# Patient Record
Sex: Female | Born: 1935 | Race: White | Hispanic: No | Marital: Married | Smoking: Never smoker
Health system: Southern US, Community
[De-identification: ages and names within clinical notes are randomized; demographics above are authoritative.]

## PROBLEM LIST (undated history)

## (undated) DIAGNOSIS — W19XXXA Unspecified fall, initial encounter: Secondary | ICD-10-CM

## (undated) DIAGNOSIS — S52209A Unspecified fracture of shaft of unspecified ulna, initial encounter for closed fracture: Secondary | ICD-10-CM

## (undated) DIAGNOSIS — M179 Osteoarthritis of knee, unspecified: Secondary | ICD-10-CM

## (undated) DIAGNOSIS — S62102A Fracture of unspecified carpal bone, left wrist, initial encounter for closed fracture: Secondary | ICD-10-CM

## (undated) DIAGNOSIS — M1710 Unilateral primary osteoarthritis, unspecified knee: Secondary | ICD-10-CM

## (undated) DIAGNOSIS — M4800 Spinal stenosis, site unspecified: Secondary | ICD-10-CM

## (undated) DIAGNOSIS — M419 Scoliosis, unspecified: Secondary | ICD-10-CM

## (undated) DIAGNOSIS — M7582 Other shoulder lesions, left shoulder: Secondary | ICD-10-CM

## (undated) DIAGNOSIS — Y92009 Unspecified place in unspecified non-institutional (private) residence as the place of occurrence of the external cause: Secondary | ICD-10-CM

## (undated) DIAGNOSIS — J449 Chronic obstructive pulmonary disease, unspecified: Secondary | ICD-10-CM

## (undated) DIAGNOSIS — S300XXA Contusion of lower back and pelvis, initial encounter: Secondary | ICD-10-CM

## (undated) HISTORY — PX: TONSILLECTOMY: SUR1361

## (undated) HISTORY — DX: Spinal stenosis, site unspecified: M48.00

## (undated) HISTORY — PX: NASAL SINUS SURGERY: SHX719

## (undated) HISTORY — DX: Unilateral primary osteoarthritis, unspecified knee: M17.10

## (undated) HISTORY — DX: Scoliosis, unspecified: M41.9

## (undated) HISTORY — DX: Chronic obstructive pulmonary disease, unspecified: J44.9

## (undated) HISTORY — PX: CATARACT EXTRACTION: SUR2

## (undated) HISTORY — DX: Osteoarthritis of knee, unspecified: M17.9

---

## 1999-03-06 HISTORY — PX: TOTAL KNEE ARTHROPLASTY: SHX125

## 2003-03-06 HISTORY — PX: APPENDECTOMY: SHX54

## 2011-04-06 DIAGNOSIS — J449 Chronic obstructive pulmonary disease, unspecified: Secondary | ICD-10-CM | POA: Diagnosis not present

## 2011-10-10 DIAGNOSIS — H04129 Dry eye syndrome of unspecified lacrimal gland: Secondary | ICD-10-CM | POA: Diagnosis not present

## 2011-10-10 DIAGNOSIS — Z961 Presence of intraocular lens: Secondary | ICD-10-CM | POA: Diagnosis not present

## 2011-10-10 DIAGNOSIS — H43819 Vitreous degeneration, unspecified eye: Secondary | ICD-10-CM | POA: Diagnosis not present

## 2011-10-31 DIAGNOSIS — E785 Hyperlipidemia, unspecified: Secondary | ICD-10-CM | POA: Diagnosis not present

## 2011-10-31 DIAGNOSIS — R269 Unspecified abnormalities of gait and mobility: Secondary | ICD-10-CM | POA: Diagnosis not present

## 2011-10-31 DIAGNOSIS — Z0000 Encounter for general adult medical examination without abnormal findings: Secondary | ICD-10-CM | POA: Diagnosis not present

## 2011-10-31 DIAGNOSIS — J309 Allergic rhinitis, unspecified: Secondary | ICD-10-CM | POA: Diagnosis not present

## 2011-10-31 DIAGNOSIS — M48061 Spinal stenosis, lumbar region without neurogenic claudication: Secondary | ICD-10-CM | POA: Diagnosis not present

## 2011-10-31 DIAGNOSIS — J45909 Unspecified asthma, uncomplicated: Secondary | ICD-10-CM | POA: Diagnosis not present

## 2011-11-07 DIAGNOSIS — M810 Age-related osteoporosis without current pathological fracture: Secondary | ICD-10-CM | POA: Diagnosis not present

## 2011-11-09 DIAGNOSIS — H6120 Impacted cerumen, unspecified ear: Secondary | ICD-10-CM | POA: Diagnosis not present

## 2011-11-09 DIAGNOSIS — M810 Age-related osteoporosis without current pathological fracture: Secondary | ICD-10-CM | POA: Diagnosis not present

## 2011-11-23 DIAGNOSIS — M79609 Pain in unspecified limb: Secondary | ICD-10-CM | POA: Diagnosis not present

## 2011-11-23 DIAGNOSIS — S93609A Unspecified sprain of unspecified foot, initial encounter: Secondary | ICD-10-CM | POA: Diagnosis not present

## 2011-12-07 DIAGNOSIS — Z23 Encounter for immunization: Secondary | ICD-10-CM | POA: Diagnosis not present

## 2012-05-07 DIAGNOSIS — J309 Allergic rhinitis, unspecified: Secondary | ICD-10-CM | POA: Diagnosis not present

## 2012-05-07 DIAGNOSIS — E785 Hyperlipidemia, unspecified: Secondary | ICD-10-CM | POA: Diagnosis not present

## 2012-05-07 DIAGNOSIS — J45909 Unspecified asthma, uncomplicated: Secondary | ICD-10-CM | POA: Diagnosis not present

## 2012-05-07 DIAGNOSIS — R1310 Dysphagia, unspecified: Secondary | ICD-10-CM | POA: Diagnosis not present

## 2012-05-14 DIAGNOSIS — R1310 Dysphagia, unspecified: Secondary | ICD-10-CM | POA: Diagnosis not present

## 2012-08-29 DIAGNOSIS — M4120 Other idiopathic scoliosis, site unspecified: Secondary | ICD-10-CM | POA: Diagnosis not present

## 2012-08-29 DIAGNOSIS — R269 Unspecified abnormalities of gait and mobility: Secondary | ICD-10-CM | POA: Diagnosis not present

## 2012-08-29 DIAGNOSIS — M48062 Spinal stenosis, lumbar region with neurogenic claudication: Secondary | ICD-10-CM | POA: Diagnosis not present

## 2012-08-29 DIAGNOSIS — M47817 Spondylosis without myelopathy or radiculopathy, lumbosacral region: Secondary | ICD-10-CM | POA: Diagnosis not present

## 2012-08-29 DIAGNOSIS — M545 Low back pain, unspecified: Secondary | ICD-10-CM | POA: Diagnosis not present

## 2012-09-14 DIAGNOSIS — S62309A Unspecified fracture of unspecified metacarpal bone, initial encounter for closed fracture: Secondary | ICD-10-CM | POA: Diagnosis not present

## 2012-09-14 DIAGNOSIS — S62329A Displaced fracture of shaft of unspecified metacarpal bone, initial encounter for closed fracture: Secondary | ICD-10-CM | POA: Diagnosis not present

## 2012-09-18 DIAGNOSIS — M25549 Pain in joints of unspecified hand: Secondary | ICD-10-CM | POA: Diagnosis not present

## 2012-09-25 DIAGNOSIS — M25549 Pain in joints of unspecified hand: Secondary | ICD-10-CM | POA: Diagnosis not present

## 2012-09-29 DIAGNOSIS — S62329A Displaced fracture of shaft of unspecified metacarpal bone, initial encounter for closed fracture: Secondary | ICD-10-CM | POA: Diagnosis not present

## 2012-09-29 DIAGNOSIS — IMO0001 Reserved for inherently not codable concepts without codable children: Secondary | ICD-10-CM | POA: Diagnosis not present

## 2012-10-02 DIAGNOSIS — S62329A Displaced fracture of shaft of unspecified metacarpal bone, initial encounter for closed fracture: Secondary | ICD-10-CM | POA: Diagnosis not present

## 2012-10-15 DIAGNOSIS — H43819 Vitreous degeneration, unspecified eye: Secondary | ICD-10-CM | POA: Diagnosis not present

## 2012-10-15 DIAGNOSIS — H04129 Dry eye syndrome of unspecified lacrimal gland: Secondary | ICD-10-CM | POA: Diagnosis not present

## 2012-10-15 DIAGNOSIS — Z961 Presence of intraocular lens: Secondary | ICD-10-CM | POA: Diagnosis not present

## 2012-10-24 DIAGNOSIS — Z4789 Encounter for other orthopedic aftercare: Secondary | ICD-10-CM | POA: Diagnosis not present

## 2012-10-24 DIAGNOSIS — S62329A Displaced fracture of shaft of unspecified metacarpal bone, initial encounter for closed fracture: Secondary | ICD-10-CM | POA: Diagnosis not present

## 2012-10-29 DIAGNOSIS — Z4789 Encounter for other orthopedic aftercare: Secondary | ICD-10-CM | POA: Diagnosis not present

## 2012-11-04 DIAGNOSIS — G4700 Insomnia, unspecified: Secondary | ICD-10-CM | POA: Diagnosis not present

## 2012-11-04 DIAGNOSIS — M1990 Unspecified osteoarthritis, unspecified site: Secondary | ICD-10-CM | POA: Diagnosis not present

## 2012-11-04 DIAGNOSIS — R269 Unspecified abnormalities of gait and mobility: Secondary | ICD-10-CM | POA: Diagnosis not present

## 2012-11-04 DIAGNOSIS — E785 Hyperlipidemia, unspecified: Secondary | ICD-10-CM | POA: Diagnosis not present

## 2012-11-04 DIAGNOSIS — J45909 Unspecified asthma, uncomplicated: Secondary | ICD-10-CM | POA: Diagnosis not present

## 2012-11-04 DIAGNOSIS — K224 Dyskinesia of esophagus: Secondary | ICD-10-CM | POA: Diagnosis not present

## 2012-11-04 DIAGNOSIS — Z0000 Encounter for general adult medical examination without abnormal findings: Secondary | ICD-10-CM | POA: Diagnosis not present

## 2012-11-04 DIAGNOSIS — J309 Allergic rhinitis, unspecified: Secondary | ICD-10-CM | POA: Diagnosis not present

## 2012-11-04 DIAGNOSIS — M549 Dorsalgia, unspecified: Secondary | ICD-10-CM | POA: Diagnosis not present

## 2012-11-06 DIAGNOSIS — M25569 Pain in unspecified knee: Secondary | ICD-10-CM | POA: Diagnosis not present

## 2012-11-06 DIAGNOSIS — Z471 Aftercare following joint replacement surgery: Secondary | ICD-10-CM | POA: Diagnosis not present

## 2012-11-06 DIAGNOSIS — T8489XA Other specified complication of internal orthopedic prosthetic devices, implants and grafts, initial encounter: Secondary | ICD-10-CM | POA: Diagnosis not present

## 2012-11-06 DIAGNOSIS — Z96659 Presence of unspecified artificial knee joint: Secondary | ICD-10-CM | POA: Diagnosis not present

## 2012-11-12 DIAGNOSIS — Z23 Encounter for immunization: Secondary | ICD-10-CM | POA: Diagnosis not present

## 2012-11-25 DIAGNOSIS — R269 Unspecified abnormalities of gait and mobility: Secondary | ICD-10-CM | POA: Diagnosis not present

## 2012-11-25 DIAGNOSIS — IMO0001 Reserved for inherently not codable concepts without codable children: Secondary | ICD-10-CM | POA: Diagnosis not present

## 2012-11-27 DIAGNOSIS — IMO0001 Reserved for inherently not codable concepts without codable children: Secondary | ICD-10-CM | POA: Diagnosis not present

## 2012-11-27 DIAGNOSIS — R269 Unspecified abnormalities of gait and mobility: Secondary | ICD-10-CM | POA: Diagnosis not present

## 2012-12-01 DIAGNOSIS — IMO0001 Reserved for inherently not codable concepts without codable children: Secondary | ICD-10-CM | POA: Diagnosis not present

## 2012-12-01 DIAGNOSIS — R269 Unspecified abnormalities of gait and mobility: Secondary | ICD-10-CM | POA: Diagnosis not present

## 2012-12-04 DIAGNOSIS — R269 Unspecified abnormalities of gait and mobility: Secondary | ICD-10-CM | POA: Diagnosis not present

## 2012-12-04 DIAGNOSIS — IMO0001 Reserved for inherently not codable concepts without codable children: Secondary | ICD-10-CM | POA: Diagnosis not present

## 2012-12-16 DIAGNOSIS — R269 Unspecified abnormalities of gait and mobility: Secondary | ICD-10-CM | POA: Diagnosis not present

## 2012-12-16 DIAGNOSIS — IMO0001 Reserved for inherently not codable concepts without codable children: Secondary | ICD-10-CM | POA: Diagnosis not present

## 2012-12-18 DIAGNOSIS — M25559 Pain in unspecified hip: Secondary | ICD-10-CM | POA: Diagnosis not present

## 2012-12-18 DIAGNOSIS — R269 Unspecified abnormalities of gait and mobility: Secondary | ICD-10-CM | POA: Diagnosis not present

## 2012-12-18 DIAGNOSIS — M6281 Muscle weakness (generalized): Secondary | ICD-10-CM | POA: Diagnosis not present

## 2012-12-18 DIAGNOSIS — M169 Osteoarthritis of hip, unspecified: Secondary | ICD-10-CM | POA: Diagnosis not present

## 2012-12-18 DIAGNOSIS — IMO0001 Reserved for inherently not codable concepts without codable children: Secondary | ICD-10-CM | POA: Diagnosis not present

## 2012-12-18 DIAGNOSIS — M1610 Unilateral primary osteoarthritis, unspecified hip: Secondary | ICD-10-CM | POA: Diagnosis not present

## 2012-12-23 DIAGNOSIS — R269 Unspecified abnormalities of gait and mobility: Secondary | ICD-10-CM | POA: Diagnosis not present

## 2012-12-23 DIAGNOSIS — IMO0001 Reserved for inherently not codable concepts without codable children: Secondary | ICD-10-CM | POA: Diagnosis not present

## 2012-12-25 DIAGNOSIS — R269 Unspecified abnormalities of gait and mobility: Secondary | ICD-10-CM | POA: Diagnosis not present

## 2012-12-25 DIAGNOSIS — IMO0001 Reserved for inherently not codable concepts without codable children: Secondary | ICD-10-CM | POA: Diagnosis not present

## 2012-12-29 DIAGNOSIS — R269 Unspecified abnormalities of gait and mobility: Secondary | ICD-10-CM | POA: Diagnosis not present

## 2012-12-29 DIAGNOSIS — IMO0001 Reserved for inherently not codable concepts without codable children: Secondary | ICD-10-CM | POA: Diagnosis not present

## 2013-01-01 DIAGNOSIS — IMO0001 Reserved for inherently not codable concepts without codable children: Secondary | ICD-10-CM | POA: Diagnosis not present

## 2013-01-01 DIAGNOSIS — R269 Unspecified abnormalities of gait and mobility: Secondary | ICD-10-CM | POA: Diagnosis not present

## 2013-01-02 DIAGNOSIS — M6281 Muscle weakness (generalized): Secondary | ICD-10-CM | POA: Diagnosis not present

## 2013-01-02 DIAGNOSIS — M5126 Other intervertebral disc displacement, lumbar region: Secondary | ICD-10-CM | POA: Diagnosis not present

## 2013-01-02 DIAGNOSIS — M6250 Muscle wasting and atrophy, not elsewhere classified, unspecified site: Secondary | ICD-10-CM | POA: Diagnosis not present

## 2013-01-02 DIAGNOSIS — M519 Unspecified thoracic, thoracolumbar and lumbosacral intervertebral disc disorder: Secondary | ICD-10-CM | POA: Diagnosis not present

## 2013-01-05 DIAGNOSIS — R269 Unspecified abnormalities of gait and mobility: Secondary | ICD-10-CM | POA: Diagnosis not present

## 2013-01-05 DIAGNOSIS — IMO0001 Reserved for inherently not codable concepts without codable children: Secondary | ICD-10-CM | POA: Diagnosis not present

## 2013-01-08 DIAGNOSIS — R269 Unspecified abnormalities of gait and mobility: Secondary | ICD-10-CM | POA: Diagnosis not present

## 2013-01-08 DIAGNOSIS — IMO0001 Reserved for inherently not codable concepts without codable children: Secondary | ICD-10-CM | POA: Diagnosis not present

## 2013-04-02 DIAGNOSIS — M47817 Spondylosis without myelopathy or radiculopathy, lumbosacral region: Secondary | ICD-10-CM | POA: Diagnosis not present

## 2013-04-02 DIAGNOSIS — M533 Sacrococcygeal disorders, not elsewhere classified: Secondary | ICD-10-CM | POA: Diagnosis not present

## 2013-04-02 DIAGNOSIS — IMO0002 Reserved for concepts with insufficient information to code with codable children: Secondary | ICD-10-CM | POA: Diagnosis not present

## 2013-04-02 DIAGNOSIS — J449 Chronic obstructive pulmonary disease, unspecified: Secondary | ICD-10-CM | POA: Diagnosis not present

## 2013-04-02 DIAGNOSIS — M4120 Other idiopathic scoliosis, site unspecified: Secondary | ICD-10-CM | POA: Diagnosis not present

## 2013-04-02 DIAGNOSIS — R269 Unspecified abnormalities of gait and mobility: Secondary | ICD-10-CM | POA: Diagnosis not present

## 2013-06-17 DIAGNOSIS — M549 Dorsalgia, unspecified: Secondary | ICD-10-CM | POA: Diagnosis not present

## 2013-06-17 DIAGNOSIS — R269 Unspecified abnormalities of gait and mobility: Secondary | ICD-10-CM | POA: Diagnosis not present

## 2013-06-17 DIAGNOSIS — M1990 Unspecified osteoarthritis, unspecified site: Secondary | ICD-10-CM | POA: Diagnosis not present

## 2013-06-17 DIAGNOSIS — E785 Hyperlipidemia, unspecified: Secondary | ICD-10-CM | POA: Diagnosis not present

## 2013-06-24 DIAGNOSIS — H6120 Impacted cerumen, unspecified ear: Secondary | ICD-10-CM | POA: Diagnosis not present

## 2013-06-24 DIAGNOSIS — H905 Unspecified sensorineural hearing loss: Secondary | ICD-10-CM | POA: Diagnosis not present

## 2013-10-22 ENCOUNTER — Ambulatory Visit (INDEPENDENT_AMBULATORY_CARE_PROVIDER_SITE_OTHER): Payer: Medicare Other | Admitting: Physician Assistant

## 2013-10-22 ENCOUNTER — Encounter: Payer: Self-pay | Admitting: Physician Assistant

## 2013-10-22 VITALS — BP 118/78 | HR 75 | Temp 98.2°F | Resp 14 | Ht 62.0 in | Wt 166.0 lb

## 2013-10-22 DIAGNOSIS — M949 Disorder of cartilage, unspecified: Secondary | ICD-10-CM

## 2013-10-22 DIAGNOSIS — M899 Disorder of bone, unspecified: Secondary | ICD-10-CM | POA: Diagnosis not present

## 2013-10-22 DIAGNOSIS — R269 Unspecified abnormalities of gait and mobility: Secondary | ICD-10-CM | POA: Diagnosis not present

## 2013-10-22 DIAGNOSIS — R2681 Unsteadiness on feet: Secondary | ICD-10-CM

## 2013-10-22 DIAGNOSIS — J449 Chronic obstructive pulmonary disease, unspecified: Secondary | ICD-10-CM | POA: Diagnosis not present

## 2013-10-22 DIAGNOSIS — M8580 Other specified disorders of bone density and structure, unspecified site: Secondary | ICD-10-CM

## 2013-10-22 NOTE — Progress Notes (Signed)
Pre visit review using our clinic review tool, if applicable. No additional management support is needed unless otherwise documented below in the visit note/SLS  

## 2013-10-22 NOTE — Progress Notes (Signed)
Patient presents to clinic today to establish care.  Chronic Issues: COPD, mild -- Advair inhaler use twice daily. Has albuterol at home.  Does not require it. Was previously followed by Pulmonology in [place].  Does not wish to see specialist at present.  Osteopenia -- Currently on Calcium-D supplementation.  Is due for DEXA scan. Wishes to hold off until her physical in September.  Patient also taking a glucosamine-chondroitin supplement for joint health with good results.   Gait Instability -- chronic.  Patient has had workup in the past.  Instability is due to significant Scoliosis and leg weakness due to mild spinal stenosis.  Denies vision changes, dizziness or lightheadedness.  Denies history of stroke or heart attack.  Was about to restart PT in [place] before their move.  Is requesting a PT evaluation her at [facility].  Health Maintenance: Dental -- up-to-date Vision -- up-to-date.  Will refer to Dr. Bing Plume here in the area. Immunizations -- Will obtain records from previous PMD as patient is unsure. Colonoscopy -- Due in 2015.   Mammogram -- Last in 2013.   Bone Density --  Overdue. Declines order today.   Past Medical History  Diagnosis Date  . COPD (chronic obstructive pulmonary disease)     Mild  . Arthritis, rheumatoid     Spine  . Spinal stenosis   . Scoliosis   . DJD (degenerative joint disease) of knee     Past Surgical History  Procedure Laterality Date  . Total knee arthroplasty  2001    Left  . Appendectomy  2005  . Cataract extraction      Bilateral  . Nasal sinus surgery    . Tonsillectomy      No current outpatient prescriptions on file prior to visit.   No current facility-administered medications on file prior to visit.    Allergies  Allergen Reactions  . Amoxicillin Nausea And Vomiting  . Ibuprofen Swelling    Mouth Swelling     Family History  Problem Relation Age of Onset  . Heart disease Mother 62    Deceased  . Diabetes Mother    . Heart disease Father 77    Deceased  . Asthma Father   . Heart disease Maternal Uncle   . Colon cancer Brother   . Diabetes Brother     #2  . Heart disease Brother     x2  . Healthy Sister   . Healthy Brother     x1  . Hyperlipidemia Son     x2    History   Social History  . Marital Status: Married    Spouse Name: N/A    Number of Children: N/A  . Years of Education: N/A   Occupational History  . Not on file.   Social History Main Topics  . Smoking status: Never Smoker   . Smokeless tobacco: Not on file  . Alcohol Use: Not on file  . Drug Use: Not on file  . Sexual Activity: Not on file   Other Topics Concern  . Not on file   Social History Narrative  . No narrative on file   ROS See HPI.  All other ROS are negative.  BP 118/78  Pulse 75  Temp(Src) 98.2 F (36.8 C) (Oral)  Resp 14  Ht 5\' 2"  (1.575 m)  Wt 166 lb (75.297 kg)  BMI 30.35 kg/m2  SpO2 97%  Physical Exam  Vitals reviewed. Constitutional: She is oriented to person, place, and time and well-developed,  well-nourished, and in no distress.  HENT:  Head: Normocephalic and atraumatic.  Right Ear: External ear normal.  Left Ear: External ear normal.  Nose: Nose normal.  Mouth/Throat: Oropharynx is clear and moist. No oropharyngeal exudate.  TM within normal limits bilaterally.  Eyes: Conjunctivae are normal.  Neck: Neck supple. No thyromegaly present.  Cardiovascular: Normal rate, regular rhythm, normal heart sounds and intact distal pulses.   Pulmonary/Chest: Effort normal and breath sounds normal. No respiratory distress. She has no wheezes. She has no rales. She exhibits no tenderness.  Lymphadenopathy:    She has no cervical adenopathy.  Neurological: She is alert and oriented to person, place, and time.  Weakness noted on lower extremities with ambulation. Strength 3/5 bilaterally.  Skin: Skin is warm and dry. No rash noted.   Assessment/Plan: Gait instability Will refer downstairs  for continued PT.   Osteopenia Continue supplements.  Patient due for DEXA but declines at present.  COPD with chronic bronchitis Continue current medication regimen.  Patient to reconsider Pulmonology referral.

## 2013-10-22 NOTE — Patient Instructions (Signed)
Please continue medications as directed.  We will obtain your records from your previous Primary Medical Doctor.  You will be contacted by [facility] for an appointment.  Please follow-up with me in September for your Annual Exam.

## 2013-10-27 ENCOUNTER — Telehealth: Payer: Self-pay | Admitting: Physician Assistant

## 2013-10-27 NOTE — Telephone Encounter (Signed)
Received medical records from [facility] in [place], [place]

## 2013-11-02 DIAGNOSIS — J449 Chronic obstructive pulmonary disease, unspecified: Secondary | ICD-10-CM | POA: Insufficient documentation

## 2013-11-02 DIAGNOSIS — R2681 Unsteadiness on feet: Secondary | ICD-10-CM | POA: Insufficient documentation

## 2013-11-02 DIAGNOSIS — M8580 Other specified disorders of bone density and structure, unspecified site: Secondary | ICD-10-CM | POA: Insufficient documentation

## 2013-11-02 NOTE — Assessment & Plan Note (Signed)
Continue supplements.  Patient due for DEXA but declines at present.

## 2013-11-02 NOTE — Assessment & Plan Note (Signed)
Continue current medication regimen.  Patient to reconsider Pulmonology referral.

## 2013-11-02 NOTE — Assessment & Plan Note (Signed)
Will refer downstairs for continued PT.

## 2013-11-04 ENCOUNTER — Telehealth: Payer: Self-pay | Admitting: Physician Assistant

## 2013-11-04 NOTE — Telephone Encounter (Signed)
Records received from [facility] clinic

## 2013-11-24 ENCOUNTER — Telehealth: Payer: Self-pay

## 2013-11-24 DIAGNOSIS — R5383 Other fatigue: Secondary | ICD-10-CM

## 2013-11-24 DIAGNOSIS — E785 Hyperlipidemia, unspecified: Secondary | ICD-10-CM

## 2013-11-24 DIAGNOSIS — Z299 Encounter for prophylactic measures, unspecified: Secondary | ICD-10-CM

## 2013-11-24 DIAGNOSIS — J42 Unspecified chronic bronchitis: Secondary | ICD-10-CM

## 2013-11-24 NOTE — Telephone Encounter (Signed)
This patient is scheduled for labs tomorrow. What would like ordered?

## 2013-11-24 NOTE — Telephone Encounter (Signed)
Orders placed above. Please make sure the lab appointment is scheduled.

## 2013-11-25 ENCOUNTER — Other Ambulatory Visit (INDEPENDENT_AMBULATORY_CARE_PROVIDER_SITE_OTHER): Payer: Medicare Other

## 2013-11-25 DIAGNOSIS — J42 Unspecified chronic bronchitis: Secondary | ICD-10-CM

## 2013-11-25 DIAGNOSIS — Z299 Encounter for prophylactic measures, unspecified: Secondary | ICD-10-CM | POA: Diagnosis not present

## 2013-11-25 DIAGNOSIS — R5381 Other malaise: Secondary | ICD-10-CM | POA: Diagnosis not present

## 2013-11-25 DIAGNOSIS — R5383 Other fatigue: Secondary | ICD-10-CM | POA: Diagnosis not present

## 2013-11-25 DIAGNOSIS — Z79899 Other long term (current) drug therapy: Secondary | ICD-10-CM | POA: Diagnosis not present

## 2013-11-25 DIAGNOSIS — E785 Hyperlipidemia, unspecified: Secondary | ICD-10-CM | POA: Diagnosis not present

## 2013-11-25 LAB — CBC
HCT: 42.4 % (ref 36.0–46.0)
HEMOGLOBIN: 14.3 g/dL (ref 12.0–15.0)
MCHC: 33.8 g/dL (ref 30.0–36.0)
MCV: 89.7 fl (ref 78.0–100.0)
Platelets: 286 10*3/uL (ref 150.0–400.0)
RBC: 4.73 Mil/uL (ref 3.87–5.11)
RDW: 13.7 % (ref 11.5–15.5)
WBC: 5.5 10*3/uL (ref 4.0–10.5)

## 2013-11-25 LAB — TSH: TSH: 1.15 u[IU]/mL (ref 0.35–4.50)

## 2013-11-25 LAB — BASIC METABOLIC PANEL
BUN: 19 mg/dL (ref 6–23)
CO2: 30 mEq/L (ref 19–32)
Calcium: 9.5 mg/dL (ref 8.4–10.5)
Chloride: 100 mEq/L (ref 96–112)
Creatinine, Ser: 0.6 mg/dL (ref 0.4–1.2)
GFR: 98.65 mL/min (ref >60.00)
Glucose, Bld: 79 mg/dL (ref 70–99)
Potassium: 3.9 mEq/L (ref 3.5–5.1)
SODIUM: 135 meq/L (ref 135–145)

## 2013-11-25 LAB — HEPATIC FUNCTION PANEL
ALK PHOS: 79 U/L (ref 39–117)
ALT: 16 U/L (ref 0–35)
AST: 25 U/L (ref 0–37)
Albumin: 3.8 g/dL (ref 3.5–5.2)
Bilirubin, Direct: 0 mg/dL (ref 0.0–0.3)
Total Bilirubin: 0.2 mg/dL (ref 0.2–1.2)
Total Protein: 7.8 g/dL (ref 6.0–8.3)

## 2013-11-25 LAB — LIPID PANEL
Cholesterol: 224 mg/dL — ABNORMAL HIGH (ref 0–200)
HDL: 48.8 mg/dL (ref >39.00)
LDL Cholesterol: 153 mg/dL — ABNORMAL HIGH (ref 0–99)
NonHDL: 175.2
TRIGLYCERIDES: 109 mg/dL (ref 0.0–149.0)
Total CHOL/HDL Ratio: 5
VLDL: 21.8 mg/dL (ref 0.0–40.0)

## 2013-11-25 LAB — VITAMIN D 25 HYDROXY (VIT D DEFICIENCY, FRACTURES): VITD: 56 ng/mL (ref 30.00–100.00)

## 2013-11-25 LAB — HEMOGLOBIN A1C: HEMOGLOBIN A1C: 5.6 % (ref 4.6–6.5)

## 2013-11-25 NOTE — Telephone Encounter (Signed)
Patient seen today

## 2013-11-26 ENCOUNTER — Telehealth: Payer: Self-pay | Admitting: Physician Assistant

## 2013-11-26 LAB — URINALYSIS W MICROSCOPIC + REFLEX CULTURE
BACTERIA UA: NONE SEEN
Bilirubin Urine: NEGATIVE
CRYSTALS: NONE SEEN
Casts: NONE SEEN
Glucose, UA: NEGATIVE mg/dL
HGB URINE DIPSTICK: NEGATIVE
KETONES UR: NEGATIVE mg/dL
NITRITE: NEGATIVE
Protein, ur: NEGATIVE mg/dL
SPECIFIC GRAVITY, URINE: 1.015 (ref 1.005–1.030)
Squamous Epithelial / LPF: NONE SEEN
UROBILINOGEN UA: 0.2 mg/dL (ref 0.0–1.0)
pH: 7 (ref 5.0–8.0)

## 2013-11-26 NOTE — Telephone Encounter (Signed)
Will discuss lab results at CPE.  However, urine shows evidence of infection. Patient without symptoms at appointment..  Please send urine for culture.  Call patient to assess for symptoms of UTI.  If present, let me know so empiric antibiotics can be started.

## 2013-11-26 NOTE — Telephone Encounter (Signed)
Patient states that she is asymptomatic. No frequency,burning,pain or discoloration. Patient will call back if she notices anything.

## 2013-11-29 LAB — URINE CULTURE

## 2013-11-30 ENCOUNTER — Encounter: Payer: Self-pay | Admitting: Physician Assistant

## 2013-11-30 ENCOUNTER — Telehealth: Payer: Self-pay | Admitting: Physician Assistant

## 2013-11-30 ENCOUNTER — Ambulatory Visit (INDEPENDENT_AMBULATORY_CARE_PROVIDER_SITE_OTHER): Payer: Medicare Other | Admitting: Physician Assistant

## 2013-11-30 VITALS — BP 125/54 | HR 78 | Temp 98.3°F | Resp 16 | Ht 62.0 in | Wt 165.4 lb

## 2013-11-30 DIAGNOSIS — R2681 Unsteadiness on feet: Secondary | ICD-10-CM

## 2013-11-30 DIAGNOSIS — Z136 Encounter for screening for cardiovascular disorders: Secondary | ICD-10-CM | POA: Diagnosis not present

## 2013-11-30 DIAGNOSIS — Z0000 Encounter for general adult medical examination without abnormal findings: Secondary | ICD-10-CM

## 2013-11-30 DIAGNOSIS — M8580 Other specified disorders of bone density and structure, unspecified site: Secondary | ICD-10-CM

## 2013-11-30 DIAGNOSIS — Z23 Encounter for immunization: Secondary | ICD-10-CM | POA: Diagnosis not present

## 2013-11-30 DIAGNOSIS — M899 Disorder of bone, unspecified: Secondary | ICD-10-CM

## 2013-11-30 DIAGNOSIS — Z1211 Encounter for screening for malignant neoplasm of colon: Secondary | ICD-10-CM

## 2013-11-30 DIAGNOSIS — Z1239 Encounter for other screening for malignant neoplasm of breast: Secondary | ICD-10-CM

## 2013-11-30 DIAGNOSIS — M949 Disorder of cartilage, unspecified: Secondary | ICD-10-CM

## 2013-11-30 DIAGNOSIS — N3000 Acute cystitis without hematuria: Secondary | ICD-10-CM

## 2013-11-30 DIAGNOSIS — R269 Unspecified abnormalities of gait and mobility: Secondary | ICD-10-CM

## 2013-11-30 MED ORDER — LEVOFLOXACIN 250 MG PO TABS: 250.0000 mg | ORAL_TABLET | Freq: Every day | ORAL | Status: DC

## 2013-11-30 NOTE — Addendum Note (Signed)
Addended by: Rockwell Germany on: 11/30/2013 04:40 PM   Modules accepted: Orders

## 2013-11-30 NOTE — Progress Notes (Signed)
Subjective:   Patient here for Medicare annual wellness visit and management of other chronic and acute problems.      COPD, mild -- Advair inhaler use twice daily. Has albuterol at home. Does not require it. Was previously followed by Pulmonology in [place]. Does not wish to see specialist at present.  Osteopenia -- Currently on Calcium-D supplementation. Is due for DEXA scan.   Gait Instability -- chronic. Patient has had workup in the past. Instability is due to significant Scoliosis and leg weakness due to mild spinal stenosis. Denies vision changes, dizziness or lightheadedness. Denies history of stroke or heart attack. Is currently undergoing PT here at the [facility] with improvement in gait.  Risk factors: Age, COPD, Osteopenia  Roster of Physicians Providing Medical Care to Patient: (1) Brunetta Jeans, PA-C -- Primary Care (2) Dr. Bing Plume -- Opthalmology   Activities of Daily Living  In your present state of health, do you have any difficulty performing the following activities? Preparing food and eating?: No  Bathing yourself: No  Getting dressed: No  Using the toilet:No  Moving around from place to place: No  In the past year have you fallen or had a near fall?:No     Home Safety: Has smoke detector and wears seat belts. No firearms. No excess sun exposure.  Diet and Exercise  Current exercise habits: Water aerobics 3-4 times per week. Dietary issues discussed: healthy diet   Health Maintenance:  Dental -- up-to-date  Vision -- up-to-date. Sees [facility]. Immunizations -- Will obtain records from previous PMD as patient is unsure.  Colonoscopy -- Family history of CRC.  Will refer to GI. Mammogram -- Last in 2013.  Will place order for screening mammogram. Bone Density -- Overdue. Order placed.  Depression Screen  (Note: if answer to either of the following is "Yes", then a more complete depression screening is indicated)  Q1: Over the past two weeks,  have you felt down, depressed or hopeless? no  Q2: Over the past two weeks, have you felt little interest or pleasure in doing things? no   The following portions of the patient's history were reviewed and updated as appropriate: allergies, current medications, past family history, past medical history, past social history, past surgical history and problem list.   Review of Systems  Constitutional: Negative for fever and weight loss.  HENT: Negative for ear discharge, ear pain, hearing loss and tinnitus.   Respiratory: Negative for cough and shortness of breath.   Cardiovascular: Negative for chest pain.  Gastrointestinal: Negative for heartburn, nausea, vomiting, abdominal pain, diarrhea, constipation, blood in stool and melena.  Genitourinary: Negative for dysuria, urgency, frequency, hematuria and flank pain.  Neurological: Negative for headaches.  Psychiatric/Behavioral: Negative for depression. The patient is not nervous/anxious.    Objective:   Vision: L 20/25, R 20/25, B 20/15 uncorrected Hearing: Patient can hear a forced whisper from across the room. Body mass index: Body mass index is 30.24 kg/(m^2). Cognitive Impairment Assessment: cognition, memory and judgment appear normal.   Physical Exam  Vitals reviewed. Constitutional: She is oriented to person, place, and time and well-developed, well-nourished, and in no distress.  HENT:  Head: Normocephalic and atraumatic.  Right Ear: External ear normal.  Left Ear: External ear normal.  Nose: Nose normal.  Mouth/Throat: Oropharynx is clear and moist. No oropharyngeal exudate.  Eyes: Conjunctivae are normal. Pupils are equal, round, and reactive to light.  Neck: Neck supple. No thyromegaly present.  Cardiovascular: Normal rate, regular rhythm,  normal heart sounds and intact distal pulses.   Pulmonary/Chest: Effort normal and breath sounds normal. No respiratory distress. She has no wheezes. She has no rales. She exhibits no  tenderness.  Abdominal: Soft. Bowel sounds are normal. She exhibits no distension. There is no tenderness.  Lymphadenopathy:    She has no cervical adenopathy.  Neurological: She is alert and oriented to person, place, and time.  Skin: Skin is warm and dry. No rash noted.  Psychiatric: Affect normal.   Assessment:   (1) Medicare wellness utd on preventive parameters (2) COPD (3) Gait Instability (4) Osteopenia (5) Screening for Ischemic Heart Disease (6) Colon Cancer Screening (7) Breast Cancer Screening  Plan:   (1) During the course of the visit the patient was educated and counseled about appropriate screening and preventive services including: Fall prevention, Screening mammography, Bone densitometry screening, Diabetes screening, Nutrition counseling. Patient received a high-dose influenza vaccine at today's visit.  (2) Well-controlled. Lung exam within normal limits.  Continue current regimen.  (3) Improved with Water Aerobics.  New referral placed to PT for gait-strengthening exercises.  (4) Continue Calcium supplement.  Order placed for Bone Density test.  (5) EKG obtained revealing NSR.  (6) Order placed for screening colonoscopy giving + family history of CRC.   (7) Referral placed to GI for screening colonoscopy as concerning polyp noted 3 years ago.  Patient Instructions (the written plan) was given to the patient.

## 2013-11-30 NOTE — Addendum Note (Signed)
Addended by: Raiford Noble on: 11/30/2013 12:04 PM   Modules accepted: Orders

## 2013-11-30 NOTE — Progress Notes (Signed)
Pre visit review using our clinic review tool, if applicable. No additional management support is needed unless otherwise documented below in the visit note/SLS  

## 2013-11-30 NOTE — Patient Instructions (Signed)
Continue medications as directed. You will be contacted for Physical Therapy, Bone Density scanning, mammogram and colonoscopy giving your family history.  Follow-up with me in 6 months for a cholesterol recheck.  Please make sure to limit your intake of saturated fats and total cholesterol.  Continue water aerobics 3-4 times per week.  Preventive Care for Adults A healthy lifestyle and preventive care can promote health and wellness. Preventive health guidelines for women include the following key practices.  A routine yearly physical is a good way to check with your health care provider about your health and preventive screening. It is a chance to share any concerns and updates on your health and to receive a thorough exam.  Visit your dentist for a routine exam and preventive care every 6 months. Brush your teeth twice a day and floss once a day. Good oral hygiene prevents tooth decay and gum disease.  The frequency of eye exams is based on your age, health, family medical history, use of contact lenses, and other factors. Follow your health care provider's recommendations for frequency of eye exams.  Eat a healthy diet. Foods like vegetables, fruits, whole grains, low-fat dairy products, and lean protein foods contain the nutrients you need without too many calories. Decrease your intake of foods high in solid fats, added sugars, and salt. Eat the right amount of calories for you.Get information about a proper diet from your health care provider, if necessary.  Regular physical exercise is one of the most important things you can do for your health. Most adults should get at least 150 minutes of moderate-intensity exercise (any activity that increases your heart rate and causes you to sweat) each week. In addition, most adults need muscle-strengthening exercises on 2 or more days a week.  Maintain a healthy weight. The body mass index (BMI) is a screening tool to identify possible weight  problems. It provides an estimate of body fat based on height and weight. Your health care provider can find your BMI and can help you achieve or maintain a healthy weight.For adults 20 years and older:  A BMI below 18.5 is considered underweight.  A BMI of 18.5 to 24.9 is normal.  A BMI of 25 to 29.9 is considered overweight.  A BMI of 30 and above is considered obese.  Maintain normal blood lipids and cholesterol levels by exercising and minimizing your intake of saturated fat. Eat a balanced diet with plenty of fruit and vegetables. Blood tests for lipids and cholesterol should begin at age 41 and be repeated every 5 years. If your lipid or cholesterol levels are high, you are over 50, or you are at high risk for heart disease, you may need your cholesterol levels checked more frequently.Ongoing high lipid and cholesterol levels should be treated with medicines if diet and exercise are not working.  If you smoke, find out from your health care provider how to quit. If you do not use tobacco, do not start.  Lung cancer screening is recommended for adults aged 54-80 years who are at high risk for developing lung cancer because of a history of smoking. A yearly low-dose CT scan of the lungs is recommended for people who have at least a 30-pack-year history of smoking and are a current smoker or have quit within the past 15 years. A pack year of smoking is smoking an average of 1 pack of cigarettes a day for 1 year (for example: 1 pack a day for 30 years or 2  packs a day for 15 years). Yearly screening should continue until the smoker has stopped smoking for at least 15 years. Yearly screening should be stopped for people who develop a health problem that would prevent them from having lung cancer treatment.  If you are pregnant, do not drink alcohol. If you are breastfeeding, be very cautious about drinking alcohol. If you are not pregnant and choose to drink alcohol, do not have more than 1 drink  per day. One drink is considered to be 12 ounces (355 mL) of beer, 5 ounces (148 mL) of wine, or 1.5 ounces (44 mL) of liquor.  Avoid use of street drugs. Do not share needles with anyone. Ask for help if you need support or instructions about stopping the use of drugs.  High blood pressure causes heart disease and increases the risk of stroke. Your blood pressure should be checked at least every 1 to 2 years. Ongoing high blood pressure should be treated with medicines if weight loss and exercise do not work.  If you are 39-21 years old, ask your health care provider if you should take aspirin to prevent strokes.  Diabetes screening involves taking a blood sample to check your fasting blood sugar level. This should be done once every 3 years, after age 73, if you are within normal weight and without risk factors for diabetes. Testing should be considered at a younger age or be carried out more frequently if you are overweight and have at least 1 risk factor for diabetes.  Breast cancer screening is essential preventive care for women. You should practice "breast self-awareness." This means understanding the normal appearance and feel of your breasts and may include breast self-examination. Any changes detected, no matter how small, should be reported to a health care provider. Women in their 17s and 30s should have a clinical breast exam (CBE) by a health care provider as part of a regular health exam every 1 to 3 years. After age 54, women should have a CBE every year. Starting at age 80, women should consider having a mammogram (breast X-ray test) every year. Women who have a family history of breast cancer should talk to their health care provider about genetic screening. Women at a high risk of breast cancer should talk to their health care providers about having an MRI and a mammogram every year.  Breast cancer gene (BRCA)-related cancer risk assessment is recommended for women who have family  members with BRCA-related cancers. BRCA-related cancers include breast, ovarian, tubal, and peritoneal cancers. Having family members with these cancers may be associated with an increased risk for harmful changes (mutations) in the breast cancer genes BRCA1 and BRCA2. Results of the assessment will determine the need for genetic counseling and BRCA1 and BRCA2 testing.  Routine pelvic exams to screen for cancer are no longer recommended for nonpregnant women who are considered low risk for cancer of the pelvic organs (ovaries, uterus, and vagina) and who do not have symptoms. Ask your health care provider if a screening pelvic exam is right for you.  If you have had past treatment for cervical cancer or a condition that could lead to cancer, you need Pap tests and screening for cancer for at least 20 years after your treatment. If Pap tests have been discontinued, your risk factors (such as having a new sexual partner) need to be reassessed to determine if screening should be resumed. Some women have medical problems that increase the chance of getting cervical cancer. In  these cases, your health care provider may recommend more frequent screening and Pap tests.  The HPV test is an additional test that may be used for cervical cancer screening. The HPV test looks for the virus that can cause the cell changes on the cervix. The cells collected during the Pap test can be tested for HPV. The HPV test could be used to screen women aged 67 years and older, and should be used in women of any age who have unclear Pap test results. After the age of 9, women should have HPV testing at the same frequency as a Pap test.  Colorectal cancer can be detected and often prevented. Most routine colorectal cancer screening begins at the age of 69 years and continues through age 66 years. However, your health care provider may recommend screening at an earlier age if you have risk factors for colon cancer. On a yearly basis,  your health care provider may provide home test kits to check for hidden blood in the stool. Use of a small camera at the end of a tube, to directly examine the colon (sigmoidoscopy or colonoscopy), can detect the earliest forms of colorectal cancer. Talk to your health care provider about this at age 68, when routine screening begins. Direct exam of the colon should be repeated every 5-10 years through age 13 years, unless early forms of pre-cancerous polyps or small growths are found.  People who are at an increased risk for hepatitis B should be screened for this virus. You are considered at high risk for hepatitis B if:  You were born in a country where hepatitis B occurs often. Talk with your health care provider about which countries are considered high risk.  Your parents were born in a high-risk country and you have not received a shot to protect against hepatitis B (hepatitis B vaccine).  You have HIV or AIDS.  You use needles to inject street drugs.  You live with, or have sex with, someone who has hepatitis B.  You get hemodialysis treatment.  You take certain medicines for conditions like cancer, organ transplantation, and autoimmune conditions.  Hepatitis C blood testing is recommended for all people born from [birth year] through 1965 and any individual with known risks for hepatitis C.  Practice safe sex. Use condoms and avoid high-risk sexual practices to reduce the spread of sexually transmitted infections (STIs). STIs include gonorrhea, chlamydia, syphilis, trichomonas, herpes, HPV, and human immunodeficiency virus (HIV). Herpes, HIV, and HPV are viral illnesses that have no cure. They can result in disability, cancer, and death.  You should be screened for sexually transmitted illnesses (STIs) including gonorrhea and chlamydia if:  You are sexually active and are younger than 24 years.  You are older than 24 years and your health care provider tells you that you are at risk for  this type of infection.  Your sexual activity has changed since you were last screened and you are at an increased risk for chlamydia or gonorrhea. Ask your health care provider if you are at risk.  If you are at risk of being infected with HIV, it is recommended that you take a prescription medicine daily to prevent HIV infection. This is called preexposure prophylaxis (PrEP). You are considered at risk if:  You are a heterosexual woman, are sexually active, and are at increased risk for HIV infection.  You take drugs by injection.  You are sexually active with a partner who has HIV.  Talk with your health care  provider about whether you are at high risk of being infected with HIV. If you choose to begin PrEP, you should first be tested for HIV. You should then be tested every 3 months for as long as you are taking PrEP.  Osteoporosis is a disease in which the bones lose minerals and strength with aging. This can result in serious bone fractures or breaks. The risk of osteoporosis can be identified using a bone density scan. Women ages 73 years and over and women at risk for fractures or osteoporosis should discuss screening with their health care providers. Ask your health care provider whether you should take a calcium supplement or vitamin D to reduce the rate of osteoporosis.  Menopause can be associated with physical symptoms and risks. Hormone replacement therapy is available to decrease symptoms and risks. You should talk to your health care provider about whether hormone replacement therapy is right for you.  Use sunscreen. Apply sunscreen liberally and repeatedly throughout the day. You should seek shade when your shadow is shorter than you. Protect yourself by wearing long sleeves, pants, a wide-brimmed hat, and sunglasses year round, whenever you are outdoors.  Once a month, do a whole body skin exam, using a mirror to look at the skin on your back. Tell your health care provider of  new moles, moles that have irregular borders, moles that are larger than a pencil eraser, or moles that have changed in shape or color.  Stay current with required vaccines (immunizations).  Influenza vaccine. All adults should be immunized every year.  Tetanus, diphtheria, and acellular pertussis (Td, Tdap) vaccine. Pregnant women should receive 1 dose of Tdap vaccine during each pregnancy. The dose should be obtained regardless of the length of time since the last dose. Immunization is preferred during the 27th-36th week of gestation. An adult who has not previously received Tdap or who does not know her vaccine status should receive 1 dose of Tdap. This initial dose should be followed by tetanus and diphtheria toxoids (Td) booster doses every 10 years. Adults with an unknown or incomplete history of completing a 3-dose immunization series with Td-containing vaccines should begin or complete a primary immunization series including a Tdap dose. Adults should receive a Td booster every 10 years.  Varicella vaccine. An adult without evidence of immunity to varicella should receive 2 doses or a second dose if she has previously received 1 dose. Pregnant females who do not have evidence of immunity should receive the first dose after pregnancy. This first dose should be obtained before leaving the health care facility. The second dose should be obtained 4-8 weeks after the first dose.  Human papillomavirus (HPV) vaccine. Females aged 13-26 years who have not received the vaccine previously should obtain the 3-dose series. The vaccine is not recommended for use in pregnant females. However, pregnancy testing is not needed before receiving a dose. If a female is found to be pregnant after receiving a dose, no treatment is needed. In that case, the remaining doses should be delayed until after the pregnancy. Immunization is recommended for any person with an immunocompromised condition through the age of 51  years if she did not get any or all doses earlier. During the 3-dose series, the second dose should be obtained 4-8 weeks after the first dose. The third dose should be obtained 24 weeks after the first dose and 16 weeks after the second dose.  Zoster vaccine. One dose is recommended for adults aged 24 years or older  unless certain conditions are present.  Measles, mumps, and rubella (MMR) vaccine. Adults born before [birth year] generally are considered immune to measles and mumps. Adults born in [birth year] or later should have 1 or more doses of MMR vaccine unless there is a contraindication to the vaccine or there is laboratory evidence of immunity to each of the three diseases. A routine second dose of MMR vaccine should be obtained at least 28 days after the first dose for students attending postsecondary schools, health care workers, or international travelers. People who received inactivated measles vaccine or an unknown type of measles vaccine during 1963-1967 should receive 2 doses of MMR vaccine. People who received inactivated mumps vaccine or an unknown type of mumps vaccine before 1979 and are at high risk for mumps infection should consider immunization with 2 doses of MMR vaccine. For females of childbearing age, rubella immunity should be determined. If there is no evidence of immunity, females who are not pregnant should be vaccinated. If there is no evidence of immunity, females who are pregnant should delay immunization until after pregnancy. Unvaccinated health care workers born before [birth year] who lack laboratory evidence of measles, mumps, or rubella immunity or laboratory confirmation of disease should consider measles and mumps immunization with 2 doses of MMR vaccine or rubella immunization with 1 dose of MMR vaccine.  Pneumococcal 13-valent conjugate (PCV13) vaccine. When indicated, a person who is uncertain of her immunization history and has no record of immunization should receive the PCV13 vaccine.  An adult aged 13 years or older who has certain medical conditions and has not been previously immunized should receive 1 dose of PCV13 vaccine. This PCV13 should be followed with a dose of pneumococcal polysaccharide (PPSV23) vaccine. The PPSV23 vaccine dose should be obtained at least 8 weeks after the dose of PCV13 vaccine. An adult aged 35 years or older who has certain medical conditions and previously received 1 or more doses of PPSV23 vaccine should receive 1 dose of PCV13. The PCV13 vaccine dose should be obtained 1 or more years after the last PPSV23 vaccine dose.  Pneumococcal polysaccharide (PPSV23) vaccine. When PCV13 is also indicated, PCV13 should be obtained first. All adults aged 50 years and older should be immunized. An adult younger than age 76 years who has certain medical conditions should be immunized. Any person who resides in a nursing home or long-term care facility should be immunized. An adult smoker should be immunized. People with an immunocompromised condition and certain other conditions should receive both PCV13 and PPSV23 vaccines. People with human immunodeficiency virus (HIV) infection should be immunized as soon as possible after diagnosis. Immunization during chemotherapy or radiation therapy should be avoided. Routine use of PPSV23 vaccine is not recommended for American Indians, [place] Natives, or people younger than 65 years unless there are medical conditions that require PPSV23 vaccine. When indicated, people who have unknown immunization and have no record of immunization should receive PPSV23 vaccine. One-time revaccination 5 years after the first dose of PPSV23 is recommended for people aged 19-64 years who have chronic kidney failure, nephrotic syndrome, asplenia, or immunocompromised conditions. People who received 1-2 doses of PPSV23 before age 82 years should receive another dose of PPSV23 vaccine at age 2 years or later if at least 5 years have passed since the  previous dose. Doses of PPSV23 are not needed for people immunized with PPSV23 at or after age 52 years.  Meningococcal vaccine. Adults with asplenia or persistent complement component deficiencies should receive 2 doses  of quadrivalent meningococcal conjugate (MenACWY-D) vaccine. The doses should be obtained at least 2 months apart. Microbiologists working with certain meningococcal bacteria, [place] recruits, people at risk during an outbreak, and people who travel to or live in countries with a high rate of meningitis should be immunized. A first-year college student up through age 3 years who is living in a residence hall should receive a dose if she did not receive a dose on or after her 16th birthday. Adults who have certain high-risk conditions should receive one or more doses of vaccine.  Hepatitis A vaccine. Adults who wish to be protected from this disease, have certain high-risk conditions, work with hepatitis A-infected animals, work in hepatitis A research labs, or travel to or work in countries with a high rate of hepatitis A should be immunized. Adults who were previously unvaccinated and who anticipate close contact with an international adoptee during the first 60 days after arrival in the [place] from a country with a high rate of hepatitis A should be immunized.  Hepatitis B vaccine. Adults who wish to be protected from this disease, have certain high-risk conditions, may be exposed to blood or other infectious body fluids, are household contacts or sex partners of hepatitis B positive people, are clients or workers in certain care facilities, or travel to or work in countries with a high rate of hepatitis B should be immunized.  Haemophilus influenzae type b (Hib) vaccine. A previously unvaccinated person with asplenia or sickle cell disease or having a scheduled splenectomy should receive 1 dose of Hib vaccine. Regardless of previous immunization, a recipient of a hematopoietic  stem cell transplant should receive a 3-dose series 6-12 months after her successful transplant. Hib vaccine is not recommended for adults with HIV infection. Preventive Services / Frequency Ages 46 to 24 years  Blood pressure check.** / Every 1 to 2 years.  Lipid and cholesterol check.** / Every 5 years beginning at age 73.  Clinical breast exam.** / Every 3 years for women in their 29s and 70s.  BRCA-related cancer risk assessment.** / For women who have family members with a BRCA-related cancer (breast, ovarian, tubal, or peritoneal cancers).  Pap test.** / Every 2 years from ages 13 through 81. Every 3 years starting at age 27 through age 62 or 88 with a history of 3 consecutive normal Pap tests.  HPV screening.** / Every 3 years from ages 13 through ages 15 to 69 with a history of 3 consecutive normal Pap tests.  Hepatitis C blood test.** / For any individual with known risks for hepatitis C.  Skin self-exam. / Monthly.  Influenza vaccine. / Every year.  Tetanus, diphtheria, and acellular pertussis (Tdap, Td) vaccine.** / Consult your health care provider. Pregnant women should receive 1 dose of Tdap vaccine during each pregnancy. 1 dose of Td every 10 years.  Varicella vaccine.** / Consult your health care provider. Pregnant females who do not have evidence of immunity should receive the first dose after pregnancy.  HPV vaccine. / 3 doses over 6 months, if 5 and younger. The vaccine is not recommended for use in pregnant females. However, pregnancy testing is not needed before receiving a dose.  Measles, mumps, rubella (MMR) vaccine.** / You need at least 1 dose of MMR if you were born in 1957 or later. You may also need a 2nd dose. For females of childbearing age, rubella immunity should be determined. If there is no evidence of immunity, females who are not pregnant  should be vaccinated. If there is no evidence of immunity, females who are pregnant should delay immunization until  after pregnancy.  Pneumococcal 13-valent conjugate (PCV13) vaccine.** / Consult your health care provider.  Pneumococcal polysaccharide (PPSV23) vaccine.** / 1 to 2 doses if you smoke cigarettes or if you have certain conditions.  Meningococcal vaccine.** / 1 dose if you are age 66 to 67 years and a Market researcher living in a residence hall, or have one of several medical conditions, you need to get vaccinated against meningococcal disease. You may also need additional booster doses.  Hepatitis A vaccine.** / Consult your health care provider.  Hepatitis B vaccine.** / Consult your health care provider.  Haemophilus influenzae type b (Hib) vaccine.** / Consult your health care provider. Ages 65 to 52 years  Blood pressure check.** / Every 1 to 2 years.  Lipid and cholesterol check.** / Every 5 years beginning at age 30 years.  Lung cancer screening. / Every year if you are aged 40-80 years and have a 30-pack-year history of smoking and currently smoke or have quit within the past 15 years. Yearly screening is stopped once you have quit smoking for at least 15 years or develop a health problem that would prevent you from having lung cancer treatment.  Clinical breast exam.** / Every year after age 27 years.  BRCA-related cancer risk assessment.** / For women who have family members with a BRCA-related cancer (breast, ovarian, tubal, or peritoneal cancers).  Mammogram.** / Every year beginning at age 38 years and continuing for as long as you are in good health. Consult with your health care provider.  Pap test.** / Every 3 years starting at age 64 years through age 70 or 71 years with a history of 3 consecutive normal Pap tests.  HPV screening.** / Every 3 years from ages 63 years through ages 63 to 68 years with a history of 3 consecutive normal Pap tests.  Fecal occult blood test (FOBT) of stool. / Every year beginning at age 65 years and continuing until age 52 years.  You may not need to do this test if you get a colonoscopy every 10 years.  Flexible sigmoidoscopy or colonoscopy.** / Every 5 years for a flexible sigmoidoscopy or every 10 years for a colonoscopy beginning at age 21 years and continuing until age 41 years.  Hepatitis C blood test.** / For all people born from [birth year] through 1963 and any individual with known risks for hepatitis C.  Skin self-exam. / Monthly.  Influenza vaccine. / Every year.  Tetanus, diphtheria, and acellular pertussis (Tdap/Td) vaccine.** / Consult your health care provider. Pregnant women should receive 1 dose of Tdap vaccine during each pregnancy. 1 dose of Td every 10 years.  Varicella vaccine.** / Consult your health care provider. Pregnant females who do not have evidence of immunity should receive the first dose after pregnancy.  Zoster vaccine.** / 1 dose for adults aged 62 years or older.  Measles, mumps, rubella (MMR) vaccine.** / You need at least 1 dose of MMR if you were born in 1957 or later. You may also need a 2nd dose. For females of childbearing age, rubella immunity should be determined. If there is no evidence of immunity, females who are not pregnant should be vaccinated. If there is no evidence of immunity, females who are pregnant should delay immunization until after pregnancy.  Pneumococcal 13-valent conjugate (PCV13) vaccine.** / Consult your health care provider.  Pneumococcal polysaccharide (PPSV23) vaccine.** / 1 to  2 doses if you smoke cigarettes or if you have certain conditions.  Meningococcal vaccine.** / Consult your health care provider.  Hepatitis A vaccine.** / Consult your health care provider.  Hepatitis B vaccine.** / Consult your health care provider.  Haemophilus influenzae type b (Hib) vaccine.** / Consult your health care provider. Ages 7 years and over  Blood pressure check.** / Every 1 to 2 years.  Lipid and cholesterol check.** / Every 5 years beginning at age 47  years.  Lung cancer screening. / Every year if you are aged 24-80 years and have a 30-pack-year history of smoking and currently smoke or have quit within the past 15 years. Yearly screening is stopped once you have quit smoking for at least 15 years or develop a health problem that would prevent you from having lung cancer treatment.  Clinical breast exam.** / Every year after age 16 years.  BRCA-related cancer risk assessment.** / For women who have family members with a BRCA-related cancer (breast, ovarian, tubal, or peritoneal cancers).  Mammogram.** / Every year beginning at age 96 years and continuing for as long as you are in good health. Consult with your health care provider.  Pap test.** / Every 3 years starting at age 36 years through age 50 or 78 years with 3 consecutive normal Pap tests. Testing can be stopped between 65 and 70 years with 3 consecutive normal Pap tests and no abnormal Pap or HPV tests in the past 10 years.  HPV screening.** / Every 3 years from ages 70 years through ages 84 or 46 years with a history of 3 consecutive normal Pap tests. Testing can be stopped between 65 and 70 years with 3 consecutive normal Pap tests and no abnormal Pap or HPV tests in the past 10 years.  Fecal occult blood test (FOBT) of stool. / Every year beginning at age 89 years and continuing until age 62 years. You may not need to do this test if you get a colonoscopy every 10 years.  Flexible sigmoidoscopy or colonoscopy.** / Every 5 years for a flexible sigmoidoscopy or every 10 years for a colonoscopy beginning at age 44 years and continuing until age 76 years.  Hepatitis C blood test.** / For all people born from [birth year] through 1963 and any individual with known risks for hepatitis C.  Osteoporosis screening.** / A one-time screening for women ages 59 years and over and women at risk for fractures or osteoporosis.  Skin self-exam. / Monthly.  Influenza vaccine. / Every year.  Tetanus,  diphtheria, and acellular pertussis (Tdap/Td) vaccine.** / 1 dose of Td every 10 years.  Varicella vaccine.** / Consult your health care provider.  Zoster vaccine.** / 1 dose for adults aged 42 years or older.  Pneumococcal 13-valent conjugate (PCV13) vaccine.** / Consult your health care provider.  Pneumococcal polysaccharide (PPSV23) vaccine.** / 1 dose for all adults aged 71 years and older.  Meningococcal vaccine.** / Consult your health care provider.  Hepatitis A vaccine.** / Consult your health care provider.  Hepatitis B vaccine.** / Consult your health care provider.  Haemophilus influenzae type b (Hib) vaccine.** / Consult your health care provider. ** Family history and personal history of risk and conditions may change your health care provider's recommendations. Document Released: 04/17/2001 Document Revised: 07/06/2013 Document Reviewed: 07/17/2010 [facility] Patient Information 2015 [place], [place]. This information is not intended to replace advice given to you by your health care provider. Make sure you discuss any questions you have with your health  care provider.

## 2013-11-30 NOTE — Telephone Encounter (Signed)
Urine Culture final results are in.  Infection is sensitive to Levaquin.  Will Rx Levaquin 250 mg once daily for 5 days. Sent to her [employer].  Would like for patient to return in 1.5-2 weeks for repeat UA and Urine culture.

## 2013-12-17 MED ORDER — LEVOFLOXACIN 250 MG PO TABS: 250.0000 mg | ORAL_TABLET | Freq: Every day | ORAL | Status: DC

## 2013-12-17 NOTE — Telephone Encounter (Signed)
Called and spoke with patient's husband concerning the start of Levaquin. He voiced understanding. He stated he would like the prescription sent to the pharmacy at [facility]. Medication was sent in and cancelled at [place]. I also informed him that patient would need to return in 1.5-2 weeks for repeat UA. He voiced understanding. JG//CMA

## 2013-12-17 NOTE — Telephone Encounter (Signed)
Patient returning your phone call. Best # [phone]

## 2013-12-17 NOTE — Telephone Encounter (Signed)
Returned call to [phone] and did not get an answer. JG//CMA

## 2013-12-18 ENCOUNTER — Other Ambulatory Visit (INDEPENDENT_AMBULATORY_CARE_PROVIDER_SITE_OTHER): Payer: Medicare Other

## 2013-12-18 DIAGNOSIS — N390 Urinary tract infection, site not specified: Secondary | ICD-10-CM | POA: Diagnosis not present

## 2013-12-18 DIAGNOSIS — R319 Hematuria, unspecified: Secondary | ICD-10-CM | POA: Diagnosis not present

## 2013-12-18 LAB — POCT URINALYSIS DIPSTICK
Bilirubin, UA: NEGATIVE
Blood, UA: NEGATIVE
Glucose, UA: NEGATIVE
Ketones, UA: NEGATIVE
NITRITE UA: NEGATIVE
PROTEIN UA: NEGATIVE
Spec Grav, UA: 1.015
UROBILINOGEN UA: 0.2
pH, UA: 6

## 2013-12-19 LAB — URINE CULTURE: Colony Count: 6000

## 2014-01-08 ENCOUNTER — Telehealth: Payer: Self-pay | Admitting: Physician Assistant

## 2014-01-08 MED ORDER — FLUTICASONE-SALMETEROL 250-50 MCG/DOSE IN AEPB: 1.0000 | INHALATION_SPRAY | Freq: Two times a day (BID) | RESPIRATORY_TRACT | Status: DC

## 2014-01-08 NOTE — Telephone Encounter (Signed)
Rx request to pharmacy/SLS  

## 2014-01-08 NOTE — Telephone Encounter (Signed)
Caller name: Sarina, Robleto Relation to pt: self  Call back number: [phone] Pharmacy: [facility] [phone] ([place])     Reason for call: fluticasone (FLONASE) 50 MCG/ACT nasal spray & Fluticasone-Salmeterol (ADVAIR) 250-50 MCG/DOSE AEPB

## 2014-01-11 ENCOUNTER — Ambulatory Visit: Payer: Medicare Other | Attending: Physician Assistant

## 2014-01-11 DIAGNOSIS — J449 Chronic obstructive pulmonary disease, unspecified: Secondary | ICD-10-CM | POA: Diagnosis not present

## 2014-01-11 DIAGNOSIS — M419 Scoliosis, unspecified: Secondary | ICD-10-CM | POA: Insufficient documentation

## 2014-01-11 DIAGNOSIS — Z96652 Presence of left artificial knee joint: Secondary | ICD-10-CM | POA: Diagnosis not present

## 2014-01-11 DIAGNOSIS — M4800 Spinal stenosis, site unspecified: Secondary | ICD-10-CM | POA: Insufficient documentation

## 2014-01-11 DIAGNOSIS — R2689 Other abnormalities of gait and mobility: Secondary | ICD-10-CM | POA: Diagnosis not present

## 2014-01-11 DIAGNOSIS — M8580 Other specified disorders of bone density and structure, unspecified site: Secondary | ICD-10-CM | POA: Diagnosis not present

## 2014-01-11 DIAGNOSIS — Z5189 Encounter for other specified aftercare: Secondary | ICD-10-CM | POA: Diagnosis not present

## 2014-01-11 DIAGNOSIS — M6281 Muscle weakness (generalized): Secondary | ICD-10-CM | POA: Diagnosis not present

## 2014-01-11 DIAGNOSIS — R293 Abnormal posture: Secondary | ICD-10-CM | POA: Insufficient documentation

## 2014-01-11 DIAGNOSIS — M810 Age-related osteoporosis without current pathological fracture: Secondary | ICD-10-CM | POA: Diagnosis not present

## 2014-01-12 ENCOUNTER — Other Ambulatory Visit: Payer: Self-pay | Admitting: *Deleted

## 2014-01-12 ENCOUNTER — Ambulatory Visit
Admission: RE | Admit: 2014-01-12 | Discharge: 2014-01-12 | Disposition: A | Discharge status: Home / Self Care | Payer: Medicare Other | Source: Ambulatory Visit | Attending: Physician Assistant | Admitting: Physician Assistant

## 2014-01-12 DIAGNOSIS — M8580 Other specified disorders of bone density and structure, unspecified site: Secondary | ICD-10-CM

## 2014-01-12 DIAGNOSIS — Z1239 Encounter for other screening for malignant neoplasm of breast: Secondary | ICD-10-CM

## 2014-01-12 DIAGNOSIS — Z1231 Encounter for screening mammogram for malignant neoplasm of breast: Secondary | ICD-10-CM | POA: Diagnosis not present

## 2014-01-12 DIAGNOSIS — M85832 Other specified disorders of bone density and structure, left forearm: Secondary | ICD-10-CM | POA: Diagnosis not present

## 2014-01-12 DIAGNOSIS — Z780 Asymptomatic menopausal state: Secondary | ICD-10-CM | POA: Diagnosis not present

## 2014-01-12 MED ORDER — FLUTICASONE PROPIONATE 50 MCG/ACT NA SUSP: 2.0000 | Freq: Every day | NASAL | Status: DC

## 2014-01-12 NOTE — Telephone Encounter (Signed)
Rx request to pharmacy/SLS  

## 2014-01-13 ENCOUNTER — Telehealth: Payer: Self-pay | Admitting: Physician Assistant

## 2014-01-13 NOTE — Telephone Encounter (Signed)
Mammogram reveals no concerning findings of malignancy.  Imaging Center is mailing patient a copy of results.  Repeat Mammogram usually recommended in 2 years but giving patient's advanced age, may defer next mammogram.   Bone density scan reveals osteoporosis.  Will continue Citracal two tablets daily.  Add some resistance exercises with a small 2-5 lb weight to help prevent weakening of bones.  Will repeat DEXA scan in 1-2 years.

## 2014-01-13 NOTE — Telephone Encounter (Signed)
Patient informed, understood & agreed/SLS  

## 2014-01-19 ENCOUNTER — Ambulatory Visit: Payer: Medicare Other | Admitting: Rehabilitation

## 2014-01-19 DIAGNOSIS — M419 Scoliosis, unspecified: Secondary | ICD-10-CM | POA: Diagnosis not present

## 2014-01-19 DIAGNOSIS — R293 Abnormal posture: Secondary | ICD-10-CM | POA: Diagnosis not present

## 2014-01-19 DIAGNOSIS — Z5189 Encounter for other specified aftercare: Secondary | ICD-10-CM | POA: Diagnosis not present

## 2014-01-19 DIAGNOSIS — M6281 Muscle weakness (generalized): Secondary | ICD-10-CM | POA: Diagnosis not present

## 2014-01-19 DIAGNOSIS — J449 Chronic obstructive pulmonary disease, unspecified: Secondary | ICD-10-CM | POA: Diagnosis not present

## 2014-01-19 DIAGNOSIS — R2689 Other abnormalities of gait and mobility: Secondary | ICD-10-CM | POA: Diagnosis not present

## 2014-01-21 ENCOUNTER — Ambulatory Visit: Payer: Medicare Other | Admitting: Rehabilitation

## 2014-01-21 DIAGNOSIS — J449 Chronic obstructive pulmonary disease, unspecified: Secondary | ICD-10-CM | POA: Diagnosis not present

## 2014-01-21 DIAGNOSIS — R2689 Other abnormalities of gait and mobility: Secondary | ICD-10-CM | POA: Diagnosis not present

## 2014-01-21 DIAGNOSIS — Z5189 Encounter for other specified aftercare: Secondary | ICD-10-CM | POA: Diagnosis not present

## 2014-01-21 DIAGNOSIS — M6281 Muscle weakness (generalized): Secondary | ICD-10-CM | POA: Diagnosis not present

## 2014-01-21 DIAGNOSIS — M419 Scoliosis, unspecified: Secondary | ICD-10-CM | POA: Diagnosis not present

## 2014-01-21 DIAGNOSIS — R293 Abnormal posture: Secondary | ICD-10-CM | POA: Diagnosis not present

## 2014-01-26 ENCOUNTER — Ambulatory Visit: Payer: Medicare Other | Admitting: Rehabilitation

## 2014-01-26 DIAGNOSIS — M419 Scoliosis, unspecified: Secondary | ICD-10-CM | POA: Diagnosis not present

## 2014-01-26 DIAGNOSIS — M6281 Muscle weakness (generalized): Secondary | ICD-10-CM | POA: Diagnosis not present

## 2014-01-26 DIAGNOSIS — R293 Abnormal posture: Secondary | ICD-10-CM | POA: Diagnosis not present

## 2014-01-26 DIAGNOSIS — R2689 Other abnormalities of gait and mobility: Secondary | ICD-10-CM | POA: Diagnosis not present

## 2014-01-26 DIAGNOSIS — J449 Chronic obstructive pulmonary disease, unspecified: Secondary | ICD-10-CM | POA: Diagnosis not present

## 2014-01-26 DIAGNOSIS — Z5189 Encounter for other specified aftercare: Secondary | ICD-10-CM | POA: Diagnosis not present

## 2014-02-02 ENCOUNTER — Ambulatory Visit: Payer: Medicare Other | Attending: Physician Assistant | Admitting: Rehabilitation

## 2014-02-02 DIAGNOSIS — Z5189 Encounter for other specified aftercare: Secondary | ICD-10-CM | POA: Diagnosis not present

## 2014-02-02 DIAGNOSIS — M8580 Other specified disorders of bone density and structure, unspecified site: Secondary | ICD-10-CM | POA: Diagnosis not present

## 2014-02-02 DIAGNOSIS — M6281 Muscle weakness (generalized): Secondary | ICD-10-CM | POA: Insufficient documentation

## 2014-02-02 DIAGNOSIS — M810 Age-related osteoporosis without current pathological fracture: Secondary | ICD-10-CM | POA: Diagnosis not present

## 2014-02-02 DIAGNOSIS — R2689 Other abnormalities of gait and mobility: Secondary | ICD-10-CM | POA: Diagnosis not present

## 2014-02-02 DIAGNOSIS — M419 Scoliosis, unspecified: Secondary | ICD-10-CM | POA: Insufficient documentation

## 2014-02-02 DIAGNOSIS — Z96652 Presence of left artificial knee joint: Secondary | ICD-10-CM | POA: Diagnosis not present

## 2014-02-02 DIAGNOSIS — J449 Chronic obstructive pulmonary disease, unspecified: Secondary | ICD-10-CM | POA: Diagnosis not present

## 2014-02-02 DIAGNOSIS — R293 Abnormal posture: Secondary | ICD-10-CM | POA: Insufficient documentation

## 2014-02-02 DIAGNOSIS — M4800 Spinal stenosis, site unspecified: Secondary | ICD-10-CM | POA: Diagnosis not present

## 2014-02-04 ENCOUNTER — Ambulatory Visit: Payer: Medicare Other | Admitting: Rehabilitation

## 2014-02-04 DIAGNOSIS — Z5189 Encounter for other specified aftercare: Secondary | ICD-10-CM | POA: Diagnosis not present

## 2014-02-09 ENCOUNTER — Ambulatory Visit: Payer: Medicare Other | Admitting: Rehabilitation

## 2014-02-11 ENCOUNTER — Ambulatory Visit: Payer: Medicare Other | Admitting: Rehabilitation

## 2014-02-17 ENCOUNTER — Ambulatory Visit: Payer: Medicare Other | Admitting: Rehabilitation

## 2014-02-17 DIAGNOSIS — Z5189 Encounter for other specified aftercare: Secondary | ICD-10-CM | POA: Diagnosis not present

## 2014-02-23 ENCOUNTER — Ambulatory Visit: Payer: Medicare Other | Admitting: Rehabilitation

## 2014-02-23 DIAGNOSIS — Z5189 Encounter for other specified aftercare: Secondary | ICD-10-CM | POA: Diagnosis not present

## 2014-03-02 ENCOUNTER — Ambulatory Visit: Payer: Medicare Other | Admitting: Rehabilitation

## 2014-03-02 DIAGNOSIS — Z5189 Encounter for other specified aftercare: Secondary | ICD-10-CM | POA: Diagnosis not present

## 2014-03-03 ENCOUNTER — Ambulatory Visit: Payer: Medicare Other | Admitting: Rehabilitation

## 2014-03-03 DIAGNOSIS — Z5189 Encounter for other specified aftercare: Secondary | ICD-10-CM | POA: Diagnosis not present

## 2014-03-04 ENCOUNTER — Encounter: Payer: Medicare Other | Admitting: Rehabilitation

## 2014-04-05 DIAGNOSIS — Z961 Presence of intraocular lens: Secondary | ICD-10-CM | POA: Diagnosis not present

## 2014-04-05 DIAGNOSIS — H524 Presbyopia: Secondary | ICD-10-CM | POA: Diagnosis not present

## 2014-04-15 ENCOUNTER — Telehealth: Payer: Self-pay | Admitting: Physician Assistant

## 2014-04-15 DIAGNOSIS — J309 Allergic rhinitis, unspecified: Secondary | ICD-10-CM | POA: Insufficient documentation

## 2014-04-15 DIAGNOSIS — J449 Chronic obstructive pulmonary disease, unspecified: Secondary | ICD-10-CM | POA: Insufficient documentation

## 2014-04-15 DIAGNOSIS — S52615A Nondisplaced fracture of left ulna styloid process, initial encounter for closed fracture: Secondary | ICD-10-CM | POA: Diagnosis not present

## 2014-04-15 DIAGNOSIS — S52572A Other intraarticular fracture of lower end of left radius, initial encounter for closed fracture: Secondary | ICD-10-CM | POA: Diagnosis not present

## 2014-04-15 DIAGNOSIS — S6392XA Sprain of unspecified part of left wrist and hand, initial encounter: Secondary | ICD-10-CM | POA: Diagnosis not present

## 2014-04-15 DIAGNOSIS — M1990 Unspecified osteoarthritis, unspecified site: Secondary | ICD-10-CM | POA: Insufficient documentation

## 2014-04-15 DIAGNOSIS — S63502A Unspecified sprain of left wrist, initial encounter: Secondary | ICD-10-CM | POA: Diagnosis not present

## 2014-04-15 NOTE — Telephone Encounter (Signed)
[facility] Day - Client [facility] Patient Name: Tanya Harris DOB: 1932-11-07 Initial Comment Caller states she was seen at the dentist. She fell out of and thinks she might have broken her wrist. Nurse Assessment Nurse: Martyn Ehrich, RN, Felicia Date/Time ([place] Time): 04/15/2014 4:09:36 PM Confirm and document reason for call. If symptomatic, describe symptoms. ---Pt was at the dentist today and just came home. When she was in dental chair she reached over to get something and fell out of chair - only injury is her L wrist and above wrist (swelling). It is swollen and she uses L hand for cane and walker to walk .Has the patient traveled out of the country within the last 30 days? ---No Does the patient require triage? ---Yes Related visit to physician within the last 2 weeks? ---No Does the PT have any chronic conditions? (i.e. diabetes, asthma, etc.) ---Yes List chronic conditions. ---very mild COPD Guidelines Guideline Title Affirmed Question Affirmed Notes Hand and Wrist Injury [1] Large swelling or bruise (> 2 inches or 5 cm) AND [2] can't use injured hand normally (e.g., make a fist, open fully, hold a glass of water) Final Disposition User See Physician within 4 Hours (or PCP triage) Martyn Ehrich, RN, Felicia Comments told them they need to go somewhere with Xray ability, they are deciding and Took UC info from [place] Dr. and will check there and another UC and if they need to will go to [place], [place] is her provider  PLEASE NOTE: All timestamps contained within this report are represented as [place] Standard Time. CONFIDENTIALTY NOTICE: This fax transmission is intended only for the addressee. It contains information that is legally privileged, confidential or otherwise protected from use or disclosure. If you are not the intended recipient, you are strictly prohibited from reviewing, disclosing, copying  using or disseminating any of this information or taking any action in reliance on or regarding this information. If you have received this fax in error, please notify us immediately by telephone so that we can arrange for its return to [place]. Phone: [phone], Toll-Free: [phone], Fax: [phone] Page: 1 of 1 Call Id: [id number]

## 2014-04-16 ENCOUNTER — Other Ambulatory Visit: Payer: Self-pay | Admitting: Physician Assistant

## 2014-04-16 DIAGNOSIS — S60212A Contusion of left wrist, initial encounter: Secondary | ICD-10-CM | POA: Diagnosis not present

## 2014-04-16 DIAGNOSIS — S52592A Other fractures of lower end of left radius, initial encounter for closed fracture: Secondary | ICD-10-CM | POA: Diagnosis not present

## 2014-04-16 DIAGNOSIS — S6392XA Sprain of unspecified part of left wrist and hand, initial encounter: Secondary | ICD-10-CM | POA: Diagnosis not present

## 2014-04-16 DIAGNOSIS — S63502A Unspecified sprain of left wrist, initial encounter: Secondary | ICD-10-CM | POA: Diagnosis not present

## 2014-04-16 DIAGNOSIS — M25632 Stiffness of left wrist, not elsewhere classified: Secondary | ICD-10-CM | POA: Diagnosis not present

## 2014-04-16 DIAGNOSIS — M25532 Pain in left wrist: Secondary | ICD-10-CM | POA: Diagnosis not present

## 2014-04-16 DIAGNOSIS — S5292XA Unspecified fracture of left forearm, initial encounter for closed fracture: Secondary | ICD-10-CM

## 2014-04-16 NOTE — Telephone Encounter (Signed)
Called patient to follow up on wrist.  Patient states she went to urgent care to get x-ray and was told that she would need to see orthopedist to have her left wrist put in a cast.  Notified Elyn Aquas, who placed referral.    Patient and husband notified. Stated understanding.  eal

## 2014-04-20 DIAGNOSIS — S60212D Contusion of left wrist, subsequent encounter: Secondary | ICD-10-CM | POA: Diagnosis not present

## 2014-04-20 DIAGNOSIS — S52592D Other fractures of lower end of left radius, subsequent encounter for closed fracture with routine healing: Secondary | ICD-10-CM | POA: Diagnosis not present

## 2014-05-04 DIAGNOSIS — S52209A Unspecified fracture of shaft of unspecified ulna, initial encounter for closed fracture: Secondary | ICD-10-CM

## 2014-05-04 DIAGNOSIS — S62102A Fracture of unspecified carpal bone, left wrist, initial encounter for closed fracture: Secondary | ICD-10-CM

## 2014-05-04 HISTORY — DX: Fracture of unspecified carpal bone, left wrist, initial encounter for closed fracture: S62.102A

## 2014-05-04 HISTORY — DX: Unspecified fracture of shaft of unspecified ulna, initial encounter for closed fracture: S52.209A

## 2014-05-11 DIAGNOSIS — S52592D Other fractures of lower end of left radius, subsequent encounter for closed fracture with routine healing: Secondary | ICD-10-CM | POA: Diagnosis not present

## 2014-05-21 DIAGNOSIS — S52502A Unspecified fracture of the lower end of left radius, initial encounter for closed fracture: Secondary | ICD-10-CM | POA: Diagnosis not present

## 2014-06-01 ENCOUNTER — Encounter: Payer: Self-pay | Admitting: Physician Assistant

## 2014-06-01 ENCOUNTER — Ambulatory Visit (INDEPENDENT_AMBULATORY_CARE_PROVIDER_SITE_OTHER): Payer: Medicare Other | Admitting: Physician Assistant

## 2014-06-01 VITALS — BP 116/60 | HR 76 | Temp 97.9°F | Resp 16 | Ht 62.0 in | Wt 162.4 lb

## 2014-06-01 DIAGNOSIS — R7989 Other specified abnormal findings of blood chemistry: Secondary | ICD-10-CM | POA: Diagnosis not present

## 2014-06-01 DIAGNOSIS — J449 Chronic obstructive pulmonary disease, unspecified: Secondary | ICD-10-CM

## 2014-06-01 DIAGNOSIS — M8580 Other specified disorders of bone density and structure, unspecified site: Secondary | ICD-10-CM | POA: Diagnosis not present

## 2014-06-01 DIAGNOSIS — Z23 Encounter for immunization: Secondary | ICD-10-CM

## 2014-06-01 DIAGNOSIS — M859 Disorder of bone density and structure, unspecified: Secondary | ICD-10-CM | POA: Diagnosis not present

## 2014-06-01 DIAGNOSIS — S52502D Unspecified fracture of the lower end of left radius, subsequent encounter for closed fracture with routine healing: Secondary | ICD-10-CM | POA: Diagnosis not present

## 2014-06-01 DIAGNOSIS — E785 Hyperlipidemia, unspecified: Secondary | ICD-10-CM | POA: Diagnosis not present

## 2014-06-01 LAB — LIPID PANEL
Cholesterol: 205 mg/dL — ABNORMAL HIGH (ref 0–200)
HDL: 46.4 mg/dL (ref >39.00)
LDL Cholesterol: 128 mg/dL — ABNORMAL HIGH (ref 0–99)
NonHDL: 158.6
TRIGLYCERIDES: 153 mg/dL — AB (ref 0.0–149.0)
Total CHOL/HDL Ratio: 4
VLDL: 30.6 mg/dL (ref 0.0–40.0)

## 2014-06-01 LAB — VITAMIN D 25 HYDROXY (VIT D DEFICIENCY, FRACTURES): VITD: 43.97 ng/mL (ref 30.00–100.00)

## 2014-06-01 LAB — CALCIUM: CALCIUM: 9.9 mg/dL (ref 8.4–10.5)

## 2014-06-01 NOTE — Patient Instructions (Signed)
Please go to the lab for blood work. I will call you with your results.  Please take your Citracal D daily as directed. Stay active.  Follow-up with me in 6 months for your complete physical

## 2014-06-01 NOTE — Progress Notes (Signed)
Pre visit review using our clinic review tool, if applicable. No additional management support is needed unless otherwise documented below in the visit note/SLS  

## 2014-06-04 DIAGNOSIS — E785 Hyperlipidemia, unspecified: Secondary | ICD-10-CM | POA: Insufficient documentation

## 2014-06-04 NOTE — Assessment & Plan Note (Signed)
Stable  Continue current regimen  

## 2014-06-04 NOTE — Assessment & Plan Note (Signed)
Continue Citracal D supplementation. We'll check calcium and vitamin D level today. Will be due for repeat density scan next year.

## 2014-06-04 NOTE — Progress Notes (Signed)
Patient presents to clinic today c/o for 53-month follow-up of Osteopenia, COPD and hyperlipidemia. Patient endorses taking all medications as directed.  Is watching her diet and trying to exercise daily.  Denies acute concern at today's visit.  Is fasting for labs.  Past Medical History  Diagnosis Date  . COPD (chronic obstructive pulmonary disease)     Mild  . Arthritis, rheumatoid     Spine  . Spinal stenosis   . Scoliosis   . DJD (degenerative joint disease) of knee     Current Outpatient Prescriptions on File Prior to Visit  Medication Sig Dispense Refill  . aspirin 81 MG tablet Take 81 mg by mouth daily.    . Calcium Carbonate-Vitamin D (CALCIUM 600+D) 600-400 MG-UNIT per tablet Take 2 tablets by mouth daily.    . fluticasone (FLONASE) 50 MCG/ACT nasal spray Place 2 sprays into both nostrils daily. 16 g 1  . Fluticasone-Salmeterol (ADVAIR) 250-50 MCG/DOSE AEPB Inhale 1 puff into the lungs 2 (two) times daily. 60 each 2  . glucosamine-chondroitin 500-400 MG tablet Take 2 tablets by mouth daily.    Marland Kitchen guaiFENesin (MUCINEX) 600 MG 12 hr tablet Take 600 mg by mouth daily as needed.     . meloxicam (MOBIC) 15 MG tablet Take 15 mg by mouth daily as needed for pain.    . Multiple Vitamin (MULTIVITAMIN) tablet Take 1 tablet by mouth daily.     No current facility-administered medications on file prior to visit.    Allergies  Allergen Reactions  . Amoxicillin Nausea And Vomiting  . Ibuprofen Swelling    Mouth Swelling     Family History  Problem Relation Age of Onset  . Heart disease Mother 53    Deceased  . Diabetes Mother   . Heart disease Father 44    Deceased  . Asthma Father   . Heart disease Maternal Uncle   . Colon cancer Brother   . Diabetes Brother     #2  . Heart disease Brother     x2  . Healthy Sister   . Healthy Brother     x1  . Hyperlipidemia Son     x2    History   Social History  . Marital Status: Married    Spouse Name: N/A  . Number of  Children: N/A  . Years of Education: N/A   Social History Main Topics  . Smoking status: Never Smoker   . Smokeless tobacco: Not on file  . Alcohol Use: Not on file  . Drug Use: Not on file  . Sexual Activity: Not on file   Other Topics Concern  . None   Social History Narrative    Review of Systems - See HPI.  All other ROS are negative.  BP 116/60 mmHg  Pulse 76  Temp(Src) 97.9 F (36.6 C) (Oral)  Resp 16  Ht 5\' 2"  (1.575 m)  Wt 162 lb 6 oz (73.653 kg)  BMI 29.69 kg/m2  SpO2 99%  Physical Exam  Constitutional: She is oriented to person, place, and time and well-developed, well-nourished, and in no distress.  HENT:  Head: Normocephalic and atraumatic.  Eyes: Conjunctivae are normal.  Cardiovascular: Normal rate, regular rhythm, normal heart sounds and intact distal pulses.   Pulmonary/Chest: Effort normal and breath sounds normal. No respiratory distress. She has no wheezes. She has no rales. She exhibits no tenderness.  Neurological: She is alert and oriented to person, place, and time.  Skin: Skin is warm  and dry. No rash noted.  Psychiatric: Affect normal.  Vitals reviewed.   Recent Results (from the past 2160 hour(s))  Lipid Profile     Status: Abnormal   Collection Time: 06/01/14 10:12 AM  Result Value Ref Range   Cholesterol 205 (H) 0 - 200 mg/dL    Comment: ATP III Classification       Desirable:  < 200 mg/dL               Borderline High:  200 - 239 mg/dL          High:  > = 240 mg/dL   Triglycerides 153.0 (H) 0.0 - 149.0 mg/dL    Comment: Normal:  <150 mg/dLBorderline High:  150 - 199 mg/dL   HDL 46.40 >39.00 mg/dL   VLDL 30.6 0.0 - 40.0 mg/dL   LDL Cholesterol 128 (H) 0 - 99 mg/dL   Total CHOL/HDL Ratio 4     Comment:                Men          Women1/2 Average Risk     3.4          3.3Average Risk          5.0          4.42X Average Risk          9.6          7.13X Average Risk          15.0          11.0                       NonHDL 158.60      Comment: NOTE:  Non-HDL goal should be 30 mg/dL higher than patient's LDL goal (i.e. LDL goal of < 70 mg/dL, would have non-HDL goal of < 100 mg/dL)  Calcium     Status: None   Collection Time: 06/01/14 10:12 AM  Result Value Ref Range   Calcium 9.9 8.4 - 10.5 mg/dL  Vitamin D (25 hydroxy)     Status: None   Collection Time: 06/01/14 10:12 AM  Result Value Ref Range   VITD 43.97 30.00 - 100.00 ng/mL    Assessment/Plan: COPD with chronic bronchitis Stable. Continue current regimen.   Osteopenia Continue Citracal D supplementation. We'll check calcium and vitamin D level today. Will be due for repeat density scan next year.   Hyperlipidemia Previously controlled with diet and exercise. Will obtain lipid panel today.

## 2014-06-04 NOTE — Assessment & Plan Note (Signed)
Previously controlled with diet and exercise. Will obtain lipid panel today.

## 2014-06-10 DIAGNOSIS — M25632 Stiffness of left wrist, not elsewhere classified: Secondary | ICD-10-CM | POA: Diagnosis not present

## 2014-06-10 DIAGNOSIS — M25532 Pain in left wrist: Secondary | ICD-10-CM | POA: Diagnosis not present

## 2014-06-10 DIAGNOSIS — S52592D Other fractures of lower end of left radius, subsequent encounter for closed fracture with routine healing: Secondary | ICD-10-CM | POA: Diagnosis not present

## 2014-07-30 ENCOUNTER — Telehealth: Payer: Self-pay | Admitting: Physician Assistant

## 2014-07-30 MED ORDER — FLUTICASONE PROPIONATE 50 MCG/ACT NA SUSP: 2.0000 | Freq: Every day | NASAL | Status: DC

## 2014-07-30 MED ORDER — FLUTICASONE-SALMETEROL 250-50 MCG/DOSE IN AEPB: 1.0000 | INHALATION_SPRAY | Freq: Two times a day (BID) | RESPIRATORY_TRACT | Status: DC

## 2014-07-30 NOTE — Telephone Encounter (Signed)
Caller name: Kynnadi Relation to pt: self Call back number: [phone] Pharmacy: [facility] on [address]  Reason for call:   Patient states that she saw Einar Pheasant recently and thought that her medication would be sent in. She is requesting that advair and nasal spray are called in.

## 2014-07-30 NOTE — Telephone Encounter (Signed)
Rx's sent to the pharmacy by e-script.  Pt aware.//AB/CMA

## 2014-10-04 ENCOUNTER — Telehealth: Payer: Self-pay | Admitting: *Deleted

## 2014-10-04 NOTE — Telephone Encounter (Signed)
Letter faxed to below number at 4pm.

## 2014-10-04 NOTE — Telephone Encounter (Signed)
I do not think she needs abx prior to her cleaning.

## 2014-10-04 NOTE — Telephone Encounter (Signed)
Dr. Dannielle Burn, dental office calling to see if patient needs to be premedicated for routine cleaning (scheduled today at 3:30).  Patient is from [place] and was premedicated for last cleaning (order from MD in [place]).  She took Clindamycin 150mg  4 capsules 1 hour prior to appointment.  This Rx was given September 07, 2013 and patient has enough for today if needed. The office would like to know if it is OK for her to take this again and if a script could be faxed to their office (fax number given).    Please advise.

## 2014-10-04 NOTE — Telephone Encounter (Signed)
Notified Jarrett Soho at [phone] and she requests that we fax letter to [phone]. Letter printed and forwarded to PCP for signature

## 2014-12-02 DIAGNOSIS — L718 Other rosacea: Secondary | ICD-10-CM | POA: Diagnosis not present

## 2014-12-07 ENCOUNTER — Telehealth: Payer: Self-pay | Admitting: Behavioral Health

## 2014-12-07 ENCOUNTER — Encounter: Payer: Self-pay | Admitting: Behavioral Health

## 2014-12-07 NOTE — Telephone Encounter (Signed)
Pre-Visit Call completed with patient and chart updated.   Pre-Visit Info documented in Specialty Comments under [employer].    

## 2014-12-08 ENCOUNTER — Encounter: Payer: Self-pay | Admitting: Physician Assistant

## 2014-12-08 ENCOUNTER — Ambulatory Visit (INDEPENDENT_AMBULATORY_CARE_PROVIDER_SITE_OTHER): Payer: Medicare Other | Admitting: Physician Assistant

## 2014-12-08 VITALS — BP 130/77 | HR 59 | Temp 97.9°F | Resp 16 | Ht 62.0 in | Wt 163.5 lb

## 2014-12-08 DIAGNOSIS — H9190 Unspecified hearing loss, unspecified ear: Secondary | ICD-10-CM | POA: Diagnosis not present

## 2014-12-08 DIAGNOSIS — E785 Hyperlipidemia, unspecified: Secondary | ICD-10-CM

## 2014-12-08 DIAGNOSIS — Z136 Encounter for screening for cardiovascular disorders: Secondary | ICD-10-CM | POA: Diagnosis not present

## 2014-12-08 DIAGNOSIS — Z0000 Encounter for general adult medical examination without abnormal findings: Secondary | ICD-10-CM | POA: Diagnosis not present

## 2014-12-08 LAB — LIPID PANEL
Cholesterol: 235 mg/dL — ABNORMAL HIGH (ref 0–200)
HDL: 57.7 mg/dL (ref >39.00)
LDL CALC: 158 mg/dL — AB (ref 0–99)
NonHDL: 177.24
Total CHOL/HDL Ratio: 4
Triglycerides: 98 mg/dL (ref 0.0–149.0)
VLDL: 19.6 mg/dL (ref 0.0–40.0)

## 2014-12-08 LAB — CBC
HCT: 43.8 % (ref 36.0–46.0)
Hemoglobin: 14.9 g/dL (ref 12.0–15.0)
MCHC: 34.1 g/dL (ref 30.0–36.0)
MCV: 89.5 fl (ref 78.0–100.0)
Platelets: 262 10*3/uL (ref 150.0–400.0)
RBC: 4.9 Mil/uL (ref 3.87–5.11)
RDW: 13.9 % (ref 11.5–15.5)
WBC: 6.2 10*3/uL (ref 4.0–10.5)

## 2014-12-08 LAB — COMPREHENSIVE METABOLIC PANEL
ALBUMIN: 3.9 g/dL (ref 3.5–5.2)
ALT: 15 U/L (ref 0–35)
AST: 23 U/L (ref 0–37)
Alkaline Phosphatase: 76 U/L (ref 39–117)
BUN: 17 mg/dL (ref 6–23)
CO2: 30 mEq/L (ref 19–32)
CREATININE: 0.61 mg/dL (ref 0.40–1.20)
Calcium: 9.9 mg/dL (ref 8.4–10.5)
Chloride: 99 mEq/L (ref 96–112)
GFR: 100.25 mL/min (ref >60.00)
Glucose, Bld: 93 mg/dL (ref 70–99)
Potassium: 4.3 mEq/L (ref 3.5–5.1)
SODIUM: 136 meq/L (ref 135–145)
Total Bilirubin: 0.5 mg/dL (ref 0.2–1.2)
Total Protein: 7.6 g/dL (ref 6.0–8.3)

## 2014-12-08 NOTE — Assessment & Plan Note (Signed)
Referral to [facility] placed.

## 2014-12-08 NOTE — Patient Instructions (Signed)
Please go to the lab for blood work. I will call you with your results. Keep your phone on as you will be contacted by ENT and Audiology for assessment.  Please continue medications as directed. I will call you once I have received and reviewed your records.  Preventive Care for Adults, Female A healthy lifestyle and preventive care can promote health and wellness. Preventive health guidelines for women include the following key practices.  A routine yearly physical is a good way to check with your health care provider about your health and preventive screening. It is a chance to share any concerns and updates on your health and to receive a thorough exam.  Visit your dentist for a routine exam and preventive care every 6 months. Brush your teeth twice a day and floss once a day. Good oral hygiene prevents tooth decay and gum disease.  The frequency of eye exams is based on your age, health, family medical history, use of contact lenses, and other factors. Follow your health care provider's recommendations for frequency of eye exams.  Eat a healthy diet. Foods like vegetables, fruits, whole grains, low-fat dairy products, and lean protein foods contain the nutrients you need without too many calories. Decrease your intake of foods high in solid fats, added sugars, and salt. Eat the right amount of calories for you.Get information about a proper diet from your health care provider, if necessary.  Regular physical exercise is one of the most important things you can do for your health. Most adults should get at least 150 minutes of moderate-intensity exercise (any activity that increases your heart rate and causes you to sweat) each week. In addition, most adults need muscle-strengthening exercises on 2 or more days a week.  Maintain a healthy weight. The body mass index (BMI) is a screening tool to identify possible weight problems. It provides an estimate of body fat based on height and weight.  Your health care provider can find your BMI and can help you achieve or maintain a healthy weight.For adults 20 years and older:  A BMI below 18.5 is considered underweight.  A BMI of 18.5 to 24.9 is normal.  A BMI of 25 to 29.9 is considered overweight.  A BMI of 30 and above is considered obese.  Maintain normal blood lipids and cholesterol levels by exercising and minimizing your intake of saturated fat. Eat a balanced diet with plenty of fruit and vegetables. Blood tests for lipids and cholesterol should begin at age 81 and be repeated every 5 years. If your lipid or cholesterol levels are high, you are over 50, or you are at high risk for heart disease, you may need your cholesterol levels checked more frequently.Ongoing high lipid and cholesterol levels should be treated with medicines if diet and exercise are not working.  If you smoke, find out from your health care provider how to quit. If you do not use tobacco, do not start.  Lung cancer screening is recommended for adults aged 67-80 years who are at high risk for developing lung cancer because of a history of smoking. A yearly low-dose CT scan of the lungs is recommended for people who have at least a 30-pack-year history of smoking and are a current smoker or have quit within the past 15 years. A pack year of smoking is smoking an average of 1 pack of cigarettes a day for 1 year (for example: 1 pack a day for 30 years or 2 packs a day for 15  years). Yearly screening should continue until the smoker has stopped smoking for at least 15 years. Yearly screening should be stopped for people who develop a health problem that would prevent them from having lung cancer treatment.  If you are pregnant, do not drink alcohol. If you are breastfeeding, be very cautious about drinking alcohol. If you are not pregnant and choose to drink alcohol, do not have more than 1 drink per day. One drink is considered to be 12 ounces (355 mL) of beer, 5  ounces (148 mL) of wine, or 1.5 ounces (44 mL) of liquor.  Avoid use of street drugs. Do not share needles with anyone. Ask for help if you need support or instructions about stopping the use of drugs.  High blood pressure causes heart disease and increases the risk of stroke. Your blood pressure should be checked at least every 1 to 2 years. Ongoing high blood pressure should be treated with medicines if weight loss and exercise do not work.  If you are 46-85 years old, ask your health care provider if you should take aspirin to prevent strokes.  Diabetes screening is done by taking a blood sample to check your blood glucose level after you have not eaten for a certain period of time (fasting). If you are not overweight and you do not have risk factors for diabetes, you should be screened once every 3 years starting at age 45. If you are overweight or obese and you are 51-21 years of age, you should be screened for diabetes every year as part of your cardiovascular risk assessment.  Breast cancer screening is essential preventive care for women. You should practice "breast self-awareness." This means understanding the normal appearance and feel of your breasts and may include breast self-examination. Any changes detected, no matter how small, should be reported to a health care provider. Women in their 81s and 30s should have a clinical breast exam (CBE) by a health care provider as part of a regular health exam every 1 to 3 years. After age 54, women should have a CBE every year. Starting at age 57, women should consider having a mammogram (breast X-ray test) every year. Women who have a family history of breast cancer should talk to their health care provider about genetic screening. Women at a high risk of breast cancer should talk to their health care providers about having an MRI and a mammogram every year.  Breast cancer gene (BRCA)-related cancer risk assessment is recommended for women who have  family members with BRCA-related cancers. BRCA-related cancers include breast, ovarian, tubal, and peritoneal cancers. Having family members with these cancers may be associated with an increased risk for harmful changes (mutations) in the breast cancer genes BRCA1 and BRCA2. Results of the assessment will determine the need for genetic counseling and BRCA1 and BRCA2 testing.  Your health care provider may recommend that you be screened regularly for cancer of the pelvic organs (ovaries, uterus, and vagina). This screening involves a pelvic examination, including checking for microscopic changes to the surface of your cervix (Pap test). You may be encouraged to have this screening done every 3 years, beginning at age 30.  For women ages 67-65, health care providers may recommend pelvic exams and Pap testing every 3 years, or they may recommend the Pap and pelvic exam, combined with testing for human papilloma virus (HPV), every 5 years. Some types of HPV increase your risk of cervical cancer. Testing for HPV may also be done on  women of any age with unclear Pap test results.  Other health care providers may not recommend any screening for nonpregnant women who are considered low risk for pelvic cancer and who do not have symptoms. Ask your health care provider if a screening pelvic exam is right for you.  If you have had past treatment for cervical cancer or a condition that could lead to cancer, you need Pap tests and screening for cancer for at least 20 years after your treatment. If Pap tests have been discontinued, your risk factors (such as having a new sexual partner) need to be reassessed to determine if screening should resume. Some women have medical problems that increase the chance of getting cervical cancer. In these cases, your health care provider may recommend more frequent screening and Pap tests.  Colorectal cancer can be detected and often prevented. Most routine colorectal cancer  screening begins at the age of 78 years and continues through age 23 years. However, your health care provider may recommend screening at an earlier age if you have risk factors for colon cancer. On a yearly basis, your health care provider may provide home test kits to check for hidden blood in the stool. Use of a small camera at the end of a tube, to directly examine the colon (sigmoidoscopy or colonoscopy), can detect the earliest forms of colorectal cancer. Talk to your health care provider about this at age 48, when routine screening begins. Direct exam of the colon should be repeated every 5-10 years through age 37 years, unless early forms of precancerous polyps or small growths are found.  People who are at an increased risk for hepatitis B should be screened for this virus. You are considered at high risk for hepatitis B if:  You were born in a country where hepatitis B occurs often. Talk with your health care provider about which countries are considered high risk.  Your parents were born in a high-risk country and you have not received a shot to protect against hepatitis B (hepatitis B vaccine).  You have HIV or AIDS.  You use needles to inject street drugs.  You live with, or have sex with, someone who has hepatitis B.  You get hemodialysis treatment.  You take certain medicines for conditions like cancer, organ transplantation, and autoimmune conditions.  Hepatitis C blood testing is recommended for all people born from [birth year] through 1965 and any individual with known risks for hepatitis C.  Practice safe sex. Use condoms and avoid high-risk sexual practices to reduce the spread of sexually transmitted infections (STIs). STIs include gonorrhea, chlamydia, syphilis, trichomonas, herpes, HPV, and human immunodeficiency virus (HIV). Herpes, HIV, and HPV are viral illnesses that have no cure. They can result in disability, cancer, and death.  You should be screened for sexually  transmitted illnesses (STIs) including gonorrhea and chlamydia if:  You are sexually active and are younger than 24 years.  You are older than 24 years and your health care provider tells you that you are at risk for this type of infection.  Your sexual activity has changed since you were last screened and you are at an increased risk for chlamydia or gonorrhea. Ask your health care provider if you are at risk.  If you are at risk of being infected with HIV, it is recommended that you take a prescription medicine daily to prevent HIV infection. This is called preexposure prophylaxis (PrEP). You are considered at risk if:  You are sexually active and do  not regularly use condoms or know the HIV status of your partner(s).  You take drugs by injection.  You are sexually active with a partner who has HIV.  Talk with your health care provider about whether you are at high risk of being infected with HIV. If you choose to begin PrEP, you should first be tested for HIV. You should then be tested every 3 months for as long as you are taking PrEP.  Osteoporosis is a disease in which the bones lose minerals and strength with aging. This can result in serious bone fractures or breaks. The risk of osteoporosis can be identified using a bone density scan. Women ages 22 years and over and women at risk for fractures or osteoporosis should discuss screening with their health care providers. Ask your health care provider whether you should take a calcium supplement or vitamin D to reduce the rate of osteoporosis.  Menopause can be associated with physical symptoms and risks. Hormone replacement therapy is available to decrease symptoms and risks. You should talk to your health care provider about whether hormone replacement therapy is right for you.  Use sunscreen. Apply sunscreen liberally and repeatedly throughout the day. You should seek shade when your shadow is shorter than you. Protect yourself by  wearing long sleeves, pants, a wide-brimmed hat, and sunglasses year round, whenever you are outdoors.  Once a month, do a whole body skin exam, using a mirror to look at the skin on your back. Tell your health care provider of new moles, moles that have irregular borders, moles that are larger than a pencil eraser, or moles that have changed in shape or color.  Stay current with required vaccines (immunizations).  Influenza vaccine. All adults should be immunized every year.  Tetanus, diphtheria, and acellular pertussis (Td, Tdap) vaccine. Pregnant women should receive 1 dose of Tdap vaccine during each pregnancy. The dose should be obtained regardless of the length of time since the last dose. Immunization is preferred during the 27th-36th week of gestation. An adult who has not previously received Tdap or who does not know her vaccine status should receive 1 dose of Tdap. This initial dose should be followed by tetanus and diphtheria toxoids (Td) booster doses every 10 years. Adults with an unknown or incomplete history of completing a 3-dose immunization series with Td-containing vaccines should begin or complete a primary immunization series including a Tdap dose. Adults should receive a Td booster every 10 years.  Varicella vaccine. An adult without evidence of immunity to varicella should receive 2 doses or a second dose if she has previously received 1 dose. Pregnant females who do not have evidence of immunity should receive the first dose after pregnancy. This first dose should be obtained before leaving the health care facility. The second dose should be obtained 4-8 weeks after the first dose.  Human papillomavirus (HPV) vaccine. Females aged 13-26 years who have not received the vaccine previously should obtain the 3-dose series. The vaccine is not recommended for use in pregnant females. However, pregnancy testing is not needed before receiving a dose. If a female is found to be pregnant  after receiving a dose, no treatment is needed. In that case, the remaining doses should be delayed until after the pregnancy. Immunization is recommended for any person with an immunocompromised condition through the age of 60 years if she did not get any or all doses earlier. During the 3-dose series, the second dose should be obtained 4-8 weeks after the  first dose. The third dose should be obtained 24 weeks after the first dose and 16 weeks after the second dose.  Zoster vaccine. One dose is recommended for adults aged 39 years or older unless certain conditions are present.  Measles, mumps, and rubella (MMR) vaccine. Adults born before [birth year] generally are considered immune to measles and mumps. Adults born in [birth year] or later should have 1 or more doses of MMR vaccine unless there is a contraindication to the vaccine or there is laboratory evidence of immunity to each of the three diseases. A routine second dose of MMR vaccine should be obtained at least 28 days after the first dose for students attending postsecondary schools, health care workers, or international travelers. People who received inactivated measles vaccine or an unknown type of measles vaccine during 1963-1967 should receive 2 doses of MMR vaccine. People who received inactivated mumps vaccine or an unknown type of mumps vaccine before 1979 and are at high risk for mumps infection should consider immunization with 2 doses of MMR vaccine. For females of childbearing age, rubella immunity should be determined. If there is no evidence of immunity, females who are not pregnant should be vaccinated. If there is no evidence of immunity, females who are pregnant should delay immunization until after pregnancy. Unvaccinated health care workers born before [birth year] who lack laboratory evidence of measles, mumps, or rubella immunity or laboratory confirmation of disease should consider measles and mumps immunization with 2 doses of MMR vaccine or rubella  immunization with 1 dose of MMR vaccine.  Pneumococcal 13-valent conjugate (PCV13) vaccine. When indicated, a person who is uncertain of his immunization history and has no record of immunization should receive the PCV13 vaccine. All adults 59 years of age and older should receive this vaccine. An adult aged 10 years or older who has certain medical conditions and has not been previously immunized should receive 1 dose of PCV13 vaccine. This PCV13 should be followed with a dose of pneumococcal polysaccharide (PPSV23) vaccine. Adults who are at high risk for pneumococcal disease should obtain the PPSV23 vaccine at least 8 weeks after the dose of PCV13 vaccine. Adults older than 79 years of age who have normal immune system function should obtain the PPSV23 vaccine dose at least 1 year after the dose of PCV13 vaccine.  Pneumococcal polysaccharide (PPSV23) vaccine. When PCV13 is also indicated, PCV13 should be obtained first. All adults aged 38 years and older should be immunized. An adult younger than age 36 years who has certain medical conditions should be immunized. Any person who resides in a nursing home or long-term care facility should be immunized. An adult smoker should be immunized. People with an immunocompromised condition and certain other conditions should receive both PCV13 and PPSV23 vaccines. People with human immunodeficiency virus (HIV) infection should be immunized as soon as possible after diagnosis. Immunization during chemotherapy or radiation therapy should be avoided. Routine use of PPSV23 vaccine is not recommended for American Indians, [place] Natives, or people younger than 65 years unless there are medical conditions that require PPSV23 vaccine. When indicated, people who have unknown immunization and have no record of immunization should receive PPSV23 vaccine. One-time revaccination 5 years after the first dose of PPSV23 is recommended for people aged 19-64 years who have chronic  kidney failure, nephrotic syndrome, asplenia, or immunocompromised conditions. People who received 1-2 doses of PPSV23 before age 30 years should receive another dose of PPSV23 vaccine at age 36 years or later if at least 5 years  have passed since the previous dose. Doses of PPSV23 are not needed for people immunized with PPSV23 at or after age 47 years.  Meningococcal vaccine. Adults with asplenia or persistent complement component deficiencies should receive 2 doses of quadrivalent meningococcal conjugate (MenACWY-D) vaccine. The doses should be obtained at least 2 months apart. Microbiologists working with certain meningococcal bacteria, [employer] recruits, people at risk during an outbreak, and people who travel to or live in countries with a high rate of meningitis should be immunized. A first-year college student up through age 57 years who is living in a residence hall should receive a dose if she did not receive a dose on or after her 16th birthday. Adults who have certain high-risk conditions should receive one or more doses of vaccine.  Hepatitis A vaccine. Adults who wish to be protected from this disease, have certain high-risk conditions, work with hepatitis A-infected animals, work in hepatitis A research labs, or travel to or work in countries with a high rate of hepatitis A should be immunized. Adults who were previously unvaccinated and who anticipate close contact with an international adoptee during the first 60 days after arrival in the [place] from a country with a high rate of hepatitis A should be immunized.  Hepatitis B vaccine. Adults who wish to be protected from this disease, have certain high-risk conditions, may be exposed to blood or other infectious body fluids, are household contacts or sex partners of hepatitis B positive people, are clients or workers in certain care facilities, or travel to or work in countries with a high rate of hepatitis B should be  immunized.  Haemophilus influenzae type b (Hib) vaccine. A previously unvaccinated person with asplenia or sickle cell disease or having a scheduled splenectomy should receive 1 dose of Hib vaccine. Regardless of previous immunization, a recipient of a hematopoietic stem cell transplant should receive a 3-dose series 6-12 months after her successful transplant. Hib vaccine is not recommended for adults with HIV infection. Preventive Services / Frequency Ages 74 to 52 years  Blood pressure check.** / Every 3-5 years.  Lipid and cholesterol check.** / Every 5 years beginning at age 67.  Clinical breast exam.** / Every 3 years for women in their 68s and 14s.  BRCA-related cancer risk assessment.** / For women who have family members with a BRCA-related cancer (breast, ovarian, tubal, or peritoneal cancers).  Pap test.** / Every 2 years from ages 4 through 59. Every 3 years starting at age 48 through age 20 or 67 with a history of 3 consecutive normal Pap tests.  HPV screening.** / Every 3 years from ages 44 through ages 2 to 45 with a history of 3 consecutive normal Pap tests.  Hepatitis C blood test.** / For any individual with known risks for hepatitis C.  Skin self-exam. / Monthly.  Influenza vaccine. / Every year.  Tetanus, diphtheria, and acellular pertussis (Tdap, Td) vaccine.** / Consult your health care provider. Pregnant women should receive 1 dose of Tdap vaccine during each pregnancy. 1 dose of Td every 10 years.  Varicella vaccine.** / Consult your health care provider. Pregnant females who do not have evidence of immunity should receive the first dose after pregnancy.  HPV vaccine. / 3 doses over 6 months, if 92 and younger. The vaccine is not recommended for use in pregnant females. However, pregnancy testing is not needed before receiving a dose.  Measles, mumps, rubella (MMR) vaccine.** / You need at least 1 dose of MMR if  you were born in [birth year] or later. You may also need  a 2nd dose. For females of childbearing age, rubella immunity should be determined. If there is no evidence of immunity, females who are not pregnant should be vaccinated. If there is no evidence of immunity, females who are pregnant should delay immunization until after pregnancy.  Pneumococcal 13-valent conjugate (PCV13) vaccine.** / Consult your health care provider.  Pneumococcal polysaccharide (PPSV23) vaccine.** / 1 to 2 doses if you smoke cigarettes or if you have certain conditions.  Meningococcal vaccine.** / 1 dose if you are age 75 to 70 years and a Market researcher living in a residence hall, or have one of several medical conditions, you need to get vaccinated against meningococcal disease. You may also need additional booster doses.  Hepatitis A vaccine.** / Consult your health care provider.  Hepatitis B vaccine.** / Consult your health care provider.  Haemophilus influenzae type b (Hib) vaccine.** / Consult your health care provider. Ages 54 to 42 years  Blood pressure check.** / Every year.  Lipid and cholesterol check.** / Every 5 years beginning at age 70 years.  Lung cancer screening. / Every year if you are aged 71-80 years and have a 30-pack-year history of smoking and currently smoke or have quit within the past 15 years. Yearly screening is stopped once you have quit smoking for at least 15 years or develop a health problem that would prevent you from having lung cancer treatment.  Clinical breast exam.** / Every year after age 5 years.  BRCA-related cancer risk assessment.** / For women who have family members with a BRCA-related cancer (breast, ovarian, tubal, or peritoneal cancers).  Mammogram.** / Every year beginning at age 82 years and continuing for as long as you are in good health. Consult with your health care provider.  Pap test.** / Every 3 years starting at age 47 years through age 71 or 45 years with a history of 3 consecutive normal Pap  tests.  HPV screening.** / Every 3 years from ages 66 years through ages 28 to 60 years with a history of 3 consecutive normal Pap tests.  Fecal occult blood test (FOBT) of stool. / Every year beginning at age 38 years and continuing until age 53 years. You may not need to do this test if you get a colonoscopy every 10 years.  Flexible sigmoidoscopy or colonoscopy.** / Every 5 years for a flexible sigmoidoscopy or every 10 years for a colonoscopy beginning at age 91 years and continuing until age 48 years.  Hepatitis C blood test.** / For all people born from [birth year] through 1963 and any individual with known risks for hepatitis C.  Skin self-exam. / Monthly.  Influenza vaccine. / Every year.  Tetanus, diphtheria, and acellular pertussis (Tdap/Td) vaccine.** / Consult your health care provider. Pregnant women should receive 1 dose of Tdap vaccine during each pregnancy. 1 dose of Td every 10 years.  Varicella vaccine.** / Consult your health care provider. Pregnant females who do not have evidence of immunity should receive the first dose after pregnancy.  Zoster vaccine.** / 1 dose for adults aged 54 years or older.  Measles, mumps, rubella (MMR) vaccine.** / You need at least 1 dose of MMR if you were born in 1957 or later. You may also need a second dose. For females of childbearing age, rubella immunity should be determined. If there is no evidence of immunity, females who are not pregnant should be vaccinated. If there is no  evidence of immunity, females who are pregnant should delay immunization until after pregnancy.  Pneumococcal 13-valent conjugate (PCV13) vaccine.** / Consult your health care provider.  Pneumococcal polysaccharide (PPSV23) vaccine.** / 1 to 2 doses if you smoke cigarettes or if you have certain conditions.  Meningococcal vaccine.** / Consult your health care provider.  Hepatitis A vaccine.** / Consult your health care provider.  Hepatitis B vaccine.** / Consult  your health care provider.  Haemophilus influenzae type b (Hib) vaccine.** / Consult your health care provider. Ages 77 years and over  Blood pressure check.** / Every year.  Lipid and cholesterol check.** / Every 5 years beginning at age 40 years.  Lung cancer screening. / Every year if you are aged 12-80 years and have a 30-pack-year history of smoking and currently smoke or have quit within the past 15 years. Yearly screening is stopped once you have quit smoking for at least 15 years or develop a health problem that would prevent you from having lung cancer treatment.  Clinical breast exam.** / Every year after age 71 years.  BRCA-related cancer risk assessment.** / For women who have family members with a BRCA-related cancer (breast, ovarian, tubal, or peritoneal cancers).  Mammogram.** / Every year beginning at age 67 years and continuing for as long as you are in good health. Consult with your health care provider.  Pap test.** / Every 3 years starting at age 10 years through age 45 or 57 years with 3 consecutive normal Pap tests. Testing can be stopped between 65 and 70 years with 3 consecutive normal Pap tests and no abnormal Pap or HPV tests in the past 10 years.  HPV screening.** / Every 3 years from ages 48 years through ages 72 or 33 years with a history of 3 consecutive normal Pap tests. Testing can be stopped between 65 and 70 years with 3 consecutive normal Pap tests and no abnormal Pap or HPV tests in the past 10 years.  Fecal occult blood test (FOBT) of stool. / Every year beginning at age 1 years and continuing until age 45 years. You may not need to do this test if you get a colonoscopy every 10 years.  Flexible sigmoidoscopy or colonoscopy.** / Every 5 years for a flexible sigmoidoscopy or every 10 years for a colonoscopy beginning at age 39 years and continuing until age 3 years.  Hepatitis C blood test.** / For all people born from [birth year] through 1963 and any  individual with known risks for hepatitis C.  Osteoporosis screening.** / A one-time screening for women ages 87 years and over and women at risk for fractures or osteoporosis.  Skin self-exam. / Monthly.  Influenza vaccine. / Every year.  Tetanus, diphtheria, and acellular pertussis (Tdap/Td) vaccine.** / 1 dose of Td every 10 years.  Varicella vaccine.** / Consult your health care provider.  Zoster vaccine.** / 1 dose for adults aged 54 years or older.  Pneumococcal 13-valent conjugate (PCV13) vaccine.** / Consult your health care provider.  Pneumococcal polysaccharide (PPSV23) vaccine.** / 1 dose for all adults aged 52 years and older.  Meningococcal vaccine.** / Consult your health care provider.  Hepatitis A vaccine.** / Consult your health care provider.  Hepatitis B vaccine.** / Consult your health care provider.  Haemophilus influenzae type b (Hib) vaccine.** / Consult your health care provider. ** Family history and personal history of risk and conditions may change your health care provider's recommendations.   This information is not intended to replace advice given to  you by your health care provider. Make sure you discuss any questions you have with your health care provider.   Document Released: 04/17/2001 Document Revised: 03/12/2014 Document Reviewed: 07/17/2010 Elsevier Interactive Patient Education [employer].

## 2014-12-08 NOTE — Assessment & Plan Note (Signed)
(  1) During the course of the visit the patient was educated and counseled about appropriate screening and preventive services including:    Influenza vaccine  Screening electrocardiogram  Screening mammography  Bone densitometry screening  Nutrition counseling   Advanced directives: has an advanced directive - a copy HAS NOT been provided.  Diet review for nutrition referral? Yes ____  Not Indicated __x__

## 2014-12-08 NOTE — Progress Notes (Signed)
Pre visit review using our clinic review tool, if applicable. No additional management support is needed unless otherwise documented below in the visit note/SLS  

## 2014-12-08 NOTE — Progress Notes (Signed)
Subjective:    Tanya Harris is a 81 y.o. female who presents for Medicare Annual/Subsequent preventive examination.  Preventive Screening-Counseling & Management  Tobacco History  Smoking status  . Never Smoker   Smokeless tobacco  . Not on file     Problems Prior to Visit  Hyperlipidemia -- Not currently on medication. Has been previously controlled with diet and exercise. Patient fasting for labs. Is taking 81 mg ASA daily.   Hearing Loss -- Endorses some hearing loss over past year. Denies ever having audiology exam before. Does have history of cerumen impaction.  Current Problems (verified) Patient Active Problem List   Diagnosis Date Noted  . Hyperlipidemia 06/04/2014  . Medicare annual wellness visit, subsequent 11/30/2013  . 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 Osteopenia 11/02/2013  . Gait instability 11/02/2013    Medications Prior to Visit Current Outpatient Prescriptions on File Prior to Visit  Medication Sig Dispense Refill  . aspirin 81 MG tablet Take 81 mg by mouth daily.    . Calcium Carbonate-Vitamin D (CALCIUM 600+D) 600-400 MG-UNIT per tablet Take 2 tablets by mouth daily.    . fluticasone (FLONASE) 50 MCG/ACT nasal spray Place 2 sprays into both nostrils daily. 16 g 5  . Fluticasone-Salmeterol (ADVAIR) 250-50 MCG/DOSE AEPB Inhale 1 puff into the lungs 2 (two) times daily. 60 each 5  . glucosamine-chondroitin 500-400 MG tablet Take 2 tablets by mouth daily.    Marland Kitchen guaiFENesin (MUCINEX) 600 MG 12 hr tablet Take 600 mg by mouth daily as needed.     . meloxicam (MOBIC) 15 MG tablet Take 15 mg by mouth daily as needed for pain.    . Multiple Vitamin (MULTIVITAMIN) tablet Take 1 tablet by mouth daily.     No current facility-administered medications on file prior to visit.    Current Medications (verified) Current Outpatient Prescriptions  Medication Sig Dispense Refill  . aspirin 81 MG tablet Take 81 mg by mouth daily.    . Calcium  Carbonate-Vitamin D (CALCIUM 600+D) 600-400 MG-UNIT per tablet Take 2 tablets by mouth daily.    . fluticasone (FLONASE) 50 MCG/ACT nasal spray Place 2 sprays into both nostrils daily. 16 g 5  . Fluticasone-Salmeterol (ADVAIR) 250-50 MCG/DOSE AEPB Inhale 1 puff into the lungs 2 (two) times daily. 60 each 5  . glucosamine-chondroitin 500-400 MG tablet Take 2 tablets by mouth daily.    Marland Kitchen guaiFENesin (MUCINEX) 600 MG 12 hr tablet Take 600 mg by mouth daily as needed.     . meloxicam (MOBIC) 15 MG tablet Take 15 mg by mouth daily as needed for pain.    . metroNIDAZOLE (METROCREAM) 0.75 % cream Apply 1 application topically 2 (two) times daily.    . Multiple Vitamin (MULTIVITAMIN) tablet Take 1 tablet by mouth daily.     No current facility-administered medications for this visit.     Allergies (verified) Amoxicillin and Ibuprofen   PAST HISTORY  Family History Family History  Problem Relation Age of Onset  . Heart disease Mother 53    Deceased  . Diabetes Mother   . Heart disease Father 39    Deceased  . Asthma Father   . Heart disease Maternal Uncle   . Colon cancer Brother   . Diabetes Brother     #2  . Heart disease Brother     x2  . Healthy Sister   . Healthy Brother     x1  . Hyperlipidemia Son     x2  Social History Social History  Substance Use Topics  . Smoking status: Never Smoker   . Smokeless tobacco: Not on file  . Alcohol Use: Not on file    Are there smokers in your home (other than you)? No  Risk Factors Current exercise habits: Gym/ health club routine includes swimming, treadmill and water aerobics.  Dietary issues discussed: Body mass index is 29.9 kg/(m^2). Is watching intake   Cardiac risk factors: advanced age (older than 14 for men, 34 for women) and dyslipidemia.  Depression Screen (Note: if answer to either of the following is "Yes", a more complete depression screening is indicated)   Over the past two weeks, have you felt down, depressed  or hopeless? No  Over the past two weeks, have you felt little interest or pleasure in doing things? No  Have you lost interest or pleasure in daily life? No  Do you often feel hopeless? No  Do you cry easily over simple problems? No  Activities of Daily Living In your present state of health, do you have any difficulty performing the following activities?:  Driving? No Managing money?  No Feeding yourself? No Getting from bed to chair? No Climbing a flight of stairs? Yes -- walker Preparing food and eating?: No Bathing or showering? No Getting dressed: No Getting to the toilet? No Using the toilet:No Moving around from place to place: Yes -- walker In the past year have you fallen or had a near fall?:Yes -- Fractured risk due to balance issues. Is seeing PT presently   Are you sexually active?  No  Do you have more than one partner?  N/A  Hearing Difficulties: Yes Do you often ask people to speak up or repeat themselves? Yes Do you experience ringing or noises in your ears? Yes Do you have difficulty understanding soft or whispered voices? Yes   Do you feel that you have a problem with memory? No  Do you often misplace items? No  Do you feel safe at home?  Yes  Cognitive Testing  Alert? Yes  Normal Appearance?Yes  Oriented to person? Yes  Place? Yes   Time? Yes  Recall of three objects?  Yes  Can perform simple calculations? Yes  Displays appropriate judgment?Yes  Can read the correct time from a watch face?Yes   Advanced Directives have been discussed with the patient? Yes  List the Names of Other Physician/Practitioners you currently use: 1.  Dr. Susie Cassette -- Dermatology  Indicate any recent Medical Services you may have received from other than [facility] providers in the past year (date may be approximate).  Immunization History  Administered Date(s) Administered  . Influenza, High Dose Seasonal PF 11/30/2013  . Pneumococcal Conjugate-13 06/01/2014  . Zoster  03/05/2010    Screening Tests Health Maintenance  Topic Date Due  . TETANUS/TDAP  09/27/1951  . INFLUENZA VACCINE  10/04/2014  . PNA vac Low Risk Adult (2 of 2 - PPSV23) 06/01/2015  . DEXA SCAN  Completed  . ZOSTAVAX  Completed    All answers were reviewed with the patient and necessary referrals were made:  Leeanne Rio, PA-C   12/08/2014   History reviewed: allergies, current medications, past family history, past medical history, past social history, past surgical history and problem list  Review of Systems Pertinent items noted in HPI and remainder of comprehensive ROS otherwise negative.    Objective:     Vision by Snellen chart: right eye:20/20, left eye:20/25  Body mass index is 29.9 kg/(m^2).  BP 130/77 mmHg  Pulse 59  Temp(Src) 97.9 F (36.6 C) (Oral)  Resp 16  Ht 5\' 2"  (1.575 m)  Wt 163 lb 8 oz (74.163 kg)  BMI 29.90 kg/m2  SpO2 99%  General appearance: alert, cooperative, appears stated age and no distress Head: Normocephalic, without obvious abnormality, atraumatic Eyes: conjunctivae/corneas clear. PERRL, EOM's intact. Fundi benign. Ears: normal TM's and external ear canals both ears Nose: Nares normal. Septum midline. Mucosa normal. No drainage or sinus tenderness. Throat: lips, mucosa, and tongue normal; teeth and gums normal Lungs: clear to auscultation bilaterally Heart: regular rate and rhythm, S1, S2 normal, no murmur, click, rub or gallop Extremities: extremities normal, atraumatic, no cyanosis or edema Skin: Skin color, texture, turgor normal. No rashes or lesions     Assessment:     (1) Medicare Wellness, Subsequent (2) Hyperlipidemia  (3) Hearing Loss (4) Need for immunization against influenza     Plan:     (1) During the course of the visit the patient was educated and counseled about appropriate screening and preventive services including:    Influenza vaccine  Screening electrocardiogram  Screening  mammography  Bone densitometry screening  Nutrition counseling   Advanced directives: has an advanced directive - a copy HAS NOT been provided.  Diet review for nutrition referral? Yes ____  Not Indicated __x__  (2) Will obtain fasting lipid panel today along with CMP. (3) Referral to ENT and Audiology placed. (4) Flu shot given today.  Patient Instructions (the written plan) was given to the patient.  Medicare Attestation I have personally reviewed: The patient's medical and social history Their use of alcohol, tobacco or illicit drugs Their current medications and supplements The patient's functional ability including ADLs,fall risks, home safety risks, cognitive, and hearing and visual impairment Diet and physical activities Evidence for depression or mood disorders  The patient's weight, height, BMI, and visual acuity have been recorded in the chart.  I have made referrals, counseling, and provided education to the patient based on review of the above and I have provided the patient with a written personalized care plan for preventive services.     Raiford Noble [place], [place]   12/08/2014

## 2014-12-08 NOTE — Assessment & Plan Note (Signed)
Will obtain fasting lipid panel today along with CMP.

## 2014-12-08 NOTE — Assessment & Plan Note (Signed)
EKG with NSR. Continue asa daily.

## 2014-12-09 ENCOUNTER — Encounter (INDEPENDENT_AMBULATORY_CARE_PROVIDER_SITE_OTHER): Payer: Self-pay

## 2014-12-15 ENCOUNTER — Telehealth: Payer: Self-pay | Admitting: Physician Assistant

## 2014-12-15 NOTE — Telephone Encounter (Signed)
Received her prior colonoscopy report. Notes due to high risk they recommend colonscopy every 3 years which would make her due now. That being said she is 81 years old, with recent negative colonoscopies. Repeat screening at this point is up to her. Some GI specialists would not screen any further or at least decrease frequency to every 5 years. I will defer to her how she would like to proceed -- can set her up with one of our GI specialists for a consult regarding need for further colonoscopies if she would like.

## 2014-12-17 NOTE — Telephone Encounter (Signed)
FYI: Patient informed, understood & does not want to see GIi at present time; she will let us know if she decides to/SLS

## 2015-01-14 DIAGNOSIS — H6123 Impacted cerumen, bilateral: Secondary | ICD-10-CM | POA: Diagnosis not present

## 2015-01-14 DIAGNOSIS — H903 Sensorineural hearing loss, bilateral: Secondary | ICD-10-CM | POA: Diagnosis not present

## 2015-02-08 ENCOUNTER — Telehealth: Payer: Self-pay | Admitting: Physician Assistant

## 2015-02-08 NOTE — Telephone Encounter (Signed)
Patient had Flu Shot here 12/08/2014

## 2015-02-08 NOTE — Telephone Encounter (Signed)
Verified. Not sure why was not documented by CMA. HM updated.

## 2015-02-15 ENCOUNTER — Telehealth: Payer: Self-pay | Admitting: Physician Assistant

## 2015-02-15 DIAGNOSIS — R531 Weakness: Secondary | ICD-10-CM

## 2015-02-15 NOTE — Telephone Encounter (Signed)
Relation to [id number]  Call back number:[phone] Pharmacy:  Reason for call:  Patient requesting a referral to physical therapy in [facility]

## 2015-02-15 NOTE — Telephone Encounter (Signed)
Referral placed.

## 2015-02-22 ENCOUNTER — Ambulatory Visit: Payer: Medicare Other | Attending: Physician Assistant | Admitting: Physical Therapy

## 2015-02-22 DIAGNOSIS — R531 Weakness: Secondary | ICD-10-CM | POA: Insufficient documentation

## 2015-02-22 DIAGNOSIS — R269 Unspecified abnormalities of gait and mobility: Secondary | ICD-10-CM

## 2015-02-22 DIAGNOSIS — Z9181 History of falling: Secondary | ICD-10-CM | POA: Insufficient documentation

## 2015-02-22 NOTE — Therapy (Signed)
[facility] [address]  [place], [place], [address] Phone: [phone]   Fax:  [phone]  Physical Therapy Evaluation  Patient Details  Name: Tanya Harris MRN: [id number] Date of Birth: [birth year] Referring Provider: Raiford Noble, PA-C  Encounter Date: 02/22/2015      PT End of Session - 02/22/15 1605    Visit Number 1   Number of Visits 12   Date for PT Re-Evaluation 04/19/15   PT Start Time 1532   PT Stop Time 1604   PT Time Calculation (min) 32 min   Equipment Utilized During Treatment Gait belt   Activity Tolerance Patient tolerated treatment well   Behavior During Therapy [facility] for tasks assessed/performed      Past Medical History  Diagnosis Date  . COPD (chronic obstructive pulmonary disease) (HCC)     Mild  . Arthritis, rheumatoid ([place])     Spine  . Spinal stenosis   . Scoliosis   . DJD (degenerative joint disease) of knee     Past Surgical History  Procedure Laterality Date  . Total knee arthroplasty  2001    Left  . Appendectomy  2005  . Cataract extraction      Bilateral  . Nasal sinus surgery    . Tonsillectomy      There were no vitals filed for this visit.  Visit Diagnosis:  Abnormality of gait - Plan: PT plan of care cert/re-cert  Weakness - Plan: PT plan of care cert/re-cert  Risk for falls - Plan: PT plan of care cert/re-cert      Subjective Assessment - 02/22/15 1535    Subjective Pt is an 81 y/o female who presents to OPPT with difficulty walking due to degenerative arthritis.  Pt with fall in spring resulting in L wrist fx and therefore unable to continue with pool exercises.  Pt reports decreased strength and balance since fall.  Pt using rollator RW x 1 year   Patient Stated Goals improve mobility; walk upright with RW, improve strength and balance   Currently in Pain? No/denies  reports occasional arthritis pain            [facility] PT Assessment - 02/22/15  1538    Assessment   Medical Diagnosis weakness   Referring Provider Raiford Noble, PA-C   Onset Date/Surgical Date --  Feb 2016 following wrist fx   Next MD Visit PRN   Prior Therapy previously here 1 year ago for same condition   Precautions   Precautions Fall   Restrictions   Weight Bearing Restrictions No   Balance Screen   Has the patient fallen in the past 6 months No   Has the patient had a decrease in activity level because of a fear of falling?  Yes   Is the patient reluctant to leave their home because of a fear of falling?  No   Home Ecologist residence   Living Arrangements Spouse/significant other   Available Help at Discharge Family   Type of [place] to enter   Entrance Stairs-Number of Steps 3   Entrance Stairs-Rails Can reach both   [place] One level   [place] - 4 wheels;Cane - single point   Prior Function   Level of Independence Requires assistive device for independence   Vocation Retired   Leisure goes to fitness center 4-5 days/wk; water exercises and walking some   Cognition  Overall Cognitive Status Within Functional Limits for tasks assessed   Observation/Other Assessments   Focus on Therapeutic Outcomes (FOTO)  40 (60% limited; predicted 48% limited)   Strength   Overall Strength Comments suspect hip ext weakness given inability to stand without UE support   Strength Assessment Site Hip;Knee;Ankle   Right Hip Flexion 3/5   Right Hip ABduction 3/5   Left Hip Flexion 3/5   Left Hip ABduction 3/5   Right/Left Knee Right;Left   Right Knee Flexion 3/5   Right Knee Extension 4/5   Left Knee Flexion 3/5   Left Knee Extension 4/5   Right Ankle Dorsiflexion 3+/5   Left Ankle Dorsiflexion 3+/5   Transfers   Transfers Sit to Stand   Sit to Stand With upper extremity assist;Multiple attempts;5: Supervision   Sit to Stand Details (indicate cue type and reason) attempts to stand  without UE support unsuccessful   Ambulation/Gait   Ambulation Distance (Feet) 100 Feet   Assistive device 4-wheeled walker   Gait Pattern Narrow base of support;Trunk flexed;Lateral hip instability;Trendelenburg   Ambulation Surface Level;Indoor   Gait velocity 2.93 ft/sec   Standardized Balance Assessment   Standardized Balance Assessment Berg Balance Test;Timed Up and Go Test   Berg Balance Test   Sit to Stand Able to stand  independently using hands   Standing Unsupported Able to stand safely 2 minutes   Sitting with Back Unsupported but Feet Supported on Floor or Stool Able to sit safely and securely 2 minutes   Stand to Sit Controls descent by using hands   Transfers Able to transfer with verbal cueing and /or supervision   Standing Unsupported with Eyes Closed Able to stand 10 seconds with supervision   Standing Ubsupported with Feet Together Able to place feet together independently and stand for 1 minute with supervision   From Standing, Reach Forward with Outstretched Arm Can reach confidently >25 cm (10")   From Standing Position, Pick up Object from [place] to pick up shoe safely and easily   From Standing Position, Turn to Look Behind Over each Shoulder Looks behind one side only/other side shows less weight shift   Turn 360 Degrees Needs close supervision or verbal cueing   Standing Unsupported, Alternately Place Feet on Step/Stool Able to complete >2 steps/needs minimal assist   Standing Unsupported, One Foot in Front Able to take small step independently and hold 30 seconds   Standing on One Leg Tries to lift leg/unable to hold 3 seconds but remains standing independently   Total Score 38   Timed Up and Go Test   Normal TUG (seconds) 11.27                             PT Short Term Goals - 02/22/15 1608    PT SHORT TERM GOAL #1   Title independent with HEP (03/15/15)   Time 3   Period Weeks   Status New           PT Long Term Goals -  02/22/15 1608    PT LONG TERM GOAL #1   Title improve BERG balance score to >/= 45/56 for improved balance and decreased fall risk. (04/05/15)   Time 6   Period Weeks   Status New   PT LONG TERM GOAL #2   Title ambulate > 350' with RW modified independent for improved functional mobility (04/05/15)   Time 4   Period Weeks  Status New   PT LONG TERM GOAL #3   Title perform sit to stand with 1 UE support x 10 reps to demonstrate improved lower extremity strength (04/05/15)   Time 6   Period Weeks   Status New               Plan - 03/02/2015 1606    Clinical Impression Statement Pt is an 81 y/o female who presents to OPPT with weakness and decreased balance.  Pt demonstrates high fall risk as indicated by BERG balance score of 38/56 and significant gait deviations due to lower extremity weakness.  Pt will beneift from PT to maximize function and decrease fall risk.   Pt will benefit from skilled therapeutic intervention in order to improve on the following deficits Abnormal gait;Difficulty walking;Decreased strength;Decreased mobility;Decreased balance   Rehab Potential Good   PT Frequency 2x / week   PT Duration 8 weeks  anticipate d/c in 6 weeks   PT Treatment/Interventions ADLs/Self Care Home Management;Functional mobility training;Therapeutic activities;Therapeutic exercise;Balance training;Neuromuscular re-education;Electrical Stimulation;Patient/family education;Gait training;Stair training   PT Next Visit Plan OTAGO balance program; lower extremity strengthening   Consulted and Agree with Plan of Care Patient          G-Codes - Mar 02, 2015 1612    Functional Assessment Tool Used BERG 38/56   Functional Limitation Mobility: Walking and moving around   Mobility: Walking and Moving Around Current Status [id number]) At least 20 percent but less than 40 percent impaired, limited or restricted   Mobility: Walking and Moving Around Goal Status [id number]) At least 1 percent but less than  20 percent impaired, limited or restricted       Problem List Patient Active Problem List   Diagnosis Date Noted  . Screening, ischemic heart disease 12/08/2014  . Hearing loss 12/08/2014  . Hyperlipidemia 06/04/2014  . Medicare annual wellness visit, subsequent 11/30/2013  . 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 Osteopenia 11/02/2013  . Gait instability 11/02/2013   Laureen Abrahams, PT, DPT 03-02-15 4:14 PM  [facility] [address]  [place], [place], [address] Phone: [phone]   Fax:  [phone]  Name: Tanya Harris MRN: [id number] Date of Birth: [birth year]

## 2015-03-08 ENCOUNTER — Ambulatory Visit: Payer: Medicare Other | Attending: Physician Assistant | Admitting: Physical Therapy

## 2015-03-08 DIAGNOSIS — Z9181 History of falling: Secondary | ICD-10-CM

## 2015-03-08 DIAGNOSIS — R269 Unspecified abnormalities of gait and mobility: Secondary | ICD-10-CM

## 2015-03-08 DIAGNOSIS — R531 Weakness: Secondary | ICD-10-CM | POA: Diagnosis not present

## 2015-03-08 NOTE — Therapy (Signed)
[facility] [address]  [place], [place], [address] Phone: [phone]   Fax:  [phone]  Physical Therapy Treatment  Patient Details  Name: Tanya Harris MRN: [id number] Date of Birth: 06-Jan-1933 Referring Provider: Raiford Noble, PA-C  Encounter Date: 03/08/2015      PT End of Session - 03/08/15 1610    Visit Number 2   Number of Visits 12   Date for PT Re-Evaluation 04/19/15   PT Start Time 7353   PT Stop Time 1610   PT Time Calculation (min) 40 min   Activity Tolerance Patient tolerated treatment well   Behavior During Therapy [facility] for tasks assessed/performed      Past Medical History  Diagnosis Date  . COPD (chronic obstructive pulmonary disease) (HCC)     Mild  . Arthritis, rheumatoid ([place])     Spine  . Spinal stenosis   . Scoliosis   . DJD (degenerative joint disease) of knee     Past Surgical History  Procedure Laterality Date  . Total knee arthroplasty  2001    Left  . Appendectomy  2005  . Cataract extraction      Bilateral  . Nasal sinus surgery    . Tonsillectomy      There were no vitals filed for this visit.  Visit Diagnosis:  Abnormality of gait  Weakness  Risk for falls      Subjective Assessment - 03/08/15 1533    Subjective No complaints; "just I can't walk."  Reports forward flexed posture with walking for too long.   Patient Stated Goals improve mobility; walk upright with RW, improve strength and balance   Currently in Pain? No/denies                         [facility] Adult PT Treatment/Exercise - 03/08/15 0001    Knee/Hip Exercises: Aerobic   Nustep L6 x 5 min             Balance Exercises - 03/08/15 1534    OTAGO PROGRAM   Head Movements Sitting;5 reps   Neck Movements Sitting;5 reps   Trunk Movements Standing;5 reps   Ankle Movements Sitting;10 reps   Knee Extensor 10 reps;Weight (comment)  2#   Knee Flexor 10 reps;Weight  (comment)  2#   Hip ABductor 10 reps;Weight (comment)  2#   Ankle Plantorflexors 20 reps, support   Ankle Dorsiflexors 20 reps, support   Knee Bends 20 reps, support   Backwards Walking Support   Walking and Turning Around Assistive device   Sideways Walking Assistive device   Tandem Stance 10 seconds, support   Tandem Walk Support   One Leg Stand 10 seconds, support   Heel Walking Support   Toe Walk Support   Sit to Stand 20 reps, one support  10 reps           PT Education - 03/08/15 1610    Education provided Yes   Education Details issued OTAGO home program - see flowsheet   Person(s) Educated Patient   Methods Explanation;Demonstration;Handout   Comprehension Verbalized understanding;Returned demonstration          PT Short Term Goals - 02/22/15 1608    PT SHORT TERM GOAL #1   Title independent with HEP (03/15/15)   Time 3   Period Weeks   Status New           PT Long Term Goals - 02/22/15  [place] #1   Title improve BERG balance score to >/= 45/56 for improved balance and decreased fall risk. (04/05/15)   Time 6   Period Weeks   Status New   PT LONG TERM GOAL #2   Title ambulate > 350' with RW modified independent for improved functional mobility (04/05/15)   Time 4   Period Weeks   Status New   PT LONG TERM GOAL #3   Title perform sit to stand with 1 UE support x 10 reps to demonstrate improved lower extremity strength (04/05/15)   Time 6   Period Weeks   Status New               Plan - 03/08/15 1611    Clinical Impression Statement Issued OTAGO HEP for pt to perform at home.  No goals met yet as only 2nd visit.   PT Next Visit Plan lower ext strengthening; OTAGO PRN for review; balance exercises   Consulted and Agree with Plan of Care Patient        Problem List Patient Active Problem List   Diagnosis Date Noted  . Screening, ischemic heart disease 12/08/2014  . Hearing loss 12/08/2014  . Hyperlipidemia  06/04/2014  . Medicare annual wellness visit, subsequent 11/30/2013  . COPD with chronic bronchitis ([place]) 11/02/2013  . Osteopenia 11/02/2013  . Gait instability 11/02/2013   Laureen Abrahams, PT, DPT 03/08/2015 4:13 PM  [facility] [address]  [place], [place], [address] Phone: [phone]   Fax:  [phone]  Name: Tanya Harris MRN: [id number] Date of Birth: [birth year]

## 2015-03-10 ENCOUNTER — Ambulatory Visit: Payer: Medicare Other | Admitting: Physical Therapy

## 2015-03-10 DIAGNOSIS — Z9181 History of falling: Secondary | ICD-10-CM

## 2015-03-10 DIAGNOSIS — R269 Unspecified abnormalities of gait and mobility: Secondary | ICD-10-CM | POA: Diagnosis not present

## 2015-03-10 DIAGNOSIS — R531 Weakness: Secondary | ICD-10-CM | POA: Diagnosis not present

## 2015-03-10 NOTE — Therapy (Signed)
[facility] [address]  [place], [place], [address] Phone: [phone]   Fax:  [phone]  Physical Therapy Treatment  Patient Details  Name: Tanya Harris MRN: [id number] Date of Birth: Sep 16, 1932 Referring Provider: Raiford Noble, PA-C  Encounter Date: 03/10/2015      PT End of Session - 03/10/15 1125    Visit Number 3   Number of Visits 12   Date for PT Re-Evaluation 04/19/15   PT Start Time 1030   PT Stop Time 1111   PT Time Calculation (min) 41 min   Equipment Utilized During Treatment Gait belt   Activity Tolerance Patient tolerated treatment well   Behavior During Therapy [facility] for tasks assessed/performed      Past Medical History  Diagnosis Date  . COPD (chronic obstructive pulmonary disease) (HCC)     Mild  . Arthritis, rheumatoid ([place])     Spine  . Spinal stenosis   . Scoliosis   . DJD (degenerative joint disease) of knee     Past Surgical History  Procedure Laterality Date  . Total knee arthroplasty  2001    Left  . Appendectomy  2005  . Cataract extraction      Bilateral  . Nasal sinus surgery    . Tonsillectomy      There were no vitals filed for this visit.  Visit Diagnosis:  Abnormality of gait  Weakness  Risk for falls      Subjective Assessment - 03/10/15 1030    Subjective A little sore the other day after PT.     Patient Stated Goals improve mobility; walk upright with RW, improve strength and balance   Currently in Pain? No/denies                         [facility] Adult PT Treatment/Exercise - 03/10/15 1032    Ambulation/Gait   Gait Comments Amb 180' with SPC with minguard A; min cues for posture   Exercises   Exercises Knee/Hip   Knee/Hip Exercises: Standing   Heel Raises 20 reps;Both   Knee/Hip Exercises: Seated   Long Arc Quad Both;10 reps;Weights   Long Arc Quad Weight 2 lbs.   Clamshell with TheraBand Green  x 10   Marching Both;10  reps;Weights   Marching Weights 2 lbs.   Hamstring Curl Both;10 reps  green theraband   Knee/Hip Exercises: Supine   Hip Adduction Isometric Both;10 reps   Bridges Both;10 reps   Straight Leg Raises Both;10 reps   Other Supine Knee/Hip Exercises single limb clamshell with green theraband x 10 bil             Balance Exercises - 03/10/15 1102    Balance Exercises: Standing   Standing Eyes Opened Narrow base of support (BOS);Wide (BOA);Head turns;Solid surface;Foam/compliant surface  head turns x 10   Standing Eyes Closed Narrow base of support (BOS);Wide (BOA);Head turns;Solid surface;5 reps;10 secs  head turns x 10             PT Short Term Goals - 02/22/15 1608    PT SHORT TERM GOAL #1   Title independent with HEP (03/15/15)   Time 3   Period Weeks   Status New           PT Long Term Goals - 02/22/15 1608    PT LONG TERM GOAL #1   Title improve BERG balance score to >/= 45/56 for improved balance and  decreased fall risk. (04/05/15)   Time 6   Period Weeks   Status New   PT LONG TERM GOAL #2   Title ambulate > 350' with RW modified independent for improved functional mobility (04/05/15)   Time 4   Period Weeks   Status New   PT LONG TERM GOAL #3   Title perform sit to stand with 1 UE support x 10 reps to demonstrate improved lower extremity strength (04/05/15)   Time 6   Period Weeks   Status New               Plan - 03/10/15 1126    Clinical Impression Statement Pt amb 180' with minguard A with SPC today; and stated she would like to be able to amb into church and restaurants with [facility].  Progressing well towards goals and initiated corner balance exercises today.     PT Next Visit Plan lower ext strengthening; OTAGO PRN for review; balance exercises   Consulted and Agree with Plan of Care Patient        Problem List Patient Active Problem List   Diagnosis Date Noted  . Screening, ischemic heart disease 12/08/2014  . Hearing loss 12/08/2014   . Hyperlipidemia 06/04/2014  . Medicare annual wellness visit, subsequent 11/30/2013  . COPD with chronic bronchitis ([place]) 11/02/2013  . Osteopenia 11/02/2013  . Gait instability 11/02/2013   Laureen Abrahams, PT, DPT 03/10/2015 11:28 AM  [facility] [address]  [place], [place], [address] Phone: [phone]   Fax:  [phone]  Name: Tanya Harris MRN: [id number] Date of Birth: 11-14-1932

## 2015-03-14 ENCOUNTER — Ambulatory Visit: Payer: Medicare Other | Admitting: Rehabilitation

## 2015-03-17 ENCOUNTER — Ambulatory Visit: Payer: Medicare Other | Admitting: Physical Therapy

## 2015-03-17 DIAGNOSIS — R269 Unspecified abnormalities of gait and mobility: Secondary | ICD-10-CM | POA: Diagnosis not present

## 2015-03-17 DIAGNOSIS — Z9181 History of falling: Secondary | ICD-10-CM

## 2015-03-17 DIAGNOSIS — R531 Weakness: Secondary | ICD-10-CM

## 2015-03-17 NOTE — Therapy (Signed)
[facility] [address]  [place], [place], [address] Phone: [phone]   Fax:  [phone]  Physical Therapy Treatment  Patient Details  Name: Tanya Harris MRN: [id number] Date of Birth: February 09, 1933 Referring Provider: Raiford Noble, PA-C  Encounter Date: 03/17/2015      PT End of Session - 03/17/15 1523    Visit Number 4   Number of Visits 12   Date for PT Re-Evaluation 04/19/15   PT Start Time 1440   PT Stop Time 1523   PT Time Calculation (min) 43 min   Equipment Utilized During Treatment Gait belt   Activity Tolerance Patient tolerated treatment well   Behavior During Therapy [facility] for tasks assessed/performed      Past Medical History  Diagnosis Date  . COPD (chronic obstructive pulmonary disease) (HCC)     Mild  . Arthritis, rheumatoid ([place])     Spine  . Spinal stenosis   . Scoliosis   . DJD (degenerative joint disease) of knee     Past Surgical History  Procedure Laterality Date  . Total knee arthroplasty  2001    Left  . Appendectomy  2005  . Cataract extraction      Bilateral  . Nasal sinus surgery    . Tonsillectomy      There were no vitals filed for this visit.  Visit Diagnosis:  Abnormality of gait  Weakness  Risk for falls      Subjective Assessment - 03/17/15 1445    Subjective Doing well; did exercises every day during snow.  Exercised at gym yesterday.     Patient Stated Goals improve mobility; walk upright with RW, improve strength and balance   Currently in Pain? No/denies                         [facility] Adult PT Treatment/Exercise - 03/17/15 1449    Knee/Hip Exercises: Aerobic   Nustep L5 x 5 min   Knee/Hip Exercises: Seated   Ball Squeeze 10 x 5 sec   Clamshell with TheraBand Green  x10 bil; x 10 single limb bil   Knee/Hip Exercises: Supine   Bridges Both;10 reps   Bridges Limitations on orange pball   Straight Leg Raises Both;10 reps   Straight Leg Raises Limitations 2#; min cues to decrease extensor lag   Other Supine Knee/Hip Exercises hip flexion x 10 bil with 2#; foot on floor to mat   Other Supine Knee/Hip Exercises isometric hip ext x 10 bil on orange pball   Knee/Hip Exercises: Sidelying   Hip ABduction Both;10 reps   Clams x 10 bil with green tband                  PT Short Term Goals - 03/17/15 1525    PT SHORT TERM GOAL #1   Title independent with HEP (03/15/15)   Status Achieved           PT Long Term Goals - 03/17/15 1525    PT LONG TERM GOAL #1   Title improve BERG balance score to >/= 45/56 for improved balance and decreased fall risk. (04/05/15)   Status On-going   PT LONG TERM GOAL #2   Title ambulate > 350' with RW modified independent for improved functional mobility (04/05/15)   Status On-going   PT LONG TERM GOAL #3   Title perform sit to stand with 1 UE support x 10 reps to  demonstrate improved lower extremity strength (04/05/15)   Status On-going               Plan - 03/17/15 1524    Clinical Impression Statement Session focused on strengthening and gait with SPC.  Pt needs mod cues to decrease substitution with hip abduction exercises.   PT Next Visit Plan lower ext strengthening; OTAGO PRN for review; balance exercises   Consulted and Agree with Plan of Care Patient        Problem List Patient Active Problem List   Diagnosis Date Noted  . Screening, ischemic heart disease 12/08/2014  . Hearing loss 12/08/2014  . Hyperlipidemia 06/04/2014  . Medicare annual wellness visit, subsequent 11/30/2013  . COPD with chronic bronchitis ([place]) 11/02/2013  . Osteopenia 11/02/2013  . Gait instability 11/02/2013   Laureen Abrahams, PT, DPT 03/17/2015 3:28 PM  [facility] [address]  [place], [place], [address] Phone: [phone]   Fax:  [phone]  Name: Tanya Harris MRN: [id number] Date of  Birth: 12-05-1932

## 2015-03-21 ENCOUNTER — Encounter: Payer: Self-pay | Admitting: Rehabilitation

## 2015-03-21 ENCOUNTER — Ambulatory Visit: Payer: Medicare Other | Admitting: Rehabilitation

## 2015-03-21 DIAGNOSIS — R269 Unspecified abnormalities of gait and mobility: Secondary | ICD-10-CM | POA: Diagnosis not present

## 2015-03-21 DIAGNOSIS — R531 Weakness: Secondary | ICD-10-CM | POA: Diagnosis not present

## 2015-03-21 DIAGNOSIS — Z9181 History of falling: Secondary | ICD-10-CM

## 2015-03-21 NOTE — Therapy (Signed)
[facility] [address]  [place], [place], [address] Phone: [phone]   Fax:  [phone]  Physical Therapy Treatment  Patient Details  Name: Tanya Harris MRN: [id number] Date of Birth: [birth year] Referring Provider: Raiford Noble, PA-C  Encounter Date: 03/21/2015      PT End of Session - 03/21/15 1430    Visit Number 5   Number of Visits 12   Date for PT Re-Evaluation 04/19/15   PT Start Time 1400   PT Stop Time 1430   PT Time Calculation (min) 30 min   Activity Tolerance Patient tolerated treatment well      Past Medical History  Diagnosis Date  . COPD (chronic obstructive pulmonary disease) (HCC)     Mild  . Arthritis, rheumatoid ([place])     Spine  . Spinal stenosis   . Scoliosis   . DJD (degenerative joint disease) of knee     Past Surgical History  Procedure Laterality Date  . Total knee arthroplasty  2001    Left  . Appendectomy  2005  . Cataract extraction      Bilateral  . Nasal sinus surgery    . Tonsillectomy      There were no vitals filed for this visit.  Visit Diagnosis:  Abnormality of gait  Weakness  Risk for falls      Subjective Assessment - 03/21/15 1347    Subjective doing well.  wants to try the treadmill   Currently in Pain? No/denies                         [facility] Adult PT Treatment/Exercise - 03/21/15 0001    Knee/Hip Exercises: Aerobic   Tread Mill 1.57mph x 74min   Knee/Hip Exercises: Standing   Forward Step Up Both;1 set;10 reps;Hand Hold: 1   Forward Step Up Limitations at counter onto foam   Walking with Sports Cord side steps GTB at counter x 4 lengths each   Knee/Hip Exercises: Seated   Long Arc Quad Both;10 reps;Weights   Long Arc Quad Weight 2 lbs.   Ball Squeeze 10 x 5 sec   Clamshell with TheraBand Green  x 20   Marching Both;10 reps;Weights   Marching Weights 2 lbs.   Hamstring Curl Both;10 reps    Hamstring Weights 2 lbs.    Knee/Hip Exercises: Supine   Bridges Both;10 reps   Bridges Limitations on orange pball   Straight Leg Raises Both;10 reps   Straight Leg Raises Limitations 2#; min cues to decrease extensor lag   Other Supine Knee/Hip Exercises isometric hip ext x 10 bil on orange pball   Knee/Hip Exercises: Sidelying   Hip ABduction Both;10 reps   Clams x 10 bil with green tband                  PT Short Term Goals - 03/17/15 1525    PT SHORT TERM GOAL #1   Title independent with HEP (03/15/15)   Status Achieved           PT Long Term Goals - 03/17/15 1525    PT LONG TERM GOAL #1   Title improve BERG balance score to >/= 45/56 for improved balance and decreased fall risk. (04/05/15)   Status On-going   PT LONG TERM GOAL #2   Title ambulate > 350' with RW modified independent for improved functional mobility (04/05/15)   Status On-going   PT LONG  TERM GOAL #3   Title perform sit to stand with 1 UE support x 10 reps to demonstrate improved lower extremity strength (04/05/15)   Status On-going               Plan - 03/21/15 1431    Clinical Impression Statement tolerated all well.  Added more standing TE today with good performance   PT Next Visit Plan lower ext strengthening; OTAGO PRN for review; balance exercises        Problem List Patient Active Problem List   Diagnosis Date Noted  . Screening, ischemic heart disease 12/08/2014  . Hearing loss 12/08/2014  . Hyperlipidemia 06/04/2014  . Medicare annual wellness visit, subsequent 11/30/2013  . COPD with chronic bronchitis ([place]) 11/02/2013  . Osteopenia 11/02/2013  . Gait instability 11/02/2013    Stark Bray, DPT, CMP 03/21/2015, 2:32 PM  [facility] [address]  [place], [place], [address] Phone: [phone]   Fax:  [phone]  Name: Tanya Harris MRN: [id number] Date of Birth: 07-13-1932

## 2015-03-24 ENCOUNTER — Ambulatory Visit: Payer: Medicare Other | Admitting: Physical Therapy

## 2015-03-24 DIAGNOSIS — R531 Weakness: Secondary | ICD-10-CM | POA: Diagnosis not present

## 2015-03-24 DIAGNOSIS — R269 Unspecified abnormalities of gait and mobility: Secondary | ICD-10-CM

## 2015-03-24 DIAGNOSIS — Z9181 History of falling: Secondary | ICD-10-CM | POA: Diagnosis not present

## 2015-03-24 NOTE — Therapy (Signed)
[facility] [address] [facility] [address]  [place], [place], [address] Phone: [phone]   Fax:  [phone]  Physical Therapy Treatment  Patient Details  Name: Tanya Harris MRN: [id number] Date of Birth: 1932-04-06 Referring Provider: Raiford Noble, PA-C  Encounter Date: 03/24/2015      PT End of Session - 03/24/15 1425    Visit Number 6   Number of Visits 12   Date for PT Re-Evaluation 04/19/15   PT Start Time 1340   PT Stop Time 1419   PT Time Calculation (min) 39 min   Equipment Utilized During Treatment Gait belt   Activity Tolerance Patient tolerated treatment well   Behavior During Therapy [facility] for tasks assessed/performed      Past Medical History  Diagnosis Date  . COPD (chronic obstructive pulmonary disease) (HCC)     Mild  . Arthritis, rheumatoid ([place])     Spine  . Spinal stenosis   . Scoliosis   . DJD (degenerative joint disease) of knee     Past Surgical History  Procedure Laterality Date  . Total knee arthroplasty  2001    Left  . Appendectomy  2005  . Cataract extraction      Bilateral  . Nasal sinus surgery    . Tonsillectomy      There were no vitals filed for this visit.  Visit Diagnosis:  Abnormality of gait  Weakness  Risk for falls      Subjective Assessment - 03/24/15 1345    Subjective Pt is doing well feels like walking is a "little better"   Currently in Pain? No/denies                         [facility] Adult PT Treatment/Exercise - 03/24/15 0001    Knee/Hip Exercises: Aerobic   Tread Mill 1.4 x6 min   Knee/Hip Exercises: Seated   Marching Both;2 sets;10 reps;Weights   Marching Limitations 2#   Hamstring Curl Both;1 set;10 reps   Hamstring Limitations green theraband   Knee/Hip Exercises: Supine   Bridges with [employer] Strengthening;Both;2 sets;10 reps  difficulty with squeezing and lifting all the way up   Knee/Hip Exercises: Sidelying   Hip  ABduction Both;2 sets;10 reps   Hip ABduction Limitations cuing to not ER LE      Balance Exercises-  Side steps on foam log; length of foam log L and R x 5; 1 UE min support Step on and over foam log x 10; 1 UE min support Step over foam log x 10; 1 UE mod support Corner balance on foam pad; feet apart, eyes closed, head turn L and R x 10; feet together, eyes closed, head turn L and R x 10; no UE support with negligible LOB; pt grabbed for chair immediately as eyes opened each time             PT Short Term Goals - 03/17/15 1525    PT SHORT TERM GOAL #1   Title independent with HEP (03/15/15)   Status Achieved           PT Long Term Goals - 03/17/15 1525    PT LONG TERM GOAL #1   Title improve BERG balance score to >/= 45/56 for improved balance and decreased fall risk. (04/05/15)   Status On-going   PT LONG TERM GOAL #2   Title ambulate > 350' with RW modified independent for improved functional mobility (04/05/15)  Status On-going   PT LONG TERM GOAL #3   Title perform sit to stand with 1 UE support x 10 reps to demonstrate improved lower extremity strength (04/05/15)   Status On-going               Plan - 03/24/15 1427    Clinical Impression Statement Pt tolerated treadmill well with min cuing to increase stride length. Balance exercises were tolerated well; pt able to use min to no UE support with some cuing as noted above.      PT Next Visit Plan lower ext strengthening; balance exercises    Consulted and Agree with Plan of Care Patient        Problem List Patient Active Problem List   Diagnosis Date Noted  . Screening, ischemic heart disease 12/08/2014  . Hearing loss 12/08/2014  . Hyperlipidemia 06/04/2014  . Medicare annual wellness visit, subsequent 11/30/2013  . COPD with chronic bronchitis ([place]) 11/02/2013  . Osteopenia 11/02/2013  . Gait instability 11/02/2013    Ammie Ferrier 03/24/2015, 2:41 PM  [facility] [address]  [place], [place], [address] Phone: [phone]   Fax:  [phone]  Name: Tanya Harris MRN: [id number] Date of Birth: 02/17/1933

## 2015-03-28 ENCOUNTER — Encounter: Payer: Self-pay | Admitting: Rehabilitation

## 2015-03-28 ENCOUNTER — Ambulatory Visit: Payer: Medicare Other | Admitting: Rehabilitation

## 2015-03-28 DIAGNOSIS — R531 Weakness: Secondary | ICD-10-CM | POA: Diagnosis not present

## 2015-03-28 DIAGNOSIS — R269 Unspecified abnormalities of gait and mobility: Secondary | ICD-10-CM

## 2015-03-28 DIAGNOSIS — Z9181 History of falling: Secondary | ICD-10-CM

## 2015-03-28 NOTE — Therapy (Signed)
[facility] [address]  [place], [place], [address] Phone: [phone]   Fax:  [phone]  Physical Therapy Treatment  Patient Details  Name: Tanya Harris MRN: [id number] Date of Birth: [birth year] Referring Provider: Raiford Noble, PA-C  Encounter Date: 03/28/2015      PT End of Session - 03/28/15 1143    Visit Number 7   Number of Visits 12   Date for PT Re-Evaluation 04/19/15   PT Start Time 1104   PT Stop Time 1145   PT Time Calculation (min) 41 min   Activity Tolerance Patient tolerated treatment well      Past Medical History  Diagnosis Date  . COPD (chronic obstructive pulmonary disease) (HCC)     Mild  . Arthritis, rheumatoid ([place])     Spine  . Spinal stenosis   . Scoliosis   . DJD (degenerative joint disease) of knee     Past Surgical History  Procedure Laterality Date  . Total knee arthroplasty  2001    Left  . Appendectomy  2005  . Cataract extraction      Bilateral  . Nasal sinus surgery    . Tonsillectomy      There were no vitals filed for this visit.  Visit Diagnosis:  Abnormality of gait  Weakness  Risk for falls      Subjective Assessment - 03/28/15 1104    Subjective no complaints   Currently in Pain? No/denies                                   PT Short Term Goals - 03/17/15 1525    PT SHORT TERM GOAL #1   Title independent with HEP (03/15/15)   Status Achieved           PT Long Term Goals - 03/17/15 1525    PT LONG TERM GOAL #1   Title improve BERG balance score to >/= 45/56 for improved balance and decreased fall risk. (04/05/15)   Status On-going   PT LONG TERM GOAL #2   Title ambulate > 350' with RW modified independent for improved functional mobility (04/05/15)   Status On-going   PT LONG TERM GOAL #3   Title perform sit to stand with 1 UE support x 10 reps to demonstrate improved lower extremity strength (04/05/15)   Status On-going     Todays Treatment Treadmill gait 1.27mph x 27min Seated LAQ 3# 5"x10 bil Seated march alt 3# x 15 Hooklying ball squeeze 10"x5 Ball squeeze with bridge x 10  At counter: With foam balance beam: side steps x 4 lengths, wt shift F/B x 10, tandem stance bil 2x30" each all with CGA Step ups onto theradisc x 10bil SL stance work 3x max bil CGA Random perturbations all directions with normal stance x 79min without LOB Gait x 2 laps in clinic with Platter - 03/28/15 1145    Clinical Impression Statement tolerated all well.  increased sway with Eyes closed  and with SL work on the R LE   PT Next Visit Plan lower ext strengthening; balance exercises         Problem List Patient Active Problem List   Diagnosis Date Noted  . Screening, ischemic heart disease 12/08/2014  . Hearing loss 12/08/2014  . Hyperlipidemia 06/04/2014  . Medicare annual wellness visit, subsequent  11/30/2013  . COPD with chronic bronchitis ([place]) 11/02/2013  . Osteopenia 11/02/2013  . Gait instability 11/02/2013    Stark Bray, DPT, CMP 03/28/2015, 11:48 AM  [facility] [address]  [place], [place], [address] Phone: [phone]   Fax:  [phone]  Name: Tanya Harris MRN: [id number] Date of Birth: 1932-06-23

## 2015-03-31 ENCOUNTER — Ambulatory Visit: Payer: Medicare Other | Admitting: Physical Therapy

## 2015-03-31 DIAGNOSIS — R269 Unspecified abnormalities of gait and mobility: Secondary | ICD-10-CM | POA: Diagnosis not present

## 2015-03-31 DIAGNOSIS — R531 Weakness: Secondary | ICD-10-CM | POA: Diagnosis not present

## 2015-03-31 DIAGNOSIS — Z9181 History of falling: Secondary | ICD-10-CM | POA: Diagnosis not present

## 2015-03-31 NOTE — Therapy (Signed)
[facility] [address]  [place], [place], [address] Phone: [phone]   Fax:  [phone]  Physical Therapy Treatment  Patient Details  Name: Tanya Harris MRN: [id number] Date of Birth: 08-May-1932 Referring Provider: Raiford Noble, PA-C  Encounter Date: 03/31/2015      PT End of Session - 03/31/15 1530    Visit Number 8   Number of Visits 12   Date for PT Re-Evaluation 04/19/15   PT Start Time 1440   PT Stop Time 1524   PT Time Calculation (min) 44 min   Equipment Utilized During Treatment Gait belt   Activity Tolerance Patient tolerated treatment well   Behavior During Therapy [facility] for tasks assessed/performed      Past Medical History  Diagnosis Date  . COPD (chronic obstructive pulmonary disease) (HCC)     Mild  . Arthritis, rheumatoid ([place])     Spine  . Spinal stenosis   . Scoliosis   . DJD (degenerative joint disease) of knee     Past Surgical History  Procedure Laterality Date  . Total knee arthroplasty  2001    Left  . Appendectomy  2005  . Cataract extraction      Bilateral  . Nasal sinus surgery    . Tonsillectomy      There were no vitals filed for this visit.  Visit Diagnosis:  Abnormality of gait  Weakness  Risk for falls      Subjective Assessment - 03/31/15 1452    Subjective Pt feels good. She stated that she has noticed the past week that her hip feels stronger and and that she can do more.   Currently in Pain? No/denies                         [facility] Adult PT Treatment/Exercise - 03/31/15 1457    Ambulation/Gait   Ambulation Distance (Feet) 300 Feet   Assistive device 4-wheeled walker   Gait Pattern Step-through pattern;Trendelenburg  slight trunk flex but improved   Ambulation Surface Level;Indoor   Knee/Hip Exercises: Hydrologist Both;3 reps;20 seconds   Passive Hamstring Stretch Limitations belt   Knee/Hip  Exercises: Aerobic   Tread Mill 1.5 x 6 min   Knee/Hip Exercises: Seated   Sit to [employer] 10 reps;with UE support  1 hand, pt had more difficulty with last 3 reps   Knee/Hip Exercises: Supine   Bridges with [employer] Strengthening;10 reps   Bridges with [employer] Strengthening;Both;10 reps   Knee/Hip Exercises: Sidelying   Hip ABduction Both;2 sets;10 reps   Hip ABduction Limitations 2#       Balance Exercises: Standing in corner on foam pad with wide BOS reaching for ball; pt was stable throughout Standing in corner on foam pad with narrow BOS reaching for ball; pt swayed with one LOB Tandem walking x 7 feet, 1 UE support Tandem walking x 6 lengths on foam beam, 1 UE support; pt unable to maintain tandem footing though no LOB          PT Education - 03/31/15 1529    Education provided Yes   Education Details hamstring stretch for HEP and in the swimming pool   Person(s) Educated Patient   Methods Explanation;Demonstration   Comprehension Verbalized understanding;Returned demonstration          PT Short Term Goals - 03/17/15 1525    PT SHORT TERM GOAL #1  Title independent with HEP (03/15/15)   Status Achieved           PT Long Term Goals - 03/31/15 1530    PT LONG TERM GOAL #1   Title improve BERG balance score to >/= 45/56 for improved balance and decreased fall risk. (04/14/15)   Status On-going   PT LONG TERM GOAL #2   Title ambulate > 350' with RW modified independent for improved functional mobility (04/14/15)   Status Achieved   PT LONG TERM GOAL #3   Title perform sit to stand with 1 UE support x 10 reps to demonstrate improved lower extremity strength (04/14/15)  able to perform but fatigues at about 7th rep   Status Partially Met               Plan - 03/31/15 1533    Clinical Impression Statement Pt tolerated all exercises well and met LTG #2. LTGs were extended due to 1 wk of missed therapy. Pt able to do more dynamic balance exercises  though minor LOB with reaching and small BOS on foam pad.    PT Next Visit Plan lower ext strengthening (standing); balance exercises    Consulted and Agree with Plan of Care Patient        Problem List Patient Active Problem List   Diagnosis Date Noted  . Screening, ischemic heart disease 12/08/2014  . Hearing loss 12/08/2014  . Hyperlipidemia 06/04/2014  . Medicare annual wellness visit, subsequent 11/30/2013  . COPD with chronic bronchitis ([place]) 11/02/2013  . Osteopenia 11/02/2013  . Gait instability 11/02/2013      Ammie Ferrier, SPT 03/31/2015  4:11 PM   [facility] [address]  [place], [place], [address] Phone: [phone]   Fax:  [phone]  Name: Tanya Harris MRN: [id number] Date of Birth: 1933/02/16

## 2015-04-04 ENCOUNTER — Ambulatory Visit: Payer: Medicare Other | Admitting: Rehabilitation

## 2015-04-04 DIAGNOSIS — Z9181 History of falling: Secondary | ICD-10-CM | POA: Diagnosis not present

## 2015-04-04 DIAGNOSIS — R531 Weakness: Secondary | ICD-10-CM | POA: Diagnosis not present

## 2015-04-04 DIAGNOSIS — R269 Unspecified abnormalities of gait and mobility: Secondary | ICD-10-CM | POA: Diagnosis not present

## 2015-04-04 NOTE — Therapy (Signed)
[facility] [address]  [place], [place], [address] Phone: [phone]   Fax:  [phone]  Physical Therapy Treatment  Patient Details  Name: Tanya Harris MRN: [id number] Date of Birth: [birth year] Referring Provider: Raiford Noble, PA-C  Encounter Date: 04/04/2015      PT End of Session - 04/04/15 1425    Visit Number 9   Number of Visits 12   Date for PT Re-Evaluation 04/19/15   PT Start Time 1400   PT Stop Time 1438   PT Time Calculation (min) 38 min   Activity Tolerance Patient tolerated treatment well      Past Medical History  Diagnosis Date  . COPD (chronic obstructive pulmonary disease) (HCC)     Mild  . Arthritis, rheumatoid ([place])     Spine  . Spinal stenosis   . Scoliosis   . DJD (degenerative joint disease) of knee     Past Surgical History  Procedure Laterality Date  . Total knee arthroplasty  2001    Left  . Appendectomy  2005  . Cataract extraction      Bilateral  . Nasal sinus surgery    . Tonsillectomy      There were no vitals filed for this visit.  Visit Diagnosis:  Abnormality of gait  Weakness  Risk for falls      Subjective Assessment - 04/04/15 1401    Subjective would like to work on strengthening the R hip a bit more   Currently in Pain? No/denies        Todays Treatment Treadmill gait 1.38mh x 666m Seated LAQ 5# 2x10 bil Hooklying ball squeeze 10"x5 Ball squeeze with bridge x 10  At treadmill: Side step ups 6" x 15 bil  Hip abduction 2# x12 bil Hip extension 2# x 12 bil March 2# x 12 bil SL stance work 6x max bil CGA  Gait x 50083fin clinic with [facility] pt having to grab onto wall x 2 in the hallway;                             PT Short Term Goals - 03/17/15 1525    PT SHORT TERM GOAL #1   Title independent with HEP (03/15/15)   Status Achieved           PT Long Term Goals - 03/31/15 1530    PT LONG TERM GOAL  #1   Title improve BERG balance score to >/= 45/56 for improved balance and decreased fall risk. (04/14/15)   Status On-going   PT LONG TERM GOAL #2   Title ambulate > 350' with RW modified independent for improved functional mobility (04/14/15)   Status Achieved   PT LONG TERM GOAL #3   Title perform sit to stand with 1 UE support x 10 reps to demonstrate improved lower extremity strength (04/14/15)  able to perform but fatigues at about 7th rep   Status Partially Met               Plan - 04/04/15 1425    Clinical Impression Statement more standing exercises today and more global R LE strengthening due to pt request.  improved ability to perform standing single leg abduction today compared to previous visits   PT Next Visit Plan lower ext strengthening (standing); balance exercises         Problem List Patient Active Problem List  Diagnosis Date Noted  . Screening, ischemic heart disease 12/08/2014  . Hearing loss 12/08/2014  . Hyperlipidemia 06/04/2014  . Medicare annual wellness visit, subsequent 11/30/2013  . COPD with chronic bronchitis ([place]) 11/02/2013  . Osteopenia 11/02/2013  . Gait instability 11/02/2013    Stark Bray, DPT, CMP 04/04/2015, 2:38 PM  [facility] [address]  [place], [place], [address] Phone: [phone]   Fax:  [phone]  Name: Tanya Harris MRN: [id number] Date of Birth: December 26, 1932

## 2015-04-07 ENCOUNTER — Ambulatory Visit: Payer: Medicare Other | Attending: Physician Assistant | Admitting: Physical Therapy

## 2015-04-07 DIAGNOSIS — R531 Weakness: Secondary | ICD-10-CM | POA: Insufficient documentation

## 2015-04-07 DIAGNOSIS — Z9181 History of falling: Secondary | ICD-10-CM | POA: Diagnosis not present

## 2015-04-07 DIAGNOSIS — R269 Unspecified abnormalities of gait and mobility: Secondary | ICD-10-CM | POA: Diagnosis not present

## 2015-04-07 NOTE — Therapy (Signed)
[facility] [address]  [place], [place], [address] Phone: [phone]   Fax:  [phone]  Physical Therapy Treatment  Patient Details  Name: Tanya Harris MRN: [id number] Date of Birth: 1932/08/16 Referring Provider: Raiford Noble, PA-C  Encounter Date: 04/07/2015      PT End of Session - 04/07/15 1534    Visit Number 10   Number of Visits 12   Date for PT Re-Evaluation 04/19/15   PT Start Time 3570   PT Stop Time 1523   PT Time Calculation (min) 37 min   Activity Tolerance Patient tolerated treatment well   Behavior During Therapy [facility] for tasks assessed/performed      Past Medical History  Diagnosis Date  . COPD (chronic obstructive pulmonary disease) (HCC)     Mild  . Arthritis, rheumatoid ([place])     Spine  . Spinal stenosis   . Scoliosis   . DJD (degenerative joint disease) of knee     Past Surgical History  Procedure Laterality Date  . Total knee arthroplasty  2001    Left  . Appendectomy  2005  . Cataract extraction      Bilateral  . Nasal sinus surgery    . Tonsillectomy      There were no vitals filed for this visit.  Visit Diagnosis:  Abnormality of gait  Weakness  Risk for falls      Subjective Assessment - 04/07/15 1448    Subjective Pt reports more back problems "not pain just difficulty with standing up straight". Pt also stated that she want more hip exercises because she feels like that is limiting her walking.   Currently in Pain? --  back "uncomfortable", 4/10            [facility] PT Assessment - 04/07/15 0001    Observation/Other Assessments   Focus on Therapeutic Outcomes (FOTO)  40 (60% limited; predicted 48% limited)                     [facility] Adult PT Treatment/Exercise - 04/07/15 1451    Neuro Re-ed    Neuro Re-ed Details  partial and full tandem standing on foam pad without UE support, added head turns up-down and side-side for each with min  CGA; walking 180 feet with SPC with head turns up-down and side-side   Knee/Hip Exercises: Aerobic   Tread Mill 1.6 x 6 min   Knee/Hip Exercises: Standing   Hip Flexion Stengthening;Both;2 sets;10 reps;Knee bent   Hip Abduction Stengthening;Both;2 sets;10 reps;Knee straight   Abduction Limitations x10 no weight, x 10 2#, cues to not ER   Hip Extension Stengthening;Both;2 sets;10 reps;Knee straight   Extension Limitations 2#, cues to stand up straight   Knee/Hip Exercises: Seated   Sit to Sand 1 set;10 reps;with UE support  1 UE, needed a rest at 5   Knee/Hip Exercises: Sidelying   Hip ABduction Strengthening;Both;2 sets;10 reps   Hip ABduction Limitations cues to slow and prevent ER                PT Education - 04/07/15 1534    Education provided No          PT Short Term Goals - 03/17/15 1525    PT SHORT TERM GOAL #1   Title independent with HEP (03/15/15)   Status Achieved           PT Long Term Goals - 03/31/15 1530  PT LONG TERM GOAL #1   Title improve BERG balance score to >/= 45/56 for improved balance and decreased fall risk. (04/14/15)   Status On-going   PT LONG TERM GOAL #2   Title ambulate > 350' with RW modified independent for improved functional mobility (04/14/15)   Status Achieved   PT LONG TERM GOAL #3   Title perform sit to stand with 1 UE support x 10 reps to demonstrate improved lower extremity strength (04/14/15)  able to perform but fatigues at about 7th rep   Status Partially Met               Plan - 04/07/15 1535    Clinical Impression Statement Pt had slightly more difficulty completing tasks today though tolerated all exercises well. Pt continues to request hip strengthening exercises to help with walking. Balance is improving though pt continues to need cuing to stand up straight with ambulation. Pt will benefit from continued hip strengthening to aid with ambulation and standing endurance.  No change in FOTO score.    PT  Next Visit Plan LE strengthening, progressed hip strengthening HEP; progressed balance exercises   Consulted and Agree with Plan of Care Patient        Problem List Patient Active Problem List   Diagnosis Date Noted  . Screening, ischemic heart disease 12/08/2014  . Hearing loss 12/08/2014  . Hyperlipidemia 06/04/2014  . Medicare annual wellness visit, subsequent 11/30/2013  . COPD with chronic bronchitis ([place]) 11/02/2013  . Osteopenia 11/02/2013  . Gait instability 11/02/2013      Ammie Ferrier, SPT 04/07/2015  3:48 PM  [facility] [address]  [place], [place], [address] Phone: [phone]   Fax:  [phone]  Name: Sukanya Goldblatt MRN: [id number] Date of Birth: 01-21-1933

## 2015-04-11 ENCOUNTER — Ambulatory Visit: Payer: Medicare Other | Admitting: Physical Therapy

## 2015-04-11 DIAGNOSIS — R531 Weakness: Secondary | ICD-10-CM | POA: Diagnosis not present

## 2015-04-11 DIAGNOSIS — Z9181 History of falling: Secondary | ICD-10-CM | POA: Diagnosis not present

## 2015-04-11 DIAGNOSIS — R269 Unspecified abnormalities of gait and mobility: Secondary | ICD-10-CM | POA: Diagnosis not present

## 2015-04-11 NOTE — Therapy (Signed)
[facility] [address]  [place], [place], [address] Phone: [phone]   Fax:  [phone]  Physical Therapy Treatment  Patient Details  Name: Tanya Harris MRN: [id number] Date of Birth: 1932-05-15 Referring Provider: Raiford Noble, PA-C  Encounter Date: 04/11/2015      PT End of Session - 04/11/15 1530    Visit Number 11   Number of Visits 12   Date for PT Re-Evaluation 04/19/15   PT Start Time 8413   PT Stop Time 1525   PT Time Calculation (min) 40 min   Equipment Utilized During Treatment Gait belt   Activity Tolerance Patient tolerated treatment well   Behavior During Therapy [facility] for tasks assessed/performed      Past Medical History  Diagnosis Date  . COPD (chronic obstructive pulmonary disease) (HCC)     Mild  . Arthritis, rheumatoid ([place])     Spine  . Spinal stenosis   . Scoliosis   . DJD (degenerative joint disease) of knee     Past Surgical History  Procedure Laterality Date  . Total knee arthroplasty  2001    Left  . Appendectomy  2005  . Cataract extraction      Bilateral  . Nasal sinus surgery    . Tonsillectomy      There were no vitals filed for this visit.  Visit Diagnosis:  Abnormality of gait  Weakness  Risk for falls      Subjective Assessment - 04/11/15 1457    Subjective Pt reports feeling much better than last Thursday.    Currently in Pain? No/denies                         [facility] Adult PT Treatment/Exercise - 04/11/15 1501    Knee/Hip Exercises: Aerobic   Tread Mill 1.7 x 7 min   Knee/Hip Exercises: Standing   Knee Flexion Strengthening;Both;2 sets;10 reps   Knee Flexion Limitations 2#   Hip Flexion Stengthening;Both;2 sets;10 reps;Knee bent   Hip Flexion Limitations 3#   Hip ADduction Strengthening;Both;2 sets;20 reps   Hip ADduction Limitations 2#   Hip Abduction Stengthening;Both;2 sets;10 reps;Knee straight   Abduction Limitations  3#   SLS 3x 30 sec both LE; tried no UE assist but needed both hand/finger touch down R SLS> L SLS   Knee/Hip Exercises: Seated   Sit to Sand 2 sets;10 reps;with UE support  10 w/ 2 UE, R LE push off more, 10 w/ 1 UE equal push off                PT Education - 04/11/15 1530    Education provided No          PT Short Term Goals - 03/17/15 1525    PT SHORT TERM GOAL #1   Title independent with HEP (03/15/15)   Status Achieved           PT Long Term Goals - 03/31/15 1530    PT LONG TERM GOAL #1   Title improve BERG balance score to >/= 45/56 for improved balance and decreased fall risk. (04/14/15)   Status On-going   PT LONG TERM GOAL #2   Title ambulate > 350' with RW modified independent for improved functional mobility (04/14/15)   Status Achieved   PT LONG TERM GOAL #3   Title perform sit to stand with 1 UE support x 10 reps to demonstrate improved lower extremity strength (04/14/15)  able to perform but fatigues at about 7th rep   Status Partially Met               Plan - 04/11/15 1531    Clinical Impression Statement Pt is progressing well and tolerated all standing exercises well today. Pt continues to exhibit weakness with R hip and a fear of relying on R LE for support. Pt will benefit from progressed hip strengthening. Pt will also need LTG assessed next session including BERG balance.    PT Next Visit Plan BERG, LE strengthening, progressed hip strengthening HEP; progressed balance exercises   Consulted and Agree with Plan of Care Patient        Problem List Patient Active Problem List   Diagnosis Date Noted  . Screening, ischemic heart disease 12/08/2014  . Hearing loss 12/08/2014  . Hyperlipidemia 06/04/2014  . Medicare annual wellness visit, subsequent 11/30/2013  . COPD with chronic bronchitis ([place]) 11/02/2013  . Osteopenia 11/02/2013  . Gait instability 11/02/2013     Ammie Ferrier, SPT 04/11/2015  3:36 PM  [facility] [address]  [place], [place], [address] Phone: [phone]   Fax:  [phone]  Name: Tanya Harris MRN: [id number] Date of Birth: 1932/06/21

## 2015-04-14 ENCOUNTER — Ambulatory Visit: Payer: Medicare Other | Admitting: Physical Therapy

## 2015-04-14 DIAGNOSIS — R531 Weakness: Secondary | ICD-10-CM

## 2015-04-14 DIAGNOSIS — R269 Unspecified abnormalities of gait and mobility: Secondary | ICD-10-CM | POA: Diagnosis not present

## 2015-04-14 DIAGNOSIS — Z9181 History of falling: Secondary | ICD-10-CM

## 2015-04-14 NOTE — Therapy (Signed)
[facility] [address]  [place], [place], [address] Phone: [phone]   Fax:  [phone]  Physical Therapy Treatment  Patient Details  Name: Tanya Harris MRN: [id number] Date of Birth: 1932/12/10 Referring Provider: Raiford Noble, PA-C  Encounter Date: 04/14/2015      PT End of Session - 04/14/15 1155    Visit Number 12   Number of Visits 16   Date for PT Re-Evaluation 04/29/15   PT Start Time 1116   PT Stop Time 1154   PT Time Calculation (min) 38 min   Equipment Utilized During Treatment Gait belt   Activity Tolerance Patient tolerated treatment well   Behavior During Therapy [facility] for tasks assessed/performed      Past Medical History  Diagnosis Date  . COPD (chronic obstructive pulmonary disease) (HCC)     Mild  . Arthritis, rheumatoid ([place])     Spine  . Spinal stenosis   . Scoliosis   . DJD (degenerative joint disease) of knee     Past Surgical History  Procedure Laterality Date  . Total knee arthroplasty  2001    Left  . Appendectomy  2005  . Cataract extraction      Bilateral  . Nasal sinus surgery    . Tonsillectomy      There were no vitals filed for this visit.  Visit Diagnosis:  Abnormality of gait  Weakness  Risk for falls      Subjective Assessment - 04/14/15 1125    Subjective Pt reports being tired today from already standing for awhile. Pt reports still having difficulty standing up straight and Rt hip weakness.   Currently in Pain? No/denies            [facility] PT Assessment - 04/14/15 1125    Strength   Right Hip Flexion 3/5   Right Hip ABduction 2+/5   Left Hip Flexion 3/5   Left Hip ABduction 3/5   Right Knee Flexion 5/5   Right Knee Extension 4/5   Left Knee Flexion 5/5   Left Knee Extension 4/5   Right Ankle Dorsiflexion 4-/5   Left Ankle Dorsiflexion 5/5   Ambulation/Gait   Gait Comments Sit to stand x 10 with UE support able to do with some fatigue  with last 2 reps   Berg Balance Test   Sit to Stand Able to stand  independently using hands   Standing Unsupported Able to stand safely 2 minutes   Sitting with Back Unsupported but Feet Supported on Floor or Stool Able to sit safely and securely 2 minutes   Stand to Sit Sits safely with minimal use of hands   Transfers Able to transfer safely, definite need of hands   Standing Unsupported with Eyes Closed Able to stand 10 seconds safely   Standing Ubsupported with Feet Together Able to place feet together independently and stand 1 minute safely   From Standing, Reach Forward with Outstretched Arm Can reach confidently >25 cm (10")   From Standing Position, Pick up Object from Floor Able to pick up shoe safely and easily   From Standing Position, Turn to Look Behind Over each Shoulder Looks behind from both sides and weight shifts well   Turn 360 Degrees Able to turn 360 degrees safely one side only in 4 seconds or less   Standing Unsupported, Alternately Place Feet on Step/Stool Able to complete 4 steps without aid or supervision   Standing Unsupported, One Foot in  Front Able to plae foot ahead of the other independently and hold 30 seconds   Standing on One Leg Tries to lift leg/unable to hold 3 seconds but remains standing independently   Total Score 47                     [facility] Adult PT Treatment/Exercise - 04/14/15 1125    Knee/Hip Exercises: Aerobic   Stationary Bike L 1.5 x 5 min                PT Education - 04/14/15 1200    Education provided No          PT Short Term Goals - 03/17/15 1525    PT SHORT TERM GOAL #1   Title independent with HEP (03/15/15)   Status Achieved           PT Long Term Goals - 04/14/15 1208    PT LONG TERM GOAL #1   Title improve BERG balance score to >/= 45/56 for improved balance and decreased fall risk. (04/14/15)   Time 6   Period Weeks   Status Achieved   PT LONG TERM GOAL #2   Title ambulate > 350' with RW  modified independent for improved functional mobility (04/14/15)   Time 4   Period Weeks   Status Achieved   PT LONG TERM GOAL #3   Title perform sit to stand with 1 UE support x 10 reps to demonstrate improved lower extremity strength (04/14/15)   Time 6   Period Weeks   Status Achieved   PT LONG TERM GOAL #4   Title Pt will be independent with advanced HEP in order to maintain strength and balance gains made at PT (04/29/2015)   Time 2   Period Weeks   Status New   PT LONG TERM GOAL #5   Title Pt will be able to navigate paved sidewalks, inculding an incline and a decline with a SPC and supervision for improved community ambulation (04/29/2015)    Time 2   Period Weeks   Status New   Additional Long Term Goals   Additional Long Term Goals Yes   PT LONG TERM GOAL #6   Title Pt will score 52% (48% limited) on FOTO to indicate functional improvements (04/29/2015)   Time 2   Period Weeks   Status New               Plan - 04/14/15 1201    Clinical Impression Statement Pt goals were reassessed today. Pt BERG improved to 47/56 and pt was able to go sit to stand x 10 with 1 UE. Pt continues to fatigue and rely more maximally on the 1 UE support after 6 reps of going sit to stand. Pt MMT showed cotinued hip and knee weakness R>L. Pt would benefit from more OPPT in order to improve hip and knee strength to aid with appropriate ambulation. Pt continues to be motivated to make improvements and do what she needs to do at home. Pt will benefit from progressed HEP to maintain all gains made during PT.   Pt will benefit from skilled therapeutic intervention in order to improve on the following deficits Abnormal gait;Difficulty walking;Decreased strength;Decreased mobility;Decreased balance   Rehab Potential Good   PT Frequency 2x / week   PT Duration 2 weeks   PT Treatment/Interventions ADLs/Self Care Home Management;Functional mobility training;Therapeutic activities;Therapeutic  exercise;Balance training;Neuromuscular re-education;Electrical Stimulation;Patient/family education;Gait training;Stair training   PT Next Visit Plan progress  hip strengthening HEP, balance exercises    Consulted and Agree with Plan of Care Patient        Problem List Patient Active Problem List   Diagnosis Date Noted  . Screening, ischemic heart disease 12/08/2014  . Hearing loss 12/08/2014  . Hyperlipidemia 06/04/2014  . Medicare annual wellness visit, subsequent 11/30/2013  . COPD with chronic bronchitis ([place]) 11/02/2013  . Osteopenia 11/02/2013  . Gait instability 11/02/2013     Ammie Ferrier, SPT 04/14/2015  2:06 PM  [facility] [address]  [place], [place], [address] Phone: [phone]   Fax:  [phone]  Name: Caneshia Goughnour MRN: [id number] Date of Birth: 1932/07/09

## 2015-04-19 ENCOUNTER — Ambulatory Visit: Payer: Medicare Other | Admitting: Physical Therapy

## 2015-04-19 DIAGNOSIS — R531 Weakness: Secondary | ICD-10-CM | POA: Diagnosis not present

## 2015-04-19 DIAGNOSIS — R269 Unspecified abnormalities of gait and mobility: Secondary | ICD-10-CM | POA: Diagnosis not present

## 2015-04-19 DIAGNOSIS — Z9181 History of falling: Secondary | ICD-10-CM | POA: Diagnosis not present

## 2015-04-19 NOTE — Therapy (Signed)
[facility] [address]  [place], [place], [address] Phone: [phone]   Fax:  [phone]  Physical Therapy Treatment  Patient Details  Name: Tanya Harris MRN: [id number] Date of Birth: 1932-04-20 Referring Provider: Raiford Noble, PA-C  Encounter Date: 04/19/2015      PT End of Session - 04/19/15 1425    Visit Number 13   Number of Visits 16   Date for PT Re-Evaluation 04/29/15   PT Start Time [id number]   PT Stop Time 1421   PT Time Calculation (min) 44 min   Equipment Utilized During Treatment Gait belt   Activity Tolerance Patient tolerated treatment well   Behavior During Therapy [facility] for tasks assessed/performed      Past Medical History  Diagnosis Date  . COPD (chronic obstructive pulmonary disease) (HCC)     Mild  . Arthritis, rheumatoid ([place])     Spine  . Spinal stenosis   . Scoliosis   . DJD (degenerative joint disease) of knee     Past Surgical History  Procedure Laterality Date  . Total knee arthroplasty  2001    Left  . Appendectomy  2005  . Cataract extraction      Bilateral  . Nasal sinus surgery    . Tonsillectomy      There were no vitals filed for this visit.  Visit Diagnosis:  Abnormality of gait  Weakness  Risk for falls      Subjective Assessment - 04/19/15 1422    Subjective Pt reported being good. Pt asked about exercises to help her back so that she can stand up more straight.    Currently in Pain? No/denies                         [facility] Adult PT Treatment/Exercise - 04/19/15 1425    Knee/Hip Exercises: Aerobic   Tread Mill 1.7 x 6 min   Knee/Hip Exercises: Standing   Hip Flexion Stengthening;Both;2 sets;10 reps;Knee straight   Hip Flexion Limitations red TB   Hip ADduction Strengthening;Both;2 sets;10 reps   Hip ADduction Limitations red TB   Hip Abduction Stengthening;Both;2 sets;10 reps;Knee straight   Abduction Limitations red TB   Hip  Extension Stengthening;Both;2 sets;10 reps;Knee straight   Extension Limitations red TB, cueing to control returning to neutral   Other Standing Knee Exercises toe raises x 10 both together with majority of motion from hip flexion; x10 single toe raise Lt and Rt with min hip flexion compensation   Knee/Hip Exercises: Seated   Other Seated Knee/Hip Exercises toe raises 2x10 both together      Ambulation with SPC x approx 76 ft each: Walking forward with cue to look straight ahead and stand up tall Walking forward with head turns Lt and Rt (pt drifted slightly towards the wall with self-correction) Walking forward with head nods up and down (cue to hold looking up for longer) Walking with listing off animals for each letter of the alphabet (last few feet pt stopped listing animals due to difficulty thinking of animals to list off, one catch of the Lt foot during the activity)  Obstacle course: Figure "S" around cones, go around desk sticking out, and stepping over the  foam roll (pt was cued to watch cane placement as it got knocked against desk and initially had difficulty stepping over with education about stopping and taking time to get weaker LE over first and then follow with  the stronger LE, improved with education)           PT Education - 04/19/15 1424    Education provided Yes   Education Details seated toe raises and single leg toe raises in standing    Person(s) Educated Patient   Methods Explanation;Demonstration   Comprehension Verbalized understanding;Returned demonstration          PT Short Term Goals - 03/17/15 1525    PT SHORT TERM GOAL #1   Title independent with HEP (03/15/15)   Status Achieved           PT Long Term Goals - 04/14/15 1208    PT LONG TERM GOAL #1   Title improve BERG balance score to >/= 45/56 for improved balance and decreased fall risk. (04/14/15)   Time 6   Period Weeks   Status Achieved   PT LONG TERM GOAL #2   Title ambulate >  350' with RW modified independent for improved functional mobility (04/14/15)   Time 4   Period Weeks   Status Achieved   PT LONG TERM GOAL #3   Title perform sit to stand with 1 UE support x 10 reps to demonstrate improved lower extremity strength (04/14/15)   Time 6   Period Weeks   Status Achieved   PT LONG TERM GOAL #4   Title Pt will be independent with advanced HEP in order to maintain strength and balance gains made at PT (04/29/2015)   Time 2   Period Weeks   Status New   PT LONG TERM GOAL #5   Title Pt will be able to navigate paved sidewalks, inculding an incline and a decline with a SPC and supervision for improved community ambulation (04/29/2015)    Time 2   Period Weeks   Status New   Additional Long Term Goals   Additional Long Term Goals Yes   PT LONG TERM GOAL #6   Title Pt will score 52% (48% limited) on FOTO to indicate functional improvements (04/29/2015)   Time 2   Period Weeks   Status New               Plan - 04/19/15 1441    Clinical Impression Statement Pt tolerated all exercises well. Pt had difficulty with maintaining an upright standing posture and looking ahead throughout ambulation and standing exercises. Modified toe raises for HEP so that pt could correctly do with little to no compensation. Pt would continue to benefit from hip strenghtening exercises in standing and SPC ambulation training for shorter distances.   PT Next Visit Plan assess ability to safely complete standing 4-way hip at home; [facility] ambulation outside with incline/decline, core/hip strengthening    Consulted and Agree with Plan of Care Patient        Problem List Patient Active Problem List   Diagnosis Date Noted  . Screening, ischemic heart disease 12/08/2014  . Hearing loss 12/08/2014  . Hyperlipidemia 06/04/2014  . Medicare annual wellness visit, subsequent 11/30/2013  . COPD with chronic bronchitis ([place]) 11/02/2013  . Osteopenia 11/02/2013  . Gait instability  11/02/2013    Ammie Ferrier, SPT 04/19/2015  4:06 PM  [facility] [address]  [place], [place], [address] Phone: [phone]   Fax:  [phone]  Name: Tanya Harris MRN: [id number] Date of Birth: 03-25-1932

## 2015-04-21 ENCOUNTER — Ambulatory Visit: Payer: Medicare Other

## 2015-04-21 DIAGNOSIS — R269 Unspecified abnormalities of gait and mobility: Secondary | ICD-10-CM | POA: Diagnosis not present

## 2015-04-21 DIAGNOSIS — Z9181 History of falling: Secondary | ICD-10-CM

## 2015-04-21 DIAGNOSIS — R531 Weakness: Secondary | ICD-10-CM

## 2015-04-21 NOTE — Therapy (Signed)
[facility] [address]  [place], [place], [address] Phone: [phone]   Fax:  [phone]  Physical Therapy Treatment  Patient Details  Name: Tanya Harris MRN: [id number] Date of Birth: Dec 14, 1932 Referring Provider: Raiford Noble, PA-C  Encounter Date: 04/21/2015      PT End of Session - 04/21/15 0821    Visit Number 14   Number of Visits 16   Date for PT Re-Evaluation 04/29/15   PT Start Time 0800   PT Stop Time 0844   PT Time Calculation (min) 44 min   Equipment Utilized During Treatment Gait belt   Activity Tolerance Patient tolerated treatment well   Behavior During Therapy [facility] for tasks assessed/performed      Past Medical History  Diagnosis Date  . COPD (chronic obstructive pulmonary disease) (HCC)     Mild  . Arthritis, rheumatoid ([place])     Spine  . Spinal stenosis   . Scoliosis   . DJD (degenerative joint disease) of knee     Past Surgical History  Procedure Laterality Date  . Total knee arthroplasty  2001    Left  . Appendectomy  2005  . Cataract extraction      Bilateral  . Nasal sinus surgery    . Tonsillectomy      There were no vitals filed for this visit.  Visit Diagnosis:  Abnormality of gait  Weakness  Risk for falls      Subjective Assessment - 04/21/15 0805    Subjective No pain or other complaints reported.     Patient Stated Goals improve mobility; walk upright with RW, improve strength and balance   Currently in Pain? No/denies   Multiple Pain Sites No       Therex: sitting heel raises 2 x 10 with therapist resistance Sitting LAQ with therapist resistance 2 x 10 reps each leg  Sitting LAQ / hamstring curls with therapist resistance each way x 10 each leg  Standing hip 4-way with red TB next to counter x 10 each way; pt. reported haven't done this as part of HEP yet Hook-lying bridging x 10 reps; activity suspended at 8 reps due to L HS cramp Hook-lying  clam shells with red TB 2 x 10 reps          [facility] Adult PT Treatment/Exercise - 04/21/15 1009    Ambulation/Gait   Ambulation Distance (Feet) 200 Feet   Assistive device Straight cane   Gait Pattern Step-through pattern;Trendelenburg   Ambulation Surface Level;Indoor;Outdoor;Paved   Ramp 5: Supervision   Ramp Details (indicate cue type and reason) Pt. able to ascend / descend ramp outdoors x 2 with supervisiont and single point cane however required decreased gait speed to navigate safely.            PT Short Term Goals - 03/17/15 1525    PT SHORT TERM GOAL #1   Title independent with HEP (03/15/15)   Status Achieved           PT Long Term Goals - 04/21/15 1015    PT LONG TERM GOAL #1   Title improve BERG balance score to >/= 45/56 for improved balance and decreased fall risk. (04/14/15)   Time 6   Period Weeks   Status Achieved   PT LONG TERM GOAL #2   Title ambulate > 350' with RW modified independent for improved functional mobility (04/14/15)   Time 4   Period Weeks   Status Achieved  PT LONG TERM GOAL #3   Title perform sit to stand with 1 UE support x 10 reps to demonstrate improved lower extremity strength (04/14/15)   Time 6   Period Weeks   Status Achieved   PT LONG TERM GOAL #4   Title Pt will be independent with advanced HEP in order to maintain strength and balance gains made at PT (04/29/2015)   Time 2   Period Weeks   Status On-going   PT LONG TERM GOAL #5   Title Pt will be able to navigate paved sidewalks, inculding an incline and a decline with a SPC and supervision for improved community ambulation (04/29/2015)   04/21/2015: Pt. alble to ascend/ descend ramp outdoors with [facility] with supervision / CGA however required decreased amb speed to perform safely.   Time 2   Period Weeks   Status On-going   PT LONG TERM GOAL #6   Title Pt will score 52% (48% limited) on FOTO to indicate functional improvements (04/29/2015)   Time 2   Period Weeks    Status New           Plan - 04/21/15 [id number]    Clinical Impression Statement Pt. tolerated all hip / knee strengthening well with no pain and minimal fatigue reported.  Pt. continues to have difficulty with forward gaze during ambulation and standing activity.  Pt. able to ascend / descend ramps x 2 with no LOB however decreased ambulation speed with navigation.  Pt. would benefit from continued ambulation training with short distances and ramp / curb navigation.     PT Next Visit Plan SPC ambulation outside with incline/decline, core/hip strengthening.  Continue to assess pt. ability to safely perform HEP 4 -way hip strengthening with red TB.     Consulted and Agree with Plan of Care Patient       Problem List Patient Active Problem List   Diagnosis Date Noted  . Screening, ischemic heart disease 12/08/2014  . Hearing loss 12/08/2014  . Hyperlipidemia 06/04/2014  . Medicare annual wellness visit, subsequent 11/30/2013  . COPD with chronic bronchitis ([place]) 11/02/2013  . Osteopenia 11/02/2013  . Gait instability 11/02/2013    Bess Harvest, PTA 04/21/2015, 10:19 AM  [facility] [address]  [place], [place], [address] Phone: [phone]   Fax:  [phone]  Name: Asharia Boling MRN: [id number] Date of Birth: 05/16/1932

## 2015-04-26 ENCOUNTER — Ambulatory Visit: Payer: Medicare Other

## 2015-04-26 ENCOUNTER — Telehealth: Payer: Self-pay | Admitting: Physician Assistant

## 2015-04-26 NOTE — Telephone Encounter (Signed)
Caller name: Self   Can be reached: [phone] Pharmacy:  [id number] - [place] - [address] AT [place] [phone] (Phone) [phone] (Fax)        Reason for call: Refill on meloxicam (MOBIC) 15 MG tablet [id number]

## 2015-04-26 NOTE — Telephone Encounter (Signed)
I have never filled per EMR. Ask what she is taking for and how often -- this should be a short-term or intermittent medication.

## 2015-04-27 ENCOUNTER — Ambulatory Visit: Payer: Medicare Other

## 2015-04-27 MED ORDER — MELOXICAM 15 MG PO TABS: 15.0000 mg | ORAL_TABLET | Freq: Every day | ORAL | Status: DC | PRN

## 2015-04-27 NOTE — Telephone Encounter (Signed)
Called and spoke with the pt and informed her of the note below.  Pt verbalized understanding.  She stated that she takes the Mobic for degenerative arthritis and pain.  She takes it 1 tablet per week.   She stated that she fall on Friday and hurt her tailbone, and she has taken the Mobic Sat and Sun.   Then she took Tylenol on Mon.  Informed the pt that she may need to come in to be checked and she stated that it hurts to sit for a long period of time and she feels she will be waiting during the appt..  Please advise.//AB/CMA

## 2015-04-27 NOTE — Telephone Encounter (Signed)
Ok to refill. If symptoms are not improving, she will need to be seen.

## 2015-04-27 NOTE — Telephone Encounter (Signed)
Called and spoke with the pt and informed her of the note below.  Pt verbalized understanding and agreed.  Rx sent to the pharmacy by e-script.//AB/CMA

## 2015-05-02 ENCOUNTER — Ambulatory Visit (INDEPENDENT_AMBULATORY_CARE_PROVIDER_SITE_OTHER): Payer: Medicare Other | Admitting: Physician Assistant

## 2015-05-02 ENCOUNTER — Encounter: Payer: Self-pay | Admitting: Physician Assistant

## 2015-05-02 VITALS — BP 125/61 | HR 77 | Temp 98.0°F

## 2015-05-02 DIAGNOSIS — S300XXA Contusion of lower back and pelvis, initial encounter: Secondary | ICD-10-CM

## 2015-05-02 HISTORY — DX: Contusion of lower back and pelvis, initial encounter: S30.0XXA

## 2015-05-02 NOTE — Progress Notes (Signed)
Pre visit review using our clinic review tool, if applicable. No additional management support is needed unless otherwise documented below in the visit note. 

## 2015-05-02 NOTE — Patient Instructions (Signed)
Please use Tylenol for mild-moderate pain (2 tablets every 6 hours max).  Please continue avoiding prolonged sitting to help with pain. Apply topical Aspercreme or Salon pas to the area. Continue Mobic once weekly.  I do not feel imaging is warranted because your symptoms have improved quickly and imaging will not change treatment.

## 2015-05-02 NOTE — Assessment & Plan Note (Signed)
Improving rapidly. Only pain with prolonged sitting persisting. Tylenol as directed. Mobic once-twice weekly as directed. Supportive measures reviewed. Continue PT twice weekly.

## 2015-05-02 NOTE — Progress Notes (Signed)
Patient presents to clinic today for follow-up of fall sustained 10-days ago. Patient has history of gait instability, is in physical therapy currently to help with gait. Has been doing very well. Has also been doing water aerobcis. Endorses on the 17th, she lost balance while walking and fell onto her "butt". Was able to get up on her on, but was unable to place pressure on tailbone. Patient states she did not see anyone regarding the fall and pain until now. Pain is now only present with prolonged sitting. No pain with ambulation. Has continued physical therapy.  Past Medical History  Diagnosis Date  . COPD (chronic obstructive pulmonary disease) (HCC)     Mild  . Arthritis, rheumatoid ([place])     Spine  . Spinal stenosis   . Scoliosis   . DJD (degenerative joint disease) of knee     Current Outpatient Prescriptions on File Prior to Visit  Medication Sig Dispense Refill  . aspirin 81 MG tablet Take 81 mg by mouth daily.    . Calcium Carbonate-Vitamin D (CALCIUM 600+D) 600-400 MG-UNIT per tablet Take 2 tablets by mouth daily.    . fluticasone (FLONASE) 50 MCG/ACT nasal spray Place 2 sprays into both nostrils daily. 16 g 5  . Fluticasone-Salmeterol (ADVAIR) 250-50 MCG/DOSE AEPB Inhale 1 puff into the lungs 2 (two) times daily. 60 each 5  . glucosamine-chondroitin 500-400 MG tablet Take 2 tablets by mouth daily.    Marland Kitchen guaiFENesin (MUCINEX) 600 MG 12 hr tablet Take 600 mg by mouth daily as needed.     . meloxicam (MOBIC) 15 MG tablet Take 1 tablet (15 mg total) by mouth daily as needed for pain. 30 tablet 0  . metroNIDAZOLE (METROCREAM) 0.75 % cream Apply 1 application topically 2 (two) times daily.    . Multiple Vitamin (MULTIVITAMIN) tablet Take 1 tablet by mouth daily.     No current facility-administered medications on file prior to visit.    Allergies  Allergen Reactions  . Amoxicillin Nausea And Vomiting  . Ibuprofen Swelling    Mouth Swelling     Family History  Problem  Relation Age of Onset  . Heart disease Mother 33    Deceased  . Diabetes Mother   . Heart disease Father 32    Deceased  . Asthma Father   . Heart disease Maternal Uncle   . Colon cancer Brother   . Diabetes Brother     #2  . Heart disease Brother     x2  . Healthy Sister   . Healthy Brother     x1  . Hyperlipidemia Son     x2    Social History   Social History  . Marital Status: Married    Spouse Name: N/A  . Number of Children: N/A  . Years of Education: N/A   Social History Main Topics  . Smoking status: Never Smoker   . Smokeless tobacco: None  . Alcohol Use: None  . Drug Use: None  . Sexual Activity: Not Asked   Other Topics Concern  . None   Social History Narrative    Review of Systems - See HPI.  All other ROS are negative.  BP 125/61 mmHg  Pulse 77  Temp(Src) 98 F (36.7 C) (Oral)  SpO2 99%  Physical Exam  Constitutional: She is oriented to person, place, and time and well-developed, well-nourished, and in no distress.  HENT:  Head: Normocephalic and atraumatic.  Cardiovascular: Normal rate, regular rhythm, normal heart sounds  and intact distal pulses.   Pulmonary/Chest: Effort normal and breath sounds normal. No respiratory distress. She has no wheezes. She has no rales. She exhibits no tenderness.  Musculoskeletal:       Lumbar back: She exhibits no tenderness.       Back:  Neurological: She is alert and oriented to person, place, and time.  Skin: Skin is warm and dry. No rash noted.  Psychiatric: Affect normal.  Vitals reviewed.   No results found for this or any previous visit (from the past 2160 hour(s)).  Assessment/Plan: Coccygeal contusion Improving rapidly. Only pain with prolonged sitting persisting. Tylenol as directed. Mobic once-twice weekly as directed. Supportive measures reviewed. Continue PT twice weekly.

## 2015-05-03 ENCOUNTER — Ambulatory Visit: Payer: Medicare Other

## 2015-05-05 ENCOUNTER — Ambulatory Visit: Payer: Medicare Other

## 2015-05-10 ENCOUNTER — Ambulatory Visit: Payer: Medicare Other | Attending: Physician Assistant | Admitting: Physical Therapy

## 2015-05-10 DIAGNOSIS — Z9181 History of falling: Secondary | ICD-10-CM | POA: Diagnosis not present

## 2015-05-10 DIAGNOSIS — R269 Unspecified abnormalities of gait and mobility: Secondary | ICD-10-CM | POA: Diagnosis not present

## 2015-05-10 DIAGNOSIS — R531 Weakness: Secondary | ICD-10-CM

## 2015-05-10 NOTE — Therapy (Signed)
[facility] [address]  [place], [place], [address] Phone: [phone]   Fax:  [phone]  Physical Therapy Treatment  Patient Details  Name: Tanya Harris MRN: [id number] Date of Birth: 06-24-1932 Referring Provider: Raiford Noble, PA-C  Encounter Date: 05/10/2015      PT End of Session - 05/10/15 1411    Visit Number 15   Number of Visits 16   Date for PT Re-Evaluation 05/12/15   PT Start Time 1402   PT Stop Time 1448   PT Time Calculation (min) 46 min   Activity Tolerance Patient tolerated treatment well   Behavior During Therapy [facility] for tasks assessed/performed      Past Medical History  Diagnosis Date  . COPD (chronic obstructive pulmonary disease) (HCC)     Mild  . Arthritis, rheumatoid ([place])     Spine  . Spinal stenosis   . Scoliosis   . DJD (degenerative joint disease) of knee     Past Surgical History  Procedure Laterality Date  . Total knee arthroplasty  2001    Left  . Appendectomy  2005  . Cataract extraction      Bilateral  . Nasal sinus surgery    . Tonsillectomy      There were no vitals filed for this visit.  Visit Diagnosis:  Abnormality of gait  Weakness  Risk for falls      Subjective Assessment - 05/10/15 1408    Subjective Pt had cancelled last few appts due suffering a fall at home which bruised her tailbone. MD aware but no x-rays taken.   Currently in Pain? No/denies         Today's Treatment  TherEx Treadmill - 1.5 mph x6'  Pool HEP verbal review:  Side stepping  Tandem gait   Heel walking  Toe walking  SLS  3 way SLR  Home HEP review:    Standing B Hip extension + Hamstring curl 2# 10x3"   Standing B Hip ABD 2# 20x3"   Seated LAQ 2# 20x3"   Seated resisted DF with knee extended with red TB 20x3"   Seated B Hip Abd clam with red TB 20x3"          PT Short Term Goals - 03/17/15 1525    PT SHORT TERM GOAL #1   Title independent with  HEP (03/15/15)   Status Achieved           PT Long Term Goals - 05/10/15 1414    PT LONG TERM GOAL #4   Title Pt will be independent with advanced HEP in order to maintain strength and balance gains made at PT (04/29/2015)   Status On-going   PT LONG TERM GOAL #5   Title Pt will be able to navigate paved sidewalks, inculding an incline and a decline with a SPC and supervision for improved community ambulation (04/29/2015)    Status On-going  Pt declined attempt due to c/o pain in tailbone and feeling of unsteadiness   PT LONG TERM GOAL #6   Title Pt will score 52% (48% limited) on FOTO to indicate functional improvements (04/29/2015)   Status On-going               Plan - 05/10/15 1446    Clinical Impression Statement Pt returning after 2 week absence due to limited mobility tolerance secondary to pain in "tailbone" after fall at home. Patient is currently scheduled to complete therapy at next visit  and would still like to plan to transition to HEP at that time, despite recent fall at home. Given patient's desire to proceed with transition to HEP, focused today's visit on review of HEP as weel as exercises/activities to be completed in the pool with pt demonstrating good recall of exercises. Progressed resistance and tolerated and educated pt on continued progression at home. Goal assessment limited today due to pt's reluctance to attempt gait outdoors with [facility] secondary to residual tailbone pain from fall. Will plan for further review of balance portion of OTAGO at next visit, attempt outdoor ambulation with [facility] if pt willing, and address any other concerns pt may have regarding transition to home program. Discussed option to place pt on hold for 30 days after final visit in the event that issues arise with home program, with pt in agreement with this plan.   PT Next Visit Plan Attempt SPC ambulation outside with incline/decline if pt feels able; Review balance portion of OTAGO HEP and  address any other concerns pt may have re: HEP   Consulted and Agree with Plan of Care Patient        Problem List Patient Active Problem List   Diagnosis Date Noted  . Coccygeal contusion 05/02/2015  . Screening, ischemic heart disease 12/08/2014  . Hearing loss 12/08/2014  . Hyperlipidemia 06/04/2014  . Medicare annual wellness visit, subsequent 11/30/2013  . COPD with chronic bronchitis ([place]) 11/02/2013  . Osteopenia 11/02/2013  . Gait instability 11/02/2013    Percival Spanish, PT, MPT 05/10/2015, 4:44 PM  [facility] [address]  [place], [place], [address] Phone: [phone]   Fax:  [phone]  Name: Tanya Harris MRN: [id number] Date of Birth: 08/31/1932

## 2015-05-12 ENCOUNTER — Ambulatory Visit: Payer: Medicare Other

## 2015-05-12 DIAGNOSIS — R531 Weakness: Secondary | ICD-10-CM

## 2015-05-12 DIAGNOSIS — Z9181 History of falling: Secondary | ICD-10-CM

## 2015-05-12 DIAGNOSIS — R269 Unspecified abnormalities of gait and mobility: Secondary | ICD-10-CM

## 2015-05-12 NOTE — Therapy (Addendum)
[facility] [address]  [place], [place], [address] Phone: [phone]   Fax:  [phone]  Physical Therapy Treatment  Patient Details  Name: Tanya Harris MRN: [id number] Date of Birth: 07/10/1932 Referring Provider: Raiford Noble, PA-C  Encounter Date: 05/12/2015      PT End of Session - 05/12/15 1449    Visit Number 16   Number of Visits 16   Date for PT Re-Evaluation 05/12/15   PT Start Time 8270   PT Stop Time 1449   PT Time Calculation (min) 41 min   Activity Tolerance Patient tolerated treatment well   Behavior During Therapy [facility] for tasks assessed/performed      Past Medical History  Diagnosis Date  . COPD (chronic obstructive pulmonary disease) (HCC)     Mild  . Arthritis, rheumatoid ([place])     Spine  . Spinal stenosis   . Scoliosis   . DJD (degenerative joint disease) of knee     Past Surgical History  Procedure Laterality Date  . Total knee arthroplasty  2001    Left  . Appendectomy  2005  . Cataract extraction      Bilateral  . Nasal sinus surgery    . Tonsillectomy      There were no vitals filed for this visit.  Visit Diagnosis:  Weakness  Abnormality of gait  Risk for falls      Subjective Assessment - 05/12/15 1640    Subjective Pt. reports soreness from fall is a little better however rates buttockes pain as a 4/10 currently.     Patient Stated Goals improve mobility; walk upright with RW, improve strength and balance   Currently in Pain? Yes   Pain Score 4    Pain Location Buttocks   Pain Orientation Left;Right;Posterior   Pain Descriptors / Indicators Dull;Aching   Pain Type Acute pain   Pain Radiating Towards n/a   Pain Onset In the past 7 days   Aggravating Factors  sitting    Pain Relieving Factors standing   Multiple Pain Sites No           [facility] Adult PT Treatment/Exercise - 05/12/15 1650    Ambulation/Gait   Ambulation Distance (Feet) 200 Feet   Assistive device Straight cane   Gait Pattern Step-through pattern;Trendelenburg   Ambulation Surface Level;Indoor;Outdoor;Paved   Ramp 5: Supervision   Ramp Details (indicate cue type and reason) Pt. able to ascend / descend ramp outdoors x 2 with supervision and single point cane however required decreased gait speed to navigate safely; pt. able to ascend / descend curb with [facility] and supervision from therapist without LOB.     Curb 5: Supervision          PT Short Term Goals - 03/17/15 1525    PT SHORT TERM GOAL #1   Title independent with HEP (03/15/15)   Status Achieved           PT Long Term Goals - 05/12/15 1816    PT LONG TERM GOAL #1   Title improve BERG balance score to >/= 45/56 for improved balance and decreased fall risk. (04/14/15)   Status Achieved   PT LONG TERM GOAL #2   Title ambulate > 350' with RW modified independent for improved functional mobility (04/14/15)   Status Achieved   PT LONG TERM GOAL #3   Title perform sit to stand with 1 UE support x 10 reps to demonstrate improved lower extremity strength (  04/14/15)   Status Achieved   PT LONG TERM GOAL #4   Title Pt will be independent with advanced HEP in order to maintain strength and balance gains made at PT (04/29/2015)  03-Jun-2015: Pt. verbalized independence with advanced HEP.     Status Achieved   PT LONG TERM GOAL #5   Title Pt will be able to navigate paved sidewalks, inculding an incline and a decline with a SPC and supervision for improved community ambulation (04/29/2015)   2015/06/03: Pt. able to navigate sadewalks, inclines / declines with SPC and supervision.     Status Achieved  Pt declined attempt due to c/o pain in tailbone and feeling of unsteadiness   PT LONG TERM GOAL #6   Title Pt will score 52% (48% limited) on FOTO to indicate functional improvements (04/29/2015)  06-03-15: Pt. left without taking FOTO; unable to assess.     Status Unable to assess          Plan - 06-03-15 1449     Clinical Impression Statement Today's treatment focused on reviewing OTAGO HEP balance activities, gait training with incline / declines outdoors, and addressing pt. concerns with home program.  Pt. transitioning to home exercise program with 30 day hold in case issues arise with home program.  Pt. able to navigate incline / declines + ascend / descend curb with SPC and SBA from therapist.     PT Next Visit Plan 30 day hold on PT.  Pt. to transition to home exercise program, returning to PT in case of issues.  Can assess need for d/c on pt. return.   Consulted and Agree with Plan of Care --      Problem List Patient Active Problem List   Diagnosis Date Noted  . Coccygeal contusion 05/02/2015  . Screening, ischemic heart disease 12/08/2014  . Hearing loss 12/08/2014  . Hyperlipidemia 06/04/2014  . Medicare annual wellness visit, subsequent 11/30/2013  . COPD with chronic bronchitis ([place]) 11/02/2013  . Osteopenia 11/02/2013  . Gait instability 11/02/2013    Bess Harvest, PTA 2015/06/03, 6:21 PM  [facility] [address]  [place], [place], [address] Phone: [phone]   Fax:  [phone]  Name: Tanya Harris MRN: [id number] Date of Birth: 11-18-1932   PHYSICAL THERAPY DISCHARGE SUMMARY  Visits from Start of Care: 16  Current functional level related to goals / functional outcomes:   Pt demonstrated good progress with PT with improved balance and activity tolerance noted. Berg Balance test improved from /56 to 47/56 and pt able to safely ambulate on all surfaces with rollator. Approximately 2 weeks prior to last visit, pt experienced a fall in her home resulting in a "bruised tailbone" but pt still wanting to proceed with D/C and transition to HEP as planned, therefore reviewed of HEP, exercises/activities to be completed in the pool and OTAGO program, with pt demonstrating good recall of exercises. All goals met as of last  visit other than FOTO not assessed as pt left without completing survey. Pt was placed on hold for 30 days after final visit, but has not needed to return, therefore will proceed with D/C from PT for this episode.   Remaining deficits:   None   Education / Equipment:   HEP, OTAGO balance program  G-Codes - 06-03-15    Functional Assessment Tool Used BERG 47/56(16% limitation)   Functional Limitation Mobility: Walking and moving around   Mobility: Walking and Moving Around Goal Status [phone])  At least 1 percent but less than 20 percent impaired, limited or restricted   Mobility: Walking and Moving Around Discharge Status [phone]) At least 1 percent but less than 20 percent impaired, limited or restricted            Plan: Patient agrees to discharge.  Patient goals were met. Patient is being discharged due to being pleased with the current functional level.  ?????       Percival Spanish, PT, MPT 06/23/2015, 8:44 AM  [facility] [place], [place], [address] Phone: [phone]   Fax:  [phone]

## 2015-07-12 ENCOUNTER — Ambulatory Visit (INDEPENDENT_AMBULATORY_CARE_PROVIDER_SITE_OTHER): Payer: Medicare Other | Admitting: Physician Assistant

## 2015-07-12 ENCOUNTER — Encounter: Payer: Self-pay | Admitting: Physician Assistant

## 2015-07-12 VITALS — BP 108/68 | HR 71 | Temp 97.9°F | Resp 16 | Ht 62.0 in | Wt 161.4 lb

## 2015-07-12 DIAGNOSIS — E785 Hyperlipidemia, unspecified: Secondary | ICD-10-CM

## 2015-07-12 DIAGNOSIS — G4700 Insomnia, unspecified: Secondary | ICD-10-CM

## 2015-07-12 MED ORDER — TRAZODONE HCL 50 MG PO TABS: 25.0000 mg | ORAL_TABLET | Freq: Every evening | ORAL | Status: DC | PRN

## 2015-07-12 MED ORDER — FLUTICASONE-SALMETEROL 250-50 MCG/DOSE IN AEPB
1.0000 | INHALATION_SPRAY | Freq: Two times a day (BID) | RESPIRATORY_TRACT | Status: DC
Start: 2015-07-12 — End: 2016-11-14

## 2015-07-12 MED ORDER — FLUTICASONE PROPIONATE 50 MCG/ACT NA SUSP: 2.0000 | Freq: Every day | NASAL | Status: DC

## 2015-07-12 NOTE — Progress Notes (Signed)
Pre visit review using our clinic review tool, if applicable. No additional management support is needed unless otherwise documented below in the visit note/SLS  

## 2015-07-12 NOTE — Progress Notes (Signed)
Patient presents to clinic today for follow-up of hyperlipidemia. Last lipid panel revealed total cholesterol at 235 and LDL at 158, increased by 30 points from year prior. Patient was encouraged to take fish oil and begin TLC. Endorses taking fish oil as directed. Endorses well-balanced diet low in fats and cholesterol. Limiting fried foods and is limiting sugars. Is staying well-hydrated.  Patient endorses continued difficulty with sleep. Is averaging only a few hours of sleep per night with daytime somnolence. She is worried about lack of sleep due to her history of gait instability.   Past Medical History  Diagnosis Date  . COPD (chronic obstructive pulmonary disease) (HCC)     Mild  . Arthritis, rheumatoid ([place])     Spine  . Spinal stenosis   . Scoliosis   . DJD (degenerative joint disease) of knee     Current Outpatient Prescriptions on File Prior to Visit  Medication Sig Dispense Refill  . aspirin 81 MG tablet Take 81 mg by mouth daily.    . Calcium Carbonate-Vitamin D (CALCIUM 600+D) 600-400 MG-UNIT per tablet Take 2 tablets by mouth daily.    . fluticasone (FLONASE) 50 MCG/ACT nasal spray Place 2 sprays into both nostrils daily. 16 g 5  . Fluticasone-Salmeterol (ADVAIR) 250-50 MCG/DOSE AEPB Inhale 1 puff into the lungs 2 (two) times daily. 60 each 5  . glucosamine-chondroitin 500-400 MG tablet Take 2 tablets by mouth daily.    Marland Kitchen guaiFENesin (MUCINEX) 600 MG 12 hr tablet Take 600 mg by mouth daily as needed.     . meloxicam (MOBIC) 15 MG tablet Take 1 tablet (15 mg total) by mouth daily as needed for pain. 30 tablet 0  . metroNIDAZOLE (METROCREAM) 0.75 % cream Apply 1 application topically 2 (two) times daily as needed.     . Multiple Vitamin (MULTIVITAMIN) tablet Take 1 tablet by mouth daily.     No current facility-administered medications on file prior to visit.    Allergies  Allergen Reactions  . Amoxicillin Nausea And Vomiting  . Ibuprofen Swelling    Mouth  Swelling     Family History  Problem Relation Age of Onset  . Heart disease Mother 22    Deceased  . Diabetes Mother   . Heart disease Father 22    Deceased  . Asthma Father   . Heart disease Maternal Uncle   . Colon cancer Brother   . Diabetes Brother     #2  . Heart disease Brother     x2  . Healthy Sister   . Healthy Brother     x1  . Hyperlipidemia Son     x2    Social History   Social History  . Marital Status: Married    Spouse Name: N/A  . Number of Children: N/A  . Years of Education: N/A   Social History Main Topics  . Smoking status: Never Smoker   . Smokeless tobacco: None  . Alcohol Use: None  . Drug Use: None  . Sexual Activity: Not Asked   Other Topics Concern  . None   Social History Narrative   Review of Systems - See HPI.  All other ROS are negative.  BP 108/68 mmHg  Pulse 71  Temp(Src) 97.9 F (36.6 C) (Oral)  Resp 16  Ht 5\' 2"  (1.575 m)  Wt 161 lb 6 oz (73.199 kg)  BMI 29.51 kg/m2  SpO2 97%  Physical Exam  Constitutional: She is oriented to person, place, and time and  well-developed, well-nourished, and in no distress.  HENT:  Head: Normocephalic and atraumatic.  Eyes: Conjunctivae are normal.  Cardiovascular: Normal rate, regular rhythm, normal heart sounds and intact distal pulses.   Pulmonary/Chest: Effort normal.  Neurological: She is alert and oriented to person, place, and time.  Skin: Skin is warm and dry. No rash noted.  Psychiatric: Affect normal.  Vitals reviewed.  Assessment/Plan: 1. Hyperlipidemia Will continue TLC and fish oils. Will recheck cholesterol. - Lipid panel; Future  2. Insomnia Will attempt trial of low dose of Trazodone. Feel this is a much better option than Ambien, Belsomra, Klonopin giving age.  FU 1 month.

## 2015-07-12 NOTE — Patient Instructions (Signed)
Please schedule a lab appointment so we can recheck your cholesterol. Continue diet, exercise and fish oils.  Start the Trazodone at night to help with sleep. Do not take more than 1/2 tablet.  Follow-up with me in 1 month.  Consider starting a Tumeric supplement to help with inflammation in joints.

## 2015-07-13 ENCOUNTER — Other Ambulatory Visit (INDEPENDENT_AMBULATORY_CARE_PROVIDER_SITE_OTHER): Payer: Medicare Other

## 2015-07-13 DIAGNOSIS — E785 Hyperlipidemia, unspecified: Secondary | ICD-10-CM

## 2015-07-13 LAB — LIPID PANEL
CHOL/HDL RATIO: 4
Cholesterol: 221 mg/dL — ABNORMAL HIGH (ref 0–200)
HDL: 55.2 mg/dL (ref >39.00)
LDL CALC: 143 mg/dL — AB (ref 0–99)
NONHDL: 165.57
Triglycerides: 111 mg/dL (ref 0.0–149.0)
VLDL: 22.2 mg/dL (ref 0.0–40.0)

## 2015-12-12 ENCOUNTER — Encounter: Payer: Self-pay | Admitting: Physician Assistant

## 2015-12-12 ENCOUNTER — Ambulatory Visit (INDEPENDENT_AMBULATORY_CARE_PROVIDER_SITE_OTHER): Payer: Medicare Other | Admitting: Physician Assistant

## 2015-12-12 VITALS — BP 106/68 | HR 63 | Temp 98.4°F | Resp 16 | Ht 62.0 in | Wt 160.4 lb

## 2015-12-12 DIAGNOSIS — Z23 Encounter for immunization: Secondary | ICD-10-CM

## 2015-12-12 DIAGNOSIS — N649 Disorder of breast, unspecified: Secondary | ICD-10-CM

## 2015-12-12 DIAGNOSIS — Z8669 Personal history of other diseases of the nervous system and sense organs: Secondary | ICD-10-CM | POA: Diagnosis not present

## 2015-12-12 DIAGNOSIS — Z0000 Encounter for general adult medical examination without abnormal findings: Secondary | ICD-10-CM

## 2015-12-12 DIAGNOSIS — L988 Other specified disorders of the skin and subcutaneous tissue: Secondary | ICD-10-CM | POA: Insufficient documentation

## 2015-12-12 DIAGNOSIS — E785 Hyperlipidemia, unspecified: Secondary | ICD-10-CM

## 2015-12-12 DIAGNOSIS — R8299 Other abnormal findings in urine: Secondary | ICD-10-CM | POA: Diagnosis not present

## 2015-12-12 DIAGNOSIS — R82998 Other abnormal findings in urine: Secondary | ICD-10-CM

## 2015-12-12 LAB — URINALYSIS, ROUTINE W REFLEX MICROSCOPIC
Bilirubin Urine: NEGATIVE
Hgb urine dipstick: NEGATIVE
Nitrite: NEGATIVE
SPECIFIC GRAVITY, URINE: 1.01 (ref 1.000–1.030)
TOTAL PROTEIN, URINE-UPE24: NEGATIVE
URINE GLUCOSE: NEGATIVE
UROBILINOGEN UA: 0.2 (ref 0.0–1.0)
pH: 7 (ref 5.0–8.0)

## 2015-12-12 LAB — COMPREHENSIVE METABOLIC PANEL
ALT: 13 U/L (ref 0–35)
AST: 22 U/L (ref 0–37)
Albumin: 3.6 g/dL (ref 3.5–5.2)
Alkaline Phosphatase: 73 U/L (ref 39–117)
BUN: 18 mg/dL (ref 6–23)
CHLORIDE: 101 meq/L (ref 96–112)
CO2: 30 meq/L (ref 19–32)
CREATININE: 0.54 mg/dL (ref 0.40–1.20)
Calcium: 9.5 mg/dL (ref 8.4–10.5)
GFR: 115.1 mL/min (ref >60.00)
GLUCOSE: 92 mg/dL (ref 70–99)
Potassium: 3.8 mEq/L (ref 3.5–5.1)
SODIUM: 136 meq/L (ref 135–145)
Total Bilirubin: 0.5 mg/dL (ref 0.2–1.2)
Total Protein: 7.2 g/dL (ref 6.0–8.3)

## 2015-12-12 LAB — LIPID PANEL
CHOL/HDL RATIO: 4
Cholesterol: 204 mg/dL — ABNORMAL HIGH (ref 0–200)
HDL: 47.1 mg/dL (ref >39.00)
LDL CALC: 135 mg/dL — AB (ref 0–99)
NonHDL: 156.76
Triglycerides: 107 mg/dL (ref 0.0–149.0)
VLDL: 21.4 mg/dL (ref 0.0–40.0)

## 2015-12-12 MED ORDER — FLUTICASONE PROPIONATE 50 MCG/ACT NA SUSP: 2.0000 | Freq: Every day | NASAL | 5 refills | Status: DC

## 2015-12-12 MED ORDER — MELOXICAM 15 MG PO TABS: 15.0000 mg | ORAL_TABLET | Freq: Every day | ORAL | 0 refills | Status: DC | PRN

## 2015-12-12 NOTE — Progress Notes (Signed)
Pre visit review using our clinic review tool, if applicable. No additional management support is needed unless otherwise documented below in the visit note/SLS  

## 2015-12-12 NOTE — Addendum Note (Signed)
Addended by: Rockwell Germany on: 12/12/2015 05:41 PM   Modules accepted: Orders

## 2015-12-12 NOTE — Addendum Note (Signed)
Addended by: Caffie Pinto on: 12/12/2015 03:01 PM   Modules accepted: Orders

## 2015-12-12 NOTE — Progress Notes (Signed)
Subjective:    Tanya Harris is a 82 y.o. female who presents for Medicare Annual/Subsequent preventive examination.  Preventive Screening-Counseling & Management  Tobacco History  Smoking Status  . Never Smoker  Smokeless Tobacco  . Not on file     Problems Prior to Visit 1. Hyperlipidemia -- Previously controlled with diet and exercise. No history of CAD, MI or CVA. Is currently on fish oil and 81 mg ASA daily. Endorses well-balanced diet. Avoiding sweets. Mainly water to drink. Patient is exercising every day -- 20 minutes walking and 30 minutes aerobic exercise.   2. Patient endorses skin lesion of left breast first noted this week. Denies pain or itch. Denies change in size or appearance since first noted. Is followed by Dermatology. Would like assessed today.  3. Patient with history of cataract. Is requesting referral to a new ophthalmologist here in the [place] area.  Current Problems (verified) Patient Active Problem List   Diagnosis Date Noted  . Coccygeal contusion 05/02/2015  . Screening, ischemic heart disease 12/08/2014  . Hearing loss 12/08/2014  . Hyperlipidemia 06/04/2014  . Medicare annual wellness visit, subsequent 11/30/2013  . COPD with chronic bronchitis ([place]) 11/02/2013  . Osteopenia 11/02/2013  . Gait instability 11/02/2013    Medications Prior to Visit Current Outpatient Prescriptions on File Prior to Visit  Medication Sig Dispense Refill  . aspirin 81 MG tablet Take 81 mg by mouth daily.    . Calcium Carbonate-Vitamin D (CALCIUM 600+D) 600-400 MG-UNIT per tablet Take 2 tablets by mouth daily.    . Fluticasone-Salmeterol (ADVAIR) 250-50 MCG/DOSE AEPB Inhale 1 puff into the lungs 2 (two) times daily. 60 each 5  . glucosamine-chondroitin 500-400 MG tablet Take 2 tablets by mouth daily.    Marland Kitchen guaiFENesin (MUCINEX) 600 MG 12 hr tablet Take 600 mg by mouth daily as needed.     . metroNIDAZOLE (METROCREAM) 0.75 % cream Apply 1 application topically 2 (two)  times daily as needed.     . Multiple Vitamin (MULTIVITAMIN) tablet Take 1 tablet by mouth daily.    Marland Kitchen omega-3 fish oil (MAXEPA) 1000 MG CAPS capsule Take 2 capsules by mouth daily. TAKING 1200mg  DAILY     No current facility-administered medications on file prior to visit.     Current Medications (verified) Current Outpatient Prescriptions  Medication Sig Dispense Refill  . aspirin 81 MG tablet Take 81 mg by mouth daily.    . Calcium Carbonate-Vitamin D (CALCIUM 600+D) 600-400 MG-UNIT per tablet Take 2 tablets by mouth daily.    . fluticasone (FLONASE) 50 MCG/ACT nasal spray Place 2 sprays into both nostrils daily. 16 g 5  . Fluticasone-Salmeterol (ADVAIR) 250-50 MCG/DOSE AEPB Inhale 1 puff into the lungs 2 (two) times daily. 60 each 5  . glucosamine-chondroitin 500-400 MG tablet Take 2 tablets by mouth daily.    Marland Kitchen guaiFENesin (MUCINEX) 600 MG 12 hr tablet Take 600 mg by mouth daily as needed.     . meloxicam (MOBIC) 15 MG tablet Take 1 tablet (15 mg total) by mouth daily as needed for pain. 30 tablet 0  . metroNIDAZOLE (METROCREAM) 0.75 % cream Apply 1 application topically 2 (two) times daily as needed.     . Multiple Vitamin (MULTIVITAMIN) tablet Take 1 tablet by mouth daily.    Marland Kitchen omega-3 fish oil (MAXEPA) 1000 MG CAPS capsule Take 2 capsules by mouth daily. TAKING 1200mg  DAILY     No current facility-administered medications for this visit.      Allergies (verified) Amoxicillin  and Ibuprofen   PAST HISTORY  Family History Family History  Problem Relation Age of Onset  . Heart disease Mother 98    Deceased  . Diabetes Mother   . Heart disease Father 62    Deceased  . Asthma Father   . Heart disease Maternal Uncle   . Colon cancer Brother   . Diabetes Brother     #2  . Heart disease Brother     x2  . Healthy Sister   . Healthy Brother     x1  . Hyperlipidemia Son     x2    Social History Social History  Substance Use Topics  . Smoking status: Never Smoker  .  Smokeless tobacco: Not on file  . Alcohol use Not on file    Are there smokers in your home (other than you)? No  Risk Factors Current exercise habits: Gym/ health club routine includes walking on track  and water aerobics.  Dietary issues discussed: Body mass index is 29.33 kg/m. well-balanced diet overall. Good hydration.   Cardiac risk factors: advanced age (older than 78 for men, 30 for women) and dyslipidemia.  Depression Screen (Note: if answer to either of the following is "Yes", a more complete depression screening is indicated)   Over the past two weeks, have you felt down, depressed or hopeless? No  Over the past two weeks, have you felt little interest or pleasure in doing things? No  Have you lost interest or pleasure in daily life? No  Do you often feel hopeless? No  Do you cry easily over simple problems? No  Activities of Daily Living In your present state of health, do you have any difficulty performing the following activities?:  Driving? Yes - husband drives Managing money?  No Feeding yourself? No  Climbing a flight of stairs? Cannot do due to chronic gait abnormalities. Uses walker. Preparing food and eating?: No Bathing or showering? No Getting dressed: No Getting to the toilet? No Using the toilet:No Moving around from place to place: No In the past year have you fallen or had a near fall?:No   Are you sexually active?  No  Do you have more than one partner?  N/A   Hearing Difficulties: No Do you often ask people to speak up or repeat themselves? No Do you experience ringing or noises in your ears? No Do you have difficulty understanding soft or whispered voices? No   Do you feel that you have a problem with memory? No  Do you often misplace items? No  Do you feel safe at home?  Yes  Cognitive Testing  Alert? Yes  Normal Appearance?Yes  Oriented to person? Yes  Place? Yes   Time? Yes  Recall of three objects?  Yes    Advanced Directives have  been discussed with the patient? Yes  List the Names of Other Physician/Practitioners you currently use: See EMR for comprehensive list.  Indicate any recent Medical Services you may have received from other than [facility] providers in the past year (date may be approximate).  Immunization History  Administered Date(s) Administered  . Influenza, High Dose Seasonal PF 11/30/2013  . Pneumococcal Conjugate-13 06/01/2014  . Zoster 03/05/2010    Screening Tests Health Maintenance  Topic Date Due  . DTaP/Tdap/Td (1 - Tdap) 09/27/1953  . TETANUS/TDAP  09[birth year]27/1951  . PNA vac Low Risk Adult (2 of 2 - PPSV23) 06/01/2015  . INFLUENZA VACCINE  10/04/2015  . DEXA SCAN  Completed  .  ZOSTAVAX  Completed    All answers were reviewed with the patient and necessary referrals were made:  Leeanne Rio, PA-C   12/12/2015   History reviewed: allergies, current medications, past family history, past medical history, past social history, past surgical history and problem list  Review of Systems Pertinent items noted in HPI and remainder of comprehensive ROS otherwise negative.    Objective:      Body mass index is 29.33 kg/m. BP 106/68 (BP Location: Left Arm, Patient Position: Sitting, Cuff Size: Large)   Pulse 63   Temp 98.4 F (36.9 C) (Oral)   Resp 16   Ht 5\' 2"  (1.575 m)   Wt 160 lb 6 oz (72.7 kg)   SpO2 100%   BMI 29.33 kg/m   General appearance: alert, cooperative, appears stated age and no distress Head: Normocephalic, without obvious abnormality, atraumatic Ears: normal TM's and external ear canals both ears Nose: Nares normal. Septum midline. Mucosa normal. No drainage or sinus tenderness. Throat: lips, mucosa, and tongue normal; teeth and gums normal Lungs: clear to auscultation bilaterally Heart: regular rate and rhythm, S1, S2 normal, no murmur, click, rub or gallop Abdomen: soft, non-tender; bowel sounds normal; no masses,  no organomegaly Extremities: extremities  normal, atraumatic, no cyanosis or edema Pulses: 2+ and symmetric Skin: Skin color, texture, turgor normal. No rashes noted. Chaperone present for skin examination. On the left breast at the 3'o clock position, there is a raised velvety lesion that is tan in color with some darker speckling. Seems consistent with developing seborrheic keratosis but needs further assessment.     Assessment:     (1) Medicare Wellness, Subsequent (2) Hyperlipidemia (3) Skin Lesion of Breast (4) Hx of Cataracts     Plan:     (1)During the course of the visit the patient was educated and counseled about appropriate screening and preventive services including:    Pneumococcal vaccine   Influenza vaccine  Bone densitometry screening  Diabetes screening  Nutrition counseling   (2) Diet and exercise recommendations reviewed with patient. Will obtain labs today. Continue 81 mg ASA.  (3) Seems consistent with a benign seborrheic keratosis but giving location and dichromia,patient is to schedule a follow-up with Dermatology for further assessment.  (4) Referral to [facility] placed.  Patient Instructions (the written plan) was given to the patient.  Medicare Attestation I have personally reviewed: The patient's medical and social history Their use of alcohol, tobacco or illicit drugs Their current medications and supplements The patient's functional ability including ADLs,fall risks, home safety risks, cognitive, and hearing and visual impairment Diet and physical activities Evidence for depression or mood disorders  The patient's weight, height, BMI, and visual acuity have been recorded in the chart.  I have made referrals, counseling, and provided education to the patient based on review of the above and I have provided the patient with a written personalized care plan for preventive services.     Raiford Noble [place], [place]   12/12/2015

## 2015-12-12 NOTE — Patient Instructions (Signed)
Please go to the lab for blood work. I will call you with your results.  Please continue medications as directed. You will be contacted for an appointment by Ophthalmology. Please schedule an appointment with your Dermatologist to take a second look at the skin lesion on the breast.   Preventive Care for Adults, Female A healthy lifestyle and preventive care can promote health and wellness. Preventive health guidelines for women include the following key practices.  A routine yearly physical is a good way to check with your health care provider about your health and preventive screening. It is a chance to share any concerns and updates on your health and to receive a thorough exam.  Visit your dentist for a routine exam and preventive care every 6 months. Brush your teeth twice a day and floss once a day. Good oral hygiene prevents tooth decay and gum disease.  The frequency of eye exams is based on your age, health, family medical history, use of contact lenses, and other factors. Follow your health care provider's recommendations for frequency of eye exams.  Eat a healthy diet. Foods like vegetables, fruits, whole grains, low-fat dairy products, and lean protein foods contain the nutrients you need without too many calories. Decrease your intake of foods high in solid fats, added sugars, and salt. Eat the right amount of calories for you.Get information about a proper diet from your health care provider, if necessary.  Regular physical exercise is one of the most important things you can do for your health. Most adults should get at least 150 minutes of moderate-intensity exercise (any activity that increases your heart rate and causes you to sweat) each week. In addition, most adults need muscle-strengthening exercises on 2 or more days a week.  Maintain a healthy weight. The body mass index (BMI) is a screening tool to identify possible weight problems. It provides an estimate of body fat  based on height and weight. Your health care provider can find your BMI and can help you achieve or maintain a healthy weight.For adults 20 years and older:  A BMI below 18.5 is considered underweight.  A BMI of 18.5 to 24.9 is normal.  A BMI of 25 to 29.9 is considered overweight.  A BMI of 30 and above is considered obese.  Maintain normal blood lipids and cholesterol levels by exercising and minimizing your intake of saturated fat. Eat a balanced diet with plenty of fruit and vegetables. Blood tests for lipids and cholesterol should begin at age 6 and be repeated every 5 years. If your lipid or cholesterol levels are high, you are over 50, or you are at high risk for heart disease, you may need your cholesterol levels checked more frequently.Ongoing high lipid and cholesterol levels should be treated with medicines if diet and exercise are not working.  If you smoke, find out from your health care provider how to quit. If you do not use tobacco, do not start.  Lung cancer screening is recommended for adults aged 1-80 years who are at high risk for developing lung cancer because of a history of smoking. A yearly low-dose CT scan of the lungs is recommended for people who have at least a 30-pack-year history of smoking and are a current smoker or have quit within the past 15 years. A pack year of smoking is smoking an average of 1 pack of cigarettes a day for 1 year (for example: 1 pack a day for 30 years or 2 packs a day  for 15 years). Yearly screening should continue until the smoker has stopped smoking for at least 15 years. Yearly screening should be stopped for people who develop a health problem that would prevent them from having lung cancer treatment.  If you are pregnant, do not drink alcohol. If you are breastfeeding, be very cautious about drinking alcohol. If you are not pregnant and choose to drink alcohol, do not have more than 1 drink per day. One drink is considered to be 12  ounces (355 mL) of beer, 5 ounces (148 mL) of wine, or 1.5 ounces (44 mL) of liquor.  Avoid use of street drugs. Do not share needles with anyone. Ask for help if you need support or instructions about stopping the use of drugs.  High blood pressure causes heart disease and increases the risk of stroke. Your blood pressure should be checked at least every 1 to 2 years. Ongoing high blood pressure should be treated with medicines if weight loss and exercise do not work.  If you are 41-69 years old, ask your health care provider if you should take aspirin to prevent strokes.  Diabetes screening is done by taking a blood sample to check your blood glucose level after you have not eaten for a certain period of time (fasting). If you are not overweight and you do not have risk factors for diabetes, you should be screened once every 3 years starting at age 78. If you are overweight or obese and you are 72-70 years of age, you should be screened for diabetes every year as part of your cardiovascular risk assessment.  Breast cancer screening is essential preventive care for women. You should practice "breast self-awareness." This means understanding the normal appearance and feel of your breasts and may include breast self-examination. Any changes detected, no matter how small, should be reported to a health care provider. Women in their 65s and 30s should have a clinical breast exam (CBE) by a health care provider as part of a regular health exam every 1 to 3 years. After age 62, women should have a CBE every year. Starting at age 18, women should consider having a mammogram (breast X-ray test) every year. Women who have a family history of breast cancer should talk to their health care provider about genetic screening. Women at a high risk of breast cancer should talk to their health care providers about having an MRI and a mammogram every year.  Breast cancer gene (BRCA)-related cancer risk assessment is  recommended for women who have family members with BRCA-related cancers. BRCA-related cancers include breast, ovarian, tubal, and peritoneal cancers. Having family members with these cancers may be associated with an increased risk for harmful changes (mutations) in the breast cancer genes BRCA1 and BRCA2. Results of the assessment will determine the need for genetic counseling and BRCA1 and BRCA2 testing.  Your health care provider may recommend that you be screened regularly for cancer of the pelvic organs (ovaries, uterus, and vagina). This screening involves a pelvic examination, including checking for microscopic changes to the surface of your cervix (Pap test). You may be encouraged to have this screening done every 3 years, beginning at age 32.  For women ages 42-65, health care providers may recommend pelvic exams and Pap testing every 3 years, or they may recommend the Pap and pelvic exam, combined with testing for human papilloma virus (HPV), every 5 years. Some types of HPV increase your risk of cervical cancer. Testing for HPV may also be  done on women of any age with unclear Pap test results.  Other health care providers may not recommend any screening for nonpregnant women who are considered low risk for pelvic cancer and who do not have symptoms. Ask your health care provider if a screening pelvic exam is right for you.  If you have had past treatment for cervical cancer or a condition that could lead to cancer, you need Pap tests and screening for cancer for at least 20 years after your treatment. If Pap tests have been discontinued, your risk factors (such as having a new sexual partner) need to be reassessed to determine if screening should resume. Some women have medical problems that increase the chance of getting cervical cancer. In these cases, your health care provider may recommend more frequent screening and Pap tests.  Colorectal cancer can be detected and often prevented. Most  routine colorectal cancer screening begins at the age of 16 years and continues through age 12 years. However, your health care provider may recommend screening at an earlier age if you have risk factors for colon cancer. On a yearly basis, your health care provider may provide home test kits to check for hidden blood in the stool. Use of a small camera at the end of a tube, to directly examine the colon (sigmoidoscopy or colonoscopy), can detect the earliest forms of colorectal cancer. Talk to your health care provider about this at age 15, when routine screening begins. Direct exam of the colon should be repeated every 5-10 years through age 39 years, unless early forms of precancerous polyps or small growths are found.  People who are at an increased risk for hepatitis B should be screened for this virus. You are considered at high risk for hepatitis B if:  You were born in a country where hepatitis B occurs often. Talk with your health care provider about which countries are considered high risk.  Your parents were born in a high-risk country and you have not received a shot to protect against hepatitis B (hepatitis B vaccine).  You have HIV or AIDS.  You use needles to inject street drugs.  You live with, or have sex with, someone who has hepatitis B.  You get hemodialysis treatment.  You take certain medicines for conditions like cancer, organ transplantation, and autoimmune conditions.  Hepatitis C blood testing is recommended for all people born from [birth year] through 1965 and any individual with known risks for hepatitis C.  Practice safe sex. Use condoms and avoid high-risk sexual practices to reduce the spread of sexually transmitted infections (STIs). STIs include gonorrhea, chlamydia, syphilis, trichomonas, herpes, HPV, and human immunodeficiency virus (HIV). Herpes, HIV, and HPV are viral illnesses that have no cure. They can result in disability, cancer, and death.  You should be  screened for sexually transmitted illnesses (STIs) including gonorrhea and chlamydia if:  You are sexually active and are younger than 24 years.  You are older than 24 years and your health care provider tells you that you are at risk for this type of infection.  Your sexual activity has changed since you were last screened and you are at an increased risk for chlamydia or gonorrhea. Ask your health care provider if you are at risk.  If you are at risk of being infected with HIV, it is recommended that you take a prescription medicine daily to prevent HIV infection. This is called preexposure prophylaxis (PrEP). You are considered at risk if:  You are sexually active  and do not regularly use condoms or know the HIV status of your partner(s).  You take drugs by injection.  You are sexually active with a partner who has HIV.  Talk with your health care provider about whether you are at high risk of being infected with HIV. If you choose to begin PrEP, you should first be tested for HIV. You should then be tested every 3 months for as long as you are taking PrEP.  Osteoporosis is a disease in which the bones lose minerals and strength with aging. This can result in serious bone fractures or breaks. The risk of osteoporosis can be identified using a bone density scan. Women ages 56 years and over and women at risk for fractures or osteoporosis should discuss screening with their health care providers. Ask your health care provider whether you should take a calcium supplement or vitamin D to reduce the rate of osteoporosis.  Menopause can be associated with physical symptoms and risks. Hormone replacement therapy is available to decrease symptoms and risks. You should talk to your health care provider about whether hormone replacement therapy is right for you.  Use sunscreen. Apply sunscreen liberally and repeatedly throughout the day. You should seek shade when your shadow is shorter than you.  Protect yourself by wearing long sleeves, pants, a wide-brimmed hat, and sunglasses year round, whenever you are outdoors.  Once a month, do a whole body skin exam, using a mirror to look at the skin on your back. Tell your health care provider of new moles, moles that have irregular borders, moles that are larger than a pencil eraser, or moles that have changed in shape or color.  Stay current with required vaccines (immunizations).  Influenza vaccine. All adults should be immunized every year.  Tetanus, diphtheria, and acellular pertussis (Td, Tdap) vaccine. Pregnant women should receive 1 dose of Tdap vaccine during each pregnancy. The dose should be obtained regardless of the length of time since the last dose. Immunization is preferred during the 27th-36th week of gestation. An adult who has not previously received Tdap or who does not know her vaccine status should receive 1 dose of Tdap. This initial dose should be followed by tetanus and diphtheria toxoids (Td) booster doses every 10 years. Adults with an unknown or incomplete history of completing a 3-dose immunization series with Td-containing vaccines should begin or complete a primary immunization series including a Tdap dose. Adults should receive a Td booster every 10 years.  Varicella vaccine. An adult without evidence of immunity to varicella should receive 2 doses or a second dose if she has previously received 1 dose. Pregnant females who do not have evidence of immunity should receive the first dose after pregnancy. This first dose should be obtained before leaving the health care facility. The second dose should be obtained 4-8 weeks after the first dose.  Human papillomavirus (HPV) vaccine. Females aged 13-26 years who have not received the vaccine previously should obtain the 3-dose series. The vaccine is not recommended for use in pregnant females. However, pregnancy testing is not needed before receiving a dose. If a female is  found to be pregnant after receiving a dose, no treatment is needed. In that case, the remaining doses should be delayed until after the pregnancy. Immunization is recommended for any person with an immunocompromised condition through the age of 20 years if she did not get any or all doses earlier. During the 3-dose series, the second dose should be obtained 4-8 weeks  after the first dose. The third dose should be obtained 24 weeks after the first dose and 16 weeks after the second dose.  Zoster vaccine. One dose is recommended for adults aged 100 years or older unless certain conditions are present.  Measles, mumps, and rubella (MMR) vaccine. Adults born before [birth year] generally are considered immune to measles and mumps. Adults born in [birth year] or later should have 1 or more doses of MMR vaccine unless there is a contraindication to the vaccine or there is laboratory evidence of immunity to each of the three diseases. A routine second dose of MMR vaccine should be obtained at least 28 days after the first dose for students attending postsecondary schools, health care workers, or international travelers. People who received inactivated measles vaccine or an unknown type of measles vaccine during 1963-1967 should receive 2 doses of MMR vaccine. People who received inactivated mumps vaccine or an unknown type of mumps vaccine before 1979 and are at high risk for mumps infection should consider immunization with 2 doses of MMR vaccine. For females of childbearing age, rubella immunity should be determined. If there is no evidence of immunity, females who are not pregnant should be vaccinated. If there is no evidence of immunity, females who are pregnant should delay immunization until after pregnancy. Unvaccinated health care workers born before [birth year] who lack laboratory evidence of measles, mumps, or rubella immunity or laboratory confirmation of disease should consider measles and mumps immunization with 2 doses of MMR  vaccine or rubella immunization with 1 dose of MMR vaccine.  Pneumococcal 13-valent conjugate (PCV13) vaccine. When indicated, a person who is uncertain of his immunization history and has no record of immunization should receive the PCV13 vaccine. All adults 47 years of age and older should receive this vaccine. An adult aged 4 years or older who has certain medical conditions and has not been previously immunized should receive 1 dose of PCV13 vaccine. This PCV13 should be followed with a dose of pneumococcal polysaccharide (PPSV23) vaccine. Adults who are at high risk for pneumococcal disease should obtain the PPSV23 vaccine at least 8 weeks after the dose of PCV13 vaccine. Adults older than 80 years of age who have normal immune system function should obtain the PPSV23 vaccine dose at least 1 year after the dose of PCV13 vaccine.  Pneumococcal polysaccharide (PPSV23) vaccine. When PCV13 is also indicated, PCV13 should be obtained first. All adults aged 22 years and older should be immunized. An adult younger than age 74 years who has certain medical conditions should be immunized. Any person who resides in a nursing home or long-term care facility should be immunized. An adult smoker should be immunized. People with an immunocompromised condition and certain other conditions should receive both PCV13 and PPSV23 vaccines. People with human immunodeficiency virus (HIV) infection should be immunized as soon as possible after diagnosis. Immunization during chemotherapy or radiation therapy should be avoided. Routine use of PPSV23 vaccine is not recommended for American Indians, [place] Natives, or people younger than 65 years unless there are medical conditions that require PPSV23 vaccine. When indicated, people who have unknown immunization and have no record of immunization should receive PPSV23 vaccine. One-time revaccination 5 years after the first dose of PPSV23 is recommended for people aged 19-64 years  who have chronic kidney failure, nephrotic syndrome, asplenia, or immunocompromised conditions. People who received 1-2 doses of PPSV23 before age 21 years should receive another dose of PPSV23 vaccine at age 75 years or later if at least  5 years have passed since the previous dose. Doses of PPSV23 are not needed for people immunized with PPSV23 at or after age 39 years.  Meningococcal vaccine. Adults with asplenia or persistent complement component deficiencies should receive 2 doses of quadrivalent meningococcal conjugate (MenACWY-D) vaccine. The doses should be obtained at least 2 months apart. Microbiologists working with certain meningococcal bacteria, [employer] recruits, people at risk during an outbreak, and people who travel to or live in countries with a high rate of meningitis should be immunized. A first-year college student up through age 86 years who is living in a residence hall should receive a dose if she did not receive a dose on or after her 16th birthday. Adults who have certain high-risk conditions should receive one or more doses of vaccine.  Hepatitis A vaccine. Adults who wish to be protected from this disease, have certain high-risk conditions, work with hepatitis A-infected animals, work in hepatitis A research labs, or travel to or work in countries with a high rate of hepatitis A should be immunized. Adults who were previously unvaccinated and who anticipate close contact with an international adoptee during the first 60 days after arrival in the [place] from a country with a high rate of hepatitis A should be immunized.  Hepatitis B vaccine. Adults who wish to be protected from this disease, have certain high-risk conditions, may be exposed to blood or other infectious body fluids, are household contacts or sex partners of hepatitis B positive people, are clients or workers in certain care facilities, or travel to or work in countries with a high rate of hepatitis B should be  immunized.  Haemophilus influenzae type b (Hib) vaccine. A previously unvaccinated person with asplenia or sickle cell disease or having a scheduled splenectomy should receive 1 dose of Hib vaccine. Regardless of previous immunization, a recipient of a hematopoietic stem cell transplant should receive a 3-dose series 6-12 months after her successful transplant. Hib vaccine is not recommended for adults with HIV infection. Preventive Services / Frequency Ages 96 to 57 years  Blood pressure check.** / Every 3-5 years.  Lipid and cholesterol check.** / Every 5 years beginning at age 29.  Clinical breast exam.** / Every 3 years for women in their 51s and 71s.  BRCA-related cancer risk assessment.** / For women who have family members with a BRCA-related cancer (breast, ovarian, tubal, or peritoneal cancers).  Pap test.** / Every 2 years from ages 97 through 22. Every 3 years starting at age 49 through age 63 or 41 with a history of 3 consecutive normal Pap tests.  HPV screening.** / Every 3 years from ages 27 through ages 39 to 49 with a history of 3 consecutive normal Pap tests.  Hepatitis C blood test.** / For any individual with known risks for hepatitis C.  Skin self-exam. / Monthly.  Influenza vaccine. / Every year.  Tetanus, diphtheria, and acellular pertussis (Tdap, Td) vaccine.** / Consult your health care provider. Pregnant women should receive 1 dose of Tdap vaccine during each pregnancy. 1 dose of Td every 10 years.  Varicella vaccine.** / Consult your health care provider. Pregnant females who do not have evidence of immunity should receive the first dose after pregnancy.  HPV vaccine. / 3 doses over 6 months, if 74 and younger. The vaccine is not recommended for use in pregnant females. However, pregnancy testing is not needed before receiving a dose.  Measles, mumps, rubella (MMR) vaccine.** / You need at least 1 dose of  MMR if you were born in 1957 or later. You may also need  a 2nd dose. For females of childbearing age, rubella immunity should be determined. If there is no evidence of immunity, females who are not pregnant should be vaccinated. If there is no evidence of immunity, females who are pregnant should delay immunization until after pregnancy.  Pneumococcal 13-valent conjugate (PCV13) vaccine.** / Consult your health care provider.  Pneumococcal polysaccharide (PPSV23) vaccine.** / 1 to 2 doses if you smoke cigarettes or if you have certain conditions.  Meningococcal vaccine.** / 1 dose if you are age 75 to 29 years and a Market researcher living in a residence hall, or have one of several medical conditions, you need to get vaccinated against meningococcal disease. You may also need additional booster doses.  Hepatitis A vaccine.** / Consult your health care provider.  Hepatitis B vaccine.** / Consult your health care provider.  Haemophilus influenzae type b (Hib) vaccine.** / Consult your health care provider. Ages 5 to 56 years  Blood pressure check.** / Every year.  Lipid and cholesterol check.** / Every 5 years beginning at age 62 years.  Lung cancer screening. / Every year if you are aged 45-80 years and have a 30-pack-year history of smoking and currently smoke or have quit within the past 15 years. Yearly screening is stopped once you have quit smoking for at least 15 years or develop a health problem that would prevent you from having lung cancer treatment.  Clinical breast exam.** / Every year after age 23 years.  BRCA-related cancer risk assessment.** / For women who have family members with a BRCA-related cancer (breast, ovarian, tubal, or peritoneal cancers).  Mammogram.** / Every year beginning at age 25 years and continuing for as long as you are in good health. Consult with your health care provider.  Pap test.** / Every 3 years starting at age 73 years through age 76 or 34 years with a history of 3 consecutive normal Pap  tests.  HPV screening.** / Every 3 years from ages 48 years through ages 27 to 57 years with a history of 3 consecutive normal Pap tests.  Fecal occult blood test (FOBT) of stool. / Every year beginning at age 96 years and continuing until age 46 years. You may not need to do this test if you get a colonoscopy every 10 years.  Flexible sigmoidoscopy or colonoscopy.** / Every 5 years for a flexible sigmoidoscopy or every 10 years for a colonoscopy beginning at age 87 years and continuing until age 38 years.  Hepatitis C blood test.** / For all people born from [birth year] through 1963 and any individual with known risks for hepatitis C.  Skin self-exam. / Monthly.  Influenza vaccine. / Every year.  Tetanus, diphtheria, and acellular pertussis (Tdap/Td) vaccine.** / Consult your health care provider. Pregnant women should receive 1 dose of Tdap vaccine during each pregnancy. 1 dose of Td every 10 years.  Varicella vaccine.** / Consult your health care provider. Pregnant females who do not have evidence of immunity should receive the first dose after pregnancy.  Zoster vaccine.** / 1 dose for adults aged 37 years or older.  Measles, mumps, rubella (MMR) vaccine.** / You need at least 1 dose of MMR if you were born in 1957 or later. You may also need a second dose. For females of childbearing age, rubella immunity should be determined. If there is no evidence of immunity, females who are not pregnant should be vaccinated. If there  is no evidence of immunity, females who are pregnant should delay immunization until after pregnancy.  Pneumococcal 13-valent conjugate (PCV13) vaccine.** / Consult your health care provider.  Pneumococcal polysaccharide (PPSV23) vaccine.** / 1 to 2 doses if you smoke cigarettes or if you have certain conditions.  Meningococcal vaccine.** / Consult your health care provider.  Hepatitis A vaccine.** / Consult your health care provider.  Hepatitis B vaccine.** / Consult  your health care provider.  Haemophilus influenzae type b (Hib) vaccine.** / Consult your health care provider. Ages 52 years and over  Blood pressure check.** / Every year.  Lipid and cholesterol check.** / Every 5 years beginning at age 47 years.  Lung cancer screening. / Every year if you are aged 60-80 years and have a 30-pack-year history of smoking and currently smoke or have quit within the past 15 years. Yearly screening is stopped once you have quit smoking for at least 15 years or develop a health problem that would prevent you from having lung cancer treatment.  Clinical breast exam.** / Every year after age 1 years.  BRCA-related cancer risk assessment.** / For women who have family members with a BRCA-related cancer (breast, ovarian, tubal, or peritoneal cancers).  Mammogram.** / Every year beginning at age 36 years and continuing for as long as you are in good health. Consult with your health care provider.  Pap test.** / Every 3 years starting at age 61 years through age 71 or 77 years with 3 consecutive normal Pap tests. Testing can be stopped between 65 and 70 years with 3 consecutive normal Pap tests and no abnormal Pap or HPV tests in the past 10 years.  HPV screening.** / Every 3 years from ages 82 years through ages 84 or 27 years with a history of 3 consecutive normal Pap tests. Testing can be stopped between 65 and 70 years with 3 consecutive normal Pap tests and no abnormal Pap or HPV tests in the past 10 years.  Fecal occult blood test (FOBT) of stool. / Every year beginning at age 91 years and continuing until age 36 years. You may not need to do this test if you get a colonoscopy every 10 years.  Flexible sigmoidoscopy or colonoscopy.** / Every 5 years for a flexible sigmoidoscopy or every 10 years for a colonoscopy beginning at age 59 years and continuing until age 34 years.  Hepatitis C blood test.** / For all people born from [birth year] through 1963 and any  individual with known risks for hepatitis C.  Osteoporosis screening.** / A one-time screening for women ages 80 years and over and women at risk for fractures or osteoporosis.  Skin self-exam. / Monthly.  Influenza vaccine. / Every year.  Tetanus, diphtheria, and acellular pertussis (Tdap/Td) vaccine.** / 1 dose of Td every 10 years.  Varicella vaccine.** / Consult your health care provider.  Zoster vaccine.** / 1 dose for adults aged 35 years or older.  Pneumococcal 13-valent conjugate (PCV13) vaccine.** / Consult your health care provider.  Pneumococcal polysaccharide (PPSV23) vaccine.** / 1 dose for all adults aged 70 years and older.  Meningococcal vaccine.** / Consult your health care provider.  Hepatitis A vaccine.** / Consult your health care provider.  Hepatitis B vaccine.** / Consult your health care provider.  Haemophilus influenzae type b (Hib) vaccine.** / Consult your health care provider. ** Family history and personal history of risk and conditions may change your health care provider's recommendations.   This information is not intended to replace advice  given to you by your health care provider. Make sure you discuss any questions you have with your health care provider.   Document Released: 04/17/2001 Document Revised: 03/12/2014 Document Reviewed: 07/17/2010 Elsevier Interactive Patient Education [employer].

## 2015-12-13 LAB — URINE CULTURE: ORGANISM ID, BACTERIA: NO GROWTH

## 2015-12-16 ENCOUNTER — Other Ambulatory Visit: Payer: Self-pay | Admitting: Physician Assistant

## 2015-12-16 DIAGNOSIS — R8281 Pyuria: Secondary | ICD-10-CM

## 2015-12-23 ENCOUNTER — Other Ambulatory Visit (INDEPENDENT_AMBULATORY_CARE_PROVIDER_SITE_OTHER): Payer: Medicare Other

## 2015-12-23 ENCOUNTER — Telehealth: Payer: Self-pay | Admitting: Physician Assistant

## 2015-12-23 ENCOUNTER — Other Ambulatory Visit: Payer: PRIVATE HEALTH INSURANCE

## 2015-12-23 DIAGNOSIS — Z0000 Encounter for general adult medical examination without abnormal findings: Secondary | ICD-10-CM

## 2015-12-23 DIAGNOSIS — R8281 Pyuria: Secondary | ICD-10-CM

## 2015-12-23 DIAGNOSIS — N390 Urinary tract infection, site not specified: Secondary | ICD-10-CM | POA: Diagnosis not present

## 2015-12-23 LAB — URINALYSIS, ROUTINE W REFLEX MICROSCOPIC
Bilirubin Urine: NEGATIVE
HGB URINE DIPSTICK: NEGATIVE
Ketones, ur: NEGATIVE
NITRITE: NEGATIVE
RBC / HPF: NONE SEEN (ref 0–?)
SPECIFIC GRAVITY, URINE: 1.01 (ref 1.000–1.030)
Total Protein, Urine: NEGATIVE
URINE GLUCOSE: NEGATIVE
Urobilinogen, UA: 0.2 (ref 0.0–1.0)
pH: 6 (ref 5.0–8.0)

## 2015-12-23 NOTE — Telephone Encounter (Signed)
Pt dropped off copies of advance directive, documents placed in tray at front office

## 2015-12-23 NOTE — Telephone Encounter (Signed)
Notes Recorded by Donell Sievert Ewing, CMA on 12/16/2015 at 11:05 AM EDT Called the patient informed results/instructions to her husband. Put order in for urine test next Friday 12/23/15/put order in as well. The husband verbally understood/all instructions. ------  Notes Recorded by Brunetta Jeans, PA-C on 12/14/2015 at 8:19 PM EDT Labs good overall. Urine with some abnormal findings including pyruia. Urine culture negative for bladder infection. Would like her to hydrate well and come to lab for repeat UA with Urine micro in 1 week.   Cholesterol panel shows improvement since last check. Would have her work on increasing her exercise (she had started gym regimen recently) and we will keep an eye on cholesterol levels every 6 months. Continue 81 mg ASA daily. ------  Notes Recorded by Caffie Pinto on 12/12/2015 at 3:03 PM EDT Urine cult. Ordered in Epic and faxed to [facility] for send-out. KMP ------  Notes Recorded by Brunetta Jeans, PA-C on 12/12/2015 at 2:29 PM EDT Please add-on urine culture. Thank you.

## 2015-12-23 NOTE — Telephone Encounter (Signed)
Ok with me 

## 2015-12-23 NOTE — Telephone Encounter (Signed)
Caller name: Ardeth  Relation to pt: self  Call back number: Pharmacy:  Reason for call: Pt came in office since she had a lab appt for today (12-23-15), Pt stated that she is having some issues with [facility] and would like to have her last lab [id number]) results and today's lab result mailed to her, if possible.

## 2015-12-23 NOTE — Telephone Encounter (Signed)
ok 

## 2015-12-23 NOTE — Telephone Encounter (Signed)
Patient request to transfer care from Cody Martin to Dr. Copland °

## 2015-12-25 LAB — CULTURE, URINE COMPREHENSIVE: Organism ID, Bacteria: NO GROWTH

## 2015-12-28 DIAGNOSIS — H02824 Cysts of left upper eyelid: Secondary | ICD-10-CM | POA: Diagnosis not present

## 2015-12-28 DIAGNOSIS — H52223 Regular astigmatism, bilateral: Secondary | ICD-10-CM | POA: Diagnosis not present

## 2015-12-28 DIAGNOSIS — Z961 Presence of intraocular lens: Secondary | ICD-10-CM | POA: Diagnosis not present

## 2015-12-28 DIAGNOSIS — H524 Presbyopia: Secondary | ICD-10-CM | POA: Diagnosis not present

## 2015-12-28 DIAGNOSIS — H04123 Dry eye syndrome of bilateral lacrimal glands: Secondary | ICD-10-CM | POA: Diagnosis not present

## 2015-12-29 ENCOUNTER — Telehealth: Payer: Self-pay

## 2015-12-29 NOTE — Telephone Encounter (Signed)
Called patient back,not in will call again.

## 2015-12-29 NOTE — Telephone Encounter (Signed)
Patient returned my call. Called her back and left another message for her to call again.

## 2015-12-29 NOTE — Telephone Encounter (Signed)
-----   Message from Jenne Pane sent at 12/28/2015 10:29 AM EDT ----- Regarding: Retuning call Contact: [phone] Patient is returning your call regarding her lab results.

## 2016-01-31 ENCOUNTER — Telehealth: Payer: Self-pay | Admitting: Physician Assistant

## 2016-01-31 NOTE — Telephone Encounter (Signed)
[name] can you take a look at this. Repeat urine should have been obtained with Dx of pyuria. There is no office visit associated with this test as she only presented to lab for follow-up urine testing.   Tanya Harris will you call patient and let her know that we are taking a look at this to see if we can get better coverage from insurance for the testing. Thank you.

## 2016-01-31 NOTE — Telephone Encounter (Signed)
Pt calling about DOS on 12/23/15 regarding an office visit for urine check, pt states that she received a bill of $9.00 and states that this needs to be recoded and sent back to insurance for payment.

## 2016-02-01 NOTE — Telephone Encounter (Signed)
Spoke with patient about the bill. Advised that the lab have changed the diagnosis for urine. Advised we couldn't guarantee that the bill will be covered. She will mail the bill into the office so we can have management and billing to verify the bill. She is agreeable and will mail to the office since the drive is too far for patient. Advised we will contact her with the outcome once reviewed.

## 2016-02-01 NOTE — Telephone Encounter (Signed)
Marga Melnick, on 12-23-15 there was UA & Culture done coded as Pyuria. KMP

## 2016-02-01 NOTE — Telephone Encounter (Signed)
Can we call patient to let her know that the urine testing was coded with the correct diagnosis of pyuria. It was a follow-up lab and not a lab obtained for a preventive purpose so I believe insurance has paid all that they will. I checked with our lab and they noted the orders had correct diagnosis.  I can have management look at the bill if she brings it in to see if there is a change to make that would help but I cannot promise we will be able to get insurance to pay more.

## 2016-02-21 NOTE — Telephone Encounter (Signed)
Paperwork received and given to Glass blower/designer.

## 2016-04-04 ENCOUNTER — Ambulatory Visit (INDEPENDENT_AMBULATORY_CARE_PROVIDER_SITE_OTHER): Payer: Medicare Other | Admitting: Family Medicine

## 2016-04-04 VITALS — BP 134/76 | HR 72 | Temp 97.5°F | Ht 62.0 in | Wt 165.6 lb

## 2016-04-04 DIAGNOSIS — R252 Cramp and spasm: Secondary | ICD-10-CM | POA: Diagnosis not present

## 2016-04-04 DIAGNOSIS — R197 Diarrhea, unspecified: Secondary | ICD-10-CM

## 2016-04-04 DIAGNOSIS — R238 Other skin changes: Secondary | ICD-10-CM

## 2016-04-04 LAB — CBC
HCT: 42.8 % (ref 36.0–46.0)
Hemoglobin: 14.8 g/dL (ref 12.0–15.0)
MCHC: 34.5 g/dL (ref 30.0–36.0)
MCV: 89.4 fl (ref 78.0–100.0)
PLATELETS: 232 10*3/uL (ref 150.0–400.0)
RBC: 4.79 Mil/uL (ref 3.87–5.11)
RDW: 13.4 % (ref 11.5–15.5)
WBC: 6.5 10*3/uL (ref 4.0–10.5)

## 2016-04-04 LAB — COMPREHENSIVE METABOLIC PANEL
ALBUMIN: 3.9 g/dL (ref 3.5–5.2)
ALT: 14 U/L (ref 0–35)
AST: 23 U/L (ref 0–37)
Alkaline Phosphatase: 66 U/L (ref 39–117)
BILIRUBIN TOTAL: 0.5 mg/dL (ref 0.2–1.2)
BUN: 16 mg/dL (ref 6–23)
CALCIUM: 9.7 mg/dL (ref 8.4–10.5)
CHLORIDE: 102 meq/L (ref 96–112)
CO2: 30 mEq/L (ref 19–32)
CREATININE: 0.59 mg/dL (ref 0.40–1.20)
GFR: 103.84 mL/min (ref >60.00)
Glucose, Bld: 93 mg/dL (ref 70–99)
Potassium: 3.7 mEq/L (ref 3.5–5.1)
SODIUM: 138 meq/L (ref 135–145)
TOTAL PROTEIN: 7.4 g/dL (ref 6.0–8.3)

## 2016-04-04 MED ORDER — FLUOCINOLONE ACETONIDE 0.01 % OT OIL: TOPICAL_OIL | OTIC | 1 refills | Status: DC

## 2016-04-04 NOTE — Progress Notes (Signed)
[place] [facility] [address] [place], [place] [address] [phone]  Date:  04/04/2016   Name:  Tanya Harris   DOB:  01/01/1933   MRN:  [id number]  PCP:  Leeanne Rio, PA-C    Chief Complaint: Follow-up (Former pt of Elsie. c/o diarrhea x 3 weeks. Flu vaccine )   History of Present Illness:  Tanya Harris is a 83 y.o. very pleasant female patient who presents with the following:  Here today to establish care with me since Einar Pheasant has moved to a new location History of COPD, osteopenia, hearing loss, hyperlipidemia Last visit here in October with Cody:  1. Hyperlipidemia -- Previously controlled with diet and exercise. No history of CAD, MI or CVA. Is currently on fish oil and 81 mg ASA daily. Endorses well-balanced diet. Avoiding sweets. Mainly water to drink. Patient is exercising every day -- 20 minutes walking and 30 minutes aerobic exercise.  2. Patient endorses skin lesion of left breast first noted this week. Denies pain or itch. Denies change in size or appearance since first noted. Is followed by Dermatology. Would like assessed today. 3. Patient with history of cataract. Is requesting referral to a new ophthalmologist here in the [place] area.  Her lipids have been under reasonable control with diet and supplement as below. She uses advair for her COPD, also flonase nasal spray  She notes that she has had diarrhea "for a while," she has noted this for 3-4 weeks.   She otherwise feels well. At this time her stools are watery- no belly pain or vomiting.  She may go 2-3x a day.  She has not noted any blood in her stools No recent travel or abx use.    No recent camping, drinking from streams, etc No sick contacts with same issue She has had this sort of problem a few years ago- it lasted about 10 days at that time and self- resolved She did take a little imodium- she is not sure if this helped her however.     Prior to onset of her diarrhea she did have some constipation and might occasionally take an OTC laxative She is no longer using any laxatives Last colonoscopy was in 2012- done in [place].  She was noted to have some diverticula and hemorrhoids. She was supposed to repeat this in 2015 but did not get around to it due to the move  Moved to this area to be closer to her children- one son is here and the other in [place]. They have 2 grandchildren.  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  She notes that she will take her mobic about once a week.  She does exercise 5x a week and does some walking, and swimming  She uses a walker or a cane for support.   She has not noted any urinary sx  She also notes that her scalp has been dry and flaky.  Not responding well to dandruff shampoo- she wonders if we have any other treatment that she might try   Lab Results  Component Value Date   CHOL 204 (H) 12/12/2015  CHOL 221 (H) 07/13/2015   CHOL 235 (H) 12/08/2014   Lab Results  Component Value Date   HDL 47.10 12/12/2015   HDL 55.20 07/13/2015   HDL 57.70 12/08/2014   Lab Results  Component Value Date   LDLCALC 135 (H) 12/12/2015   LDLCALC 143 (H) 07/13/2015   LDLCALC 158 (H) 12/08/2014   Lab Results  Component Value Date   TRIG 107.0 12/12/2015   TRIG 111.0 07/13/2015   TRIG 98.0 12/08/2014   Lab Results  Component Value Date   CHOLHDL 4 12/12/2015   CHOLHDL 4 07/13/2015   CHOLHDL 4 12/08/2014   No results found for: LDLDIRECT   Patient Active Problem List   Diagnosis Date Noted  . Lesion of skin of breast 12/12/2015  . Coccygeal contusion 05/02/2015  . Screening, ischemic heart disease 12/08/2014  . Hearing loss 12/08/2014  .  Hyperlipidemia 06/04/2014  . Medicare annual wellness visit, subsequent 11/30/2013  . COPD with chronic bronchitis ([facility]) 11/02/2013  . Osteopenia 11/02/2013  . Gait instability 11/02/2013    Past Medical History:  Diagnosis Date  . Arthritis, rheumatoid ([facility])    Spine  . COPD (chronic obstructive pulmonary disease) ([facility])    Mild  . DJD (degenerative joint disease) of knee   . Scoliosis   . Spinal stenosis     Past Surgical History:  Procedure Laterality Date  . APPENDECTOMY  2005  . CATARACT EXTRACTION     Bilateral  . NASAL SINUS SURGERY    . TONSILLECTOMY    . TOTAL KNEE ARTHROPLASTY  2001   Left    Social History  Substance Use Topics  . Smoking status: Never Smoker  . Smokeless tobacco: Not on file  . Alcohol use Not on file    Family History  Problem Relation Age of Onset  . Heart disease Mother 82    Deceased  . Diabetes Mother   . Heart disease Father 108    Deceased  . Asthma Father   . Heart disease Maternal Uncle   . Colon cancer Brother   . Diabetes Brother     #2  . Heart disease Brother     x2  . Healthy Sister   . Healthy Brother     x1  . Hyperlipidemia Son     x2    Allergies  Allergen Reactions  . Amoxicillin Nausea And Vomiting  . Ibuprofen Swelling    Mouth Swelling     Medication list has been reviewed and updated.  Current Outpatient Prescriptions on File Prior to Visit  Medication Sig Dispense Refill  . aspirin 81 MG tablet Take 81 mg by mouth daily.    . Calcium Carbonate-Vitamin D (CALCIUM 600+D) 600-400 MG-UNIT per tablet Take 2 tablets by mouth daily.    . fluticasone (FLONASE) 50 MCG/ACT nasal spray Place 2 sprays into both nostrils daily. 16 g 5  . Fluticasone-Salmeterol (ADVAIR) 250-50 MCG/DOSE AEPB Inhale 1 puff into the lungs 2 (two) times daily. (Patient taking differently: Inhale 1 puff into the lungs 2 (two) times daily as needed. ) 60 each 5  . glucosamine-chondroitin 500-400 MG tablet Take 2 tablets by mouth  daily.    Marland Kitchen guaiFENesin (MUCINEX) 600 MG 12 hr tablet Take 600 mg by mouth daily as needed.     . meloxicam (MOBIC) 15 MG tablet Take 1 tablet (15 mg total) by mouth daily as needed for pain. 30 tablet 0  . metroNIDAZOLE (METROCREAM) 0.75 % cream Apply 1 application topically  2 (two) times daily as needed.     . Multiple Vitamin (MULTIVITAMIN) tablet Take 1 tablet by mouth daily.    Marland Kitchen omega-3 fish oil (MAXEPA) 1000 MG CAPS capsule Take 2 capsules by mouth daily. TAKING 1200mg  DAILY     No current facility-administered medications on file prior to visit.     Review of Systems:  As per HPI- otherwise negative.   Physical Examination: Vitals:   04/04/16 1002  BP: 134/76  Pulse: 72  Temp: 97.5 F (36.4 C)   Vitals:   04/04/16 1002  Weight: 165 lb 9.6 oz (75.1 kg)  Height: 5\' 2"  (1.575 m)   Body mass index is 30.29 kg/m. Ideal Body Weight: Weight in (lb) to have BMI = 25: 136.4  GEN: WDWN, NAD, Non-toxic, A & O x 3, older lady who looks quite well today HEENT: Atraumatic, Normocephalic. Neck supple. No masses, No LAD.  Bilateral TM wnl, oropharynx normal.  PEERL,EOMI.   She does have a dry scalp with some flaking  Ears and Nose: No external deformity. CV: RRR, No M/G/R. No JVD. No thrill. No extra heart sounds. PULM: CTA B, no wheezes, crackles, rhonchi. No retractions. No resp. distress. No accessory muscle use. ABD: S, NT, ND, +BS. No rebound. No HSM.  Belly exam is bening EXTR: No c/c/e NEURO Normal gait for pt- uses a walker for support.  Slow gait, favors her right hip  PSYCH: Normally interactive. Conversant. Not depressed or anxious appearing.  Calm demeanor.    Assessment and Plan: Diarrhea, unspecified type - Plan: CBC, Comprehensive metabolic panel, Stool culture, Clostridium difficile EIA  Leg cramps  Dry scalp - Plan: Fluocinolone Acetonide 0.01 % OIL  Here today with complaint of diarrhea for close to one month.  She is otherwise feeling well. Will get lab  eval as above, and encouraged her to try imodium for her sx Will also try fluocinolone scalp oil for her dry flaky scalp Will plan further follow- up pending labs.   Signed Lamar Blinks, MD

## 2016-04-04 NOTE — Patient Instructions (Signed)
We are going to do some labs and stool tests today to try and figure out the source of your diarrhea. In the meantime try using some OTC imodium as needed for your symptoms.  If you are getting worse please let me know! Try the scalp oil for your itchy dry scalp and forehead; let me know if not helpful The lab will give you a kit to collect your stool sample at home

## 2016-04-06 ENCOUNTER — Telehealth: Payer: Self-pay | Admitting: Physician Assistant

## 2016-04-06 NOTE — Telephone Encounter (Signed)
Patient called stating that she was supposed to leave a stool sample for Dr. Lorelei Pont however her stool has changed. It has changed from watery to thick but  It is still not solid. She states that she will not be leaving the stool sample as of now, but will see how it goes over the weekend.

## 2016-04-25 ENCOUNTER — Encounter: Payer: Self-pay | Admitting: Family Medicine

## 2016-07-05 DIAGNOSIS — H6123 Impacted cerumen, bilateral: Secondary | ICD-10-CM | POA: Diagnosis not present

## 2016-11-13 ENCOUNTER — Telehealth: Payer: Self-pay | Admitting: Family Medicine

## 2016-11-13 NOTE — Telephone Encounter (Signed)
Self.    Refill for Advair, Fluticasone, Meloxicam   Pharmacy: [id number] - [place], [place] - [address] AT [place]

## 2016-11-14 ENCOUNTER — Other Ambulatory Visit: Payer: Self-pay | Admitting: Emergency Medicine

## 2016-11-14 MED ORDER — FLUTICASONE PROPIONATE 50 MCG/ACT NA SUSP: 2.0000 | Freq: Every day | NASAL | 5 refills | Status: DC

## 2016-11-14 MED ORDER — FLUTICASONE-SALMETEROL 250-50 MCG/DOSE IN AEPB: 1.0000 | INHALATION_SPRAY | Freq: Two times a day (BID) | RESPIRATORY_TRACT | 5 refills | Status: DC

## 2016-11-14 NOTE — Telephone Encounter (Signed)
Refill sent for Advair and Fluticosone. Sent request for Meloxicam to Dr. Lorelei Pont for approval.

## 2016-11-21 ENCOUNTER — Other Ambulatory Visit: Payer: Self-pay | Admitting: Emergency Medicine

## 2016-11-21 MED ORDER — MELOXICAM 15 MG PO TABS: 15.0000 mg | ORAL_TABLET | Freq: Every day | ORAL | 1 refills | Status: DC | PRN

## 2016-11-21 NOTE — Telephone Encounter (Signed)
Called pt- she requests mobic but has ibuprofen listed as an allergy.  Confirmed that she is taking mobic on a regular basis (about once a week) and not having any problems with allergy to this medication. Will refill for her

## 2016-12-14 ENCOUNTER — Ambulatory Visit: Payer: PRIVATE HEALTH INSURANCE | Admitting: *Deleted

## 2016-12-14 NOTE — Progress Notes (Signed)
Subjective:   Tanya Harris is a 83 y.o. female who presents for Medicare Annual (Subsequent) preventive examination.  Review of Systems:  No ROS.  Medicare Wellness Visit. Additional risk factors are reflected in the social history.  Cardiac Risk Factors include: advanced age (>96men, >33 women);dyslipidemia Sleep patterns: takes Melatonin 3 mg to help with sleep.   Female:   Pap- No longer doing routine screening due to age.       Mammo-   ORDERED TODAY     Dexa scan-       ORDERED TODAY [facility]- last 11/22/10. No longer doing routine screening due to age.   Objective:     Vitals: BP 137/76 (BP Location: Left Arm, Patient Position: Sitting, Cuff Size: Normal)   Pulse 74   Ht 5\' 2"  (1.575 m)   Wt 160 lb (72.6 kg)   SpO2 99%   BMI 29.26 kg/m   Body mass index is 29.26 kg/m.   Tobacco History  Smoking Status  . Never Smoker  Smokeless Tobacco  . Never Used     Counseling given: Not Answered   Past Medical History:  Diagnosis Date  . Arthritis, rheumatoid ([place])    Spine  . COPD (chronic obstructive pulmonary disease) (HCC)    Mild  . DJD (degenerative joint disease) of knee   . Scoliosis   . Spinal stenosis    Past Surgical History:  Procedure Laterality Date  . APPENDECTOMY  2005  . CATARACT EXTRACTION     Bilateral  . NASAL SINUS SURGERY    . TONSILLECTOMY    . TOTAL KNEE ARTHROPLASTY  2001   Left   Family History  Problem Relation Age of Onset  . Heart disease Mother 61       Deceased  . Diabetes Mother   . Heart disease Father 18       Deceased  . Asthma Father   . Heart disease Maternal Uncle   . Colon cancer Brother   . Diabetes Brother        #2  . Heart disease Brother        x2  . Healthy Sister   . Healthy Brother        x1  . Hyperlipidemia Son        x2   History  Sexual Activity  . Sexual activity: Not on file    Outpatient Encounter Prescriptions as of 12/17/2016  Medication Sig  . aspirin 81 MG tablet Take 81 mg by  mouth daily.  . Calcium Carbonate-Vitamin D (CALCIUM 600+D) 600-400 MG-UNIT per tablet Take 2 tablets by mouth daily.  . Fluocinolone Acetonide 0.01 % OIL Apply to scalp as needed for dryness- leave on at least 4 hours and then wash  . fluticasone (FLONASE) 50 MCG/ACT nasal spray Place 2 sprays into both nostrils daily.  . Fluticasone-Salmeterol (ADVAIR) 250-50 MCG/DOSE AEPB Inhale 1 puff into the lungs 2 (two) times daily.  Marland Kitchen glucosamine-chondroitin 500-400 MG tablet Take 2 tablets by mouth daily.  Marland Kitchen guaiFENesin (MUCINEX) 600 MG 12 hr tablet Take 600 mg by mouth daily as needed.   . meloxicam (MOBIC) 15 MG tablet Take 1 tablet (15 mg total) by mouth daily as needed for pain.  . metroNIDAZOLE (METROCREAM) 0.75 % cream Apply 1 application topically 2 (two) times daily as needed.   . Multiple Vitamin (MULTIVITAMIN) tablet Take 1 tablet by mouth daily.  Marland Kitchen omega-3 fish oil (MAXEPA) 1000 MG CAPS capsule Take 2 capsules by mouth  daily. TAKING 1200mg  DAILY   No facility-administered encounter medications on file as of 12/17/2016.     Activities of Daily Living In your present state of health, do you have any difficulty performing the following activities: 12/17/2016  Hearing? Y  Comment declines audiology referral.  Vision? N  Comment wearing glasses. Dr.Patel yearly.  Difficulty concentrating or making decisions? N  Walking or climbing stairs? Y  Comment uses walker   Dressing or bathing? N  Doing errands, shopping? N  Preparing Food and eating ? N  Using the Toilet? N  In the past six months, have you accidently leaked urine? N  Do you have problems with loss of bowel control? N  Managing your Medications? N  Managing your Finances? N  Housekeeping or managing your Housekeeping? N  Some recent data might be hidden    Patient Care Team: Copland, Gay Filler, MD as PCP - General (Family Medicine)    Assessment:    Physical assessment deferred to PCP.  Exercise Activities and Dietary  recommendations Current Exercise Habits: Structured exercise class, Time (Minutes): 45, Frequency (Times/Week): 5, Weekly Exercise (Minutes/Week): 225, Intensity: Mild Diet (meal preparation, eat out, water intake, caffeinated beverages, dairy products, fruits and vegetables): in general, a "healthy" diet     Goals      Patient Stated   . Maintain current healthy lifestyle. (pt-stated)      Fall Risk Fall Risk  12/17/2016 05/02/2015 11/30/2013 10/22/2013  Falls in the past year? No Yes No No  Number falls in past yr: - 1 - -  Injury with Fall? - Yes - -  Comment - Tailbone - -  Risk Factor Category  - High Fall Risk - -  Risk for fall due to : - Impaired balance/gait Impaired balance/gait Impaired balance/gait  Follow up - Falls prevention discussed - -   Depression Screen PHQ 2/9 Scores 12/17/2016 05/02/2015 11/30/2013 10/22/2013  PHQ - 2 Score 0 0 0 0  Exception Documentation - Patient refusal - -     Cognitive Function MMSE - Mini Mental State Exam 12/17/2016  Orientation to time 5  Orientation to Place 5  Registration 3  Attention/ Calculation 5  Recall 3  Language- name 2 objects 2  Language- repeat 1  Language- follow 3 step command 3  Language- read & follow direction 1  Write a sentence 1  Copy design 1  Total score 30        Immunization History  Administered Date(s) Administered  . Influenza, High Dose Seasonal PF 11/30/2013  . Influenza,inj,Quad PF,6+ Mos 12/12/2015  . Pneumococcal Conjugate-13 06/01/2014  . Pneumococcal Polysaccharide-23 12/12/2015  . Zoster 03/05/2010   Screening Tests Health Maintenance  Topic Date Due  . TETANUS/TDAP  09/27/1953  . INFLUENZA VACCINE  10/03/2016  . DEXA SCAN  Completed  . PNA vac Low Risk Adult  Completed      Plan:   Follow up with PCP today as scheduled.  Continue to eat heart healthy diet (full of fruits, vegetables, whole grains, lean protein, water--limit salt, fat, and sugar intake) and increase physical  activity as tolerated.  Continue doing brain stimulating activities (puzzles, reading, adult coloring books, staying active) to keep memory sharp.   I have personally reviewed and noted the following in the patient's chart:   . Medical and social history . Use of alcohol, tobacco or illicit drugs  . Current medications and supplements . Functional ability and status . Nutritional status . Physical activity . Advanced  directives . List of other physicians . Hospitalizations, surgeries, and ER visits in previous 12 months . Vitals . Screenings to include cognitive, depression, and falls . Referrals and appointments  In addition, I have reviewed and discussed with patient certain preventive protocols, quality metrics, and best practice recommendations. A written personalized care plan for preventive services as well as general preventive health recommendations were provided to patient.     Shela Nevin, [place]  12/17/2016

## 2016-12-16 NOTE — Progress Notes (Addendum)
[facility] [address], [place], [place] [address] [phone]  Date:  12/17/2016   Name:  Stela Iwasaki   DOB:  26-Oct-1932   MRN:  [id number]  PCP:  Darreld Mclean, MD    Chief Complaint: Medicare Wellness (with RN)   History of Present Illness:  Kyree Fedorko is a 83 y.o. very pleasant female patient who presents with the following:  Follow-up visit with me today.  She is also having her RN MWE today I saw her in January as a new patient to my practice- she had been a pt of [place] before he moved to [place]  Her lipids have been under reasonable control with diet and supplement as below. She uses advair for her COPD, also flonase nasal spray  She notes that she has had diarrhea "for a while," she has noted this for 3-4 weeks.   She otherwise feels well. At this time her stools are watery- no belly pain or vomiting.  She may go 2-3x a day.  She has not noted any blood in her stools No recent travel or abx use.    No recent camping, drinking from streams, etc No sick contacts with same issue She has had this sort of problem a few years ago- it lasted about 10 days at that time and self- resolved She did take a little imodium- she is not sure if this helped her however.    Prior to onset of her diarrhea she did have some constipation and might occasionally take an OTC laxative She is no longer using any laxatives Last colonoscopy was in 2012- done in [place].  She was noted to have some diverticula and hemorrhoids. She was supposed to repeat this in 2015 but did not get around to it due to the move  Moved to this area to be closer to her children- one son is here and the other in [place] [employer]. They have 2 grandchildren.  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 Her bowels continue to be ok  Due for flu shot: done today Labs (if fasting)- she is fasting today Shingrix? She is due for a bone density and mammogram- ordered for her today  She does have some joint pains-she feels like meloxicam really helps her.  However she is afraid that she will become "addicted" to this medication so it only taking it once a week.  Tylenol does not seem to help her  She does use a walker for getting around, and she does also use a cane as needed She did go to ortho a few years ago- however this was not in [place].  At that time she was told that an injection for her spine would not be helpful as she was not having any pain  She last had x-rays of her back and hip about 3 years ago.   She lives in [place]  She does have COPD and also allergies.  She has "mild" COPD and uses advair She walks around the gym for 20 minutes 5 days a week, and also does some water exercises She lives alone in her house She goes to the sports center for her exercise  She is due for a tetanus shot but  declines due to cost - if she has any injury she will have this done for sure  Patient Active Problem List   Diagnosis Date Noted  . Lesion of skin of breast 12/12/2015  . Coccygeal contusion 05/02/2015  . Screening, ischemic heart disease 12/08/2014  . Hearing loss 12/08/2014  . Hyperlipidemia 06/04/2014  . Medicare annual wellness visit, subsequent 11/30/2013  . COPD with chronic bronchitis ([place]) 11/02/2013  . Osteopenia 11/02/2013  . Gait instability 11/02/2013    Past Medical History:  Diagnosis Date  . COPD (chronic obstructive pulmonary disease) (HCC)    Mild  . DJD (degenerative joint disease) of knee   . Scoliosis   . Spinal stenosis     Past Surgical History:  Procedure Laterality Date  . APPENDECTOMY  2005  . CATARACT EXTRACTION     Bilateral  . NASAL SINUS SURGERY    . TONSILLECTOMY    . TOTAL KNEE ARTHROPLASTY  2001   Left    Social History   Substance Use Topics  . Smoking status: Never Smoker  . Smokeless tobacco: Never Used  . Alcohol use No    Family History  Problem Relation Age of Onset  . Heart disease Mother 9       Deceased  . Diabetes Mother   . Heart disease Father 4       Deceased  . Asthma Father   . Heart disease Maternal Uncle   . Colon cancer Brother   . Diabetes Brother        #2  . Heart disease Brother        x2  . Healthy Sister   . Healthy Brother        x1  . Hyperlipidemia Son        x2    Allergies  Allergen Reactions  . Amoxicillin Nausea And Vomiting  . Ibuprofen Swelling    Mouth Swelling- however pt confirms that she is able to take mobic with no problems 11/2016     Medication list has been reviewed and updated.  Current Outpatient Prescriptions on File Prior to Visit  Medication Sig Dispense Refill  . aspirin 81 MG tablet Take 81 mg by mouth daily.    . Calcium Carbonate-Vitamin D (CALCIUM 600+D) 600-400 MG-UNIT per tablet Take 2 tablets by mouth daily.    . Fluocinolone Acetonide 0.01 % OIL Apply to scalp as needed for dryness- leave on at least 4 hours and then wash 60 mL 1  . fluticasone (FLONASE) 50 MCG/ACT nasal spray Place 2 sprays into both nostrils daily. 16 g 5  . Fluticasone-Salmeterol (ADVAIR) 250-50 MCG/DOSE AEPB Inhale 1 puff into the lungs 2 (two) times daily. 60 each 5  . glucosamine-chondroitin 500-400 MG tablet Take 2 tablets by mouth daily.    Marland Kitchen guaiFENesin (MUCINEX) 600 MG 12 hr tablet Take 600 mg by mouth daily as needed.     . meloxicam (MOBIC) 15 MG tablet Take 1 tablet (15 mg total) by mouth daily as needed for pain. 30 tablet 1  . metroNIDAZOLE (METROCREAM) 0.75 % cream Apply 1 application topically 2 (two) times daily as needed.     . Multiple Vitamin (MULTIVITAMIN) tablet Take 1 tablet by mouth daily.    Marland Kitchen omega-3 fish oil (MAXEPA) 1000 MG CAPS capsule Take 2 capsules by mouth daily. TAKING 1200mg  DAILY     No current facility-administered  medications on file prior to visit.     Review of Systems:  As per HPI-  otherwise negative.   Physical Examination: Vitals:   12/17/16 0903  BP: 137/76  Pulse: 74  SpO2: 99%   Vitals:   12/17/16 0903  Weight: 160 lb (72.6 kg)  Height: 5\' 2"  (1.575 m)   Body mass index is 29.26 kg/m. Ideal Body Weight: Weight in (lb) to have BMI = 25: 136.4  GEN: WDWN, NAD, Non-toxic, A & O x 3, well appearing elderly lady, overweight HEENT: Atraumatic, Normocephalic. Neck supple. No masses, No LAD. Ears and Nose: No external deformity. CV: RRR, No M/G/R. No JVD. No thrill. No extra heart sounds. PULM: CTA B, no wheezes, crackles, rhonchi. No retractions. No resp. distress. No accessory muscle use. ABD: S, NT, ND, +BS. No rebound. No HSM. EXTR: No c/c/e NEURO Normal gait for pt, uses a rolling walker Right hip- no pain with flexion/ internal or external rotation PSYCH: Normally interactive. Conversant. Not depressed or anxious appearing.  Calm demeanor.    Assessment and Plan: Postmenopausal - Plan: DG Bone Density  Breast cancer screening - Plan: MM DIGITAL SCREENING BILATERAL  Encounter for Medicare annual wellness exam  Encounter for immunization - Plan: Flu vaccine HIGH DOSE PF  Hyperlipidemia, unspecified hyperlipidemia type - Plan: Lipid panel  Right hip pain - Plan: Ambulatory referral to Orthopedic Surgery  Other idiopathic scoliosis, thoracolumbar region - Plan: Ambulatory referral to Orthopedic Surgery  Screening for deficiency anemia - Plan: CBC  Screening for diabetes mellitus - Plan: Comprehensive metabolic panel  Here today for a CPE MWE done today as well Flu shot today Referral to ortho to discuss her hip and back Will plan further follow- up pending labs. Referral for bone density and mammo  I have also reviewed MWE by Ms. Vevelyn Royals, RN and agree with her documentation  Signed Lamar Blinks, MD  Results for orders placed or performed in visit on 12/17/16   CBC  Result Value Ref Range   WBC 5.2 4.0 - 10.5 K/uL   RBC 4.94 3.87 - 5.11 Mil/uL   Platelets 236.0 150.0 - 400.0 K/uL   Hemoglobin 15.4 (H) 12.0 - 15.0 g/dL   HCT 44.8 36.0 - 46.0 %   MCV 90.6 78.0 - 100.0 fl   MCHC 34.3 30.0 - 36.0 g/dL   RDW 13.6 11.5 - 15.5 %  Comprehensive metabolic panel  Result Value Ref Range   Sodium 137 135 - 145 mEq/L   Potassium 4.8 3.5 - 5.1 mEq/L   Chloride 102 96 - 112 mEq/L   CO2 29 19 - 32 mEq/L   Glucose, Bld 94 70 - 99 mg/dL   BUN 17 6 - 23 mg/dL   Creatinine, Ser 0.52 0.40 - 1.20 mg/dL   Total Bilirubin 0.4 0.2 - 1.2 mg/dL   Alkaline Phosphatase 79 39 - 117 U/L   AST 25 0 - 37 U/L   ALT 14 0 - 35 U/L   Total Protein 7.4 6.0 - 8.3 g/dL   Albumin 4.0 3.5 - 5.2 g/dL   Calcium 9.9 8.4 - 10.5 mg/dL   GFR 119.92 >60.00 mL/min  Lipid panel  Result Value Ref Range   Cholesterol 229 (H) 0 - 200 mg/dL   Triglycerides 103.0 0.0 - 149.0 mg/dL   HDL 52.80 >39.00 mg/dL   VLDL 20.6 0.0 - 40.0 mg/dL   LDL Cholesterol 155 (H) 0 - 99 mg/dL   Total CHOL/HDL Ratio 4    NonHDL 175.74    Message to pt with labs-

## 2016-12-17 ENCOUNTER — Encounter: Payer: Self-pay | Admitting: Family Medicine

## 2016-12-17 ENCOUNTER — Ambulatory Visit (INDEPENDENT_AMBULATORY_CARE_PROVIDER_SITE_OTHER): Payer: Medicare Other | Admitting: Family Medicine

## 2016-12-17 VITALS — BP 137/76 | HR 74 | Ht 62.0 in | Wt 160.0 lb

## 2016-12-17 DIAGNOSIS — Z130 Encounter for screening for diseases of the blood and blood-forming organs and certain disorders involving the immune mechanism: Secondary | ICD-10-CM

## 2016-12-17 DIAGNOSIS — Z131 Encounter for screening for diabetes mellitus: Secondary | ICD-10-CM

## 2016-12-17 DIAGNOSIS — Z0000 Encounter for general adult medical examination without abnormal findings: Secondary | ICD-10-CM | POA: Diagnosis not present

## 2016-12-17 DIAGNOSIS — Z1239 Encounter for other screening for malignant neoplasm of breast: Secondary | ICD-10-CM

## 2016-12-17 DIAGNOSIS — M4125 Other idiopathic scoliosis, thoracolumbar region: Secondary | ICD-10-CM

## 2016-12-17 DIAGNOSIS — Z23 Encounter for immunization: Secondary | ICD-10-CM | POA: Diagnosis not present

## 2016-12-17 DIAGNOSIS — Z780 Asymptomatic menopausal state: Secondary | ICD-10-CM | POA: Diagnosis not present

## 2016-12-17 DIAGNOSIS — M25551 Pain in right hip: Secondary | ICD-10-CM

## 2016-12-17 DIAGNOSIS — E785 Hyperlipidemia, unspecified: Secondary | ICD-10-CM | POA: Diagnosis not present

## 2016-12-17 LAB — COMPREHENSIVE METABOLIC PANEL
ALBUMIN: 4 g/dL (ref 3.5–5.2)
ALK PHOS: 79 U/L (ref 39–117)
ALT: 14 U/L (ref 0–35)
AST: 25 U/L (ref 0–37)
BILIRUBIN TOTAL: 0.4 mg/dL (ref 0.2–1.2)
BUN: 17 mg/dL (ref 6–23)
CO2: 29 mEq/L (ref 19–32)
CREATININE: 0.52 mg/dL (ref 0.40–1.20)
Calcium: 9.9 mg/dL (ref 8.4–10.5)
Chloride: 102 mEq/L (ref 96–112)
GFR: 119.92 mL/min (ref >60.00)
GLUCOSE: 94 mg/dL (ref 70–99)
Potassium: 4.8 mEq/L (ref 3.5–5.1)
SODIUM: 137 meq/L (ref 135–145)
TOTAL PROTEIN: 7.4 g/dL (ref 6.0–8.3)

## 2016-12-17 LAB — CBC
HCT: 44.8 % (ref 36.0–46.0)
Hemoglobin: 15.4 g/dL — ABNORMAL HIGH (ref 12.0–15.0)
MCHC: 34.3 g/dL (ref 30.0–36.0)
MCV: 90.6 fl (ref 78.0–100.0)
Platelets: 236 10*3/uL (ref 150.0–400.0)
RBC: 4.94 Mil/uL (ref 3.87–5.11)
RDW: 13.6 % (ref 11.5–15.5)
WBC: 5.2 10*3/uL (ref 4.0–10.5)

## 2016-12-17 LAB — LIPID PANEL
CHOLESTEROL: 229 mg/dL — AB (ref 0–200)
HDL: 52.8 mg/dL (ref >39.00)
LDL Cholesterol: 155 mg/dL — ABNORMAL HIGH (ref 0–99)
NONHDL: 175.74
Total CHOL/HDL Ratio: 4
Triglycerides: 103 mg/dL (ref 0.0–149.0)
VLDL: 20.6 mg/dL (ref 0.0–40.0)

## 2016-12-17 NOTE — Patient Instructions (Addendum)
It was a pleasure to see you as always!  Take care and I will be in touch with your labs We will schedule your bone density test and mammogram I will set you up to see orthopedics about your back and hip Continue your exercise program Please see me in about 6 months If you need to take your meloxicam on a more regular basis for your joint pains that is ok!   Denny Peon MD  Ms. Schwenke , Thank you for taking time to come for your Medicare Wellness Visit. I appreciate your ongoing commitment to your health goals. Please review the following plan we discussed and let me know if I can assist you in the future.   These are the goals we discussed: Goals      Patient Stated   . Maintain current healthy lifestyle. (pt-stated)       This is a list of the screening recommended for you and due dates:  Health Maintenance  Topic Date Due  . Tetanus Vaccine  09/27/1953  . Flu Shot  10/03/2016  . DEXA scan (bone density measurement)  Completed  . Pneumonia vaccines  Completed   Continue to eat heart healthy diet (full of fruits, vegetables, whole grains, lean protein, water--limit salt, fat, and sugar intake) and increase physical activity as tolerated.  Continue doing brain stimulating activities (puzzles, reading, adult coloring books, staying active) to keep memory sharp.    Medical Screening Exam A medical screening exam helps determine whether or not you need emergency medical treatment. During the medical screening exam, a health care provider does a short physical exam and medical history to assess:  Your current symptoms.  Your overall health.  Depending on your symptoms, you may need additional tests. What are the possible outcomes of a medical screening exam? Your medical screening exam may determine that:  You do not need emergency treatment at this time.  You need treatment right away.  You need to be transferred to another medical center.  When should I seek medical  care? If you have a regular health care provider, make an appointment for a follow-up visit with him or her. If you do not have a regular health care provider, ask about resources in your community. Get help right away if: Your condition may change over time. If your condition gets worse or you develop new or troubling symptoms before you see your health care provider, go to an emergency department right away. In an emergency:  Call 911 or have someone drive you to the nearest hospital.  Do not drive yourself. This information is not intended to replace advice given to you by your health care provider. Make sure you discuss any questions you have with your health care provider. Document Released: 03/29/2004 Document Revised: 11/01/2015 Document Reviewed: 12/02/2014 Elsevier Interactive Patient Education  [employer].

## 2016-12-18 ENCOUNTER — Encounter (HOSPITAL_BASED_OUTPATIENT_CLINIC_OR_DEPARTMENT_OTHER): Payer: Self-pay

## 2016-12-18 ENCOUNTER — Ambulatory Visit (HOSPITAL_BASED_OUTPATIENT_CLINIC_OR_DEPARTMENT_OTHER)
Admission: RE | Admit: 2016-12-18 | Discharge: 2016-12-18 | Disposition: A | Discharge status: Home / Self Care | Payer: Medicare Other | Source: Ambulatory Visit | Attending: Family Medicine | Admitting: Family Medicine

## 2016-12-18 DIAGNOSIS — Z780 Asymptomatic menopausal state: Secondary | ICD-10-CM | POA: Diagnosis not present

## 2016-12-18 DIAGNOSIS — M85852 Other specified disorders of bone density and structure, left thigh: Secondary | ICD-10-CM | POA: Insufficient documentation

## 2016-12-18 DIAGNOSIS — Z1239 Encounter for other screening for malignant neoplasm of breast: Secondary | ICD-10-CM

## 2016-12-18 DIAGNOSIS — Z1231 Encounter for screening mammogram for malignant neoplasm of breast: Secondary | ICD-10-CM | POA: Diagnosis not present

## 2016-12-18 DIAGNOSIS — M8589 Other specified disorders of bone density and structure, multiple sites: Secondary | ICD-10-CM | POA: Diagnosis not present

## 2016-12-18 DIAGNOSIS — M85831 Other specified disorders of bone density and structure, right forearm: Secondary | ICD-10-CM | POA: Insufficient documentation

## 2016-12-20 ENCOUNTER — Telehealth: Payer: Self-pay | Admitting: Family Medicine

## 2016-12-20 NOTE — Telephone Encounter (Signed)
Received her bone density scan-  Compared to Frax from 2015  FRAX: Based on the [place], the 10 year probability of a major osteoporotic fracture is 19%. The 10 year probability of a hip fracture is 2.8%  She still does not meet criterea to start bisphosphonate therapy  Dg Bone Density  Result Date: 12/18/2016 EXAM: DUAL X-RAY ABSORPTIOMETRY (DXA) FOR BONE MINERAL DENSITY IMPRESSION: Referring Physician:  Darreld Mclean PATIENT: Name: Tanya Harris, Tanya Harris Patient ID: [id number] Birth Date: [birth year] Height: 61.0 in. Sex: Female Measured: 12/18/2016 Weight: 161.0 lbs. Indications: Advanced Age, Caucasian, Estrogen Deficiency, History of Fracture (Adult), History of Osteopenia, Post Menopausal Fractures: Hand, Wrist Treatments: Calcium, Multivitamin, Vitamin D BBM ASSESSMENT: The BMD measured at Femur Total Left is 0.754 g/cm2 with a T-score of -2.0. This patient is considered osteopenic according to [facility]) criteria. Lumbar spine was not utilized due to advanced degenerative changes. Site Region Measured Date Measured Age WHO YA BMD Classification T-score DualFemur Total Left 12/18/2016 82.2 years Osteopenia -2.0 0.754 g/cm2 Right Forearm Radius 33% 12/18/2016 82.2 Osteopenia -1.9 0.707 g/cm2 World Health Organization [facility]) criteria for post-menopausal, Caucasian Women: Normal       T-score at or above -1 SD Osteopenia   T-score between -1 and -2.5 SD Osteoporosis T-score at or below -2.5 SD RECOMMENDATION: [place] recommends that FDA-approved medical therapies be considered in postmenopausal women and men age 38 or older with a: 1. Hip or vertebral (clinical or morphometric) fracture. 2. T-score of < -2.5 at the spine or hip. 3. Ten-year fracture probability by FRAX of 3% or greater for hip fracture or 20% or greater for major osteoporotic fracture. All treatment decisions require clinical judgment and consideration of individual  patient factors, including patient preferences, co-morbidities, previous drug use, risk factors not captured in the FRAX model (e.g. falls, vitamin D deficiency, increased bone turnover, interval significant decline in bone density) and possible under - or over-estimation of fracture risk by FRAX. All patients should ensure an adequate intake of dietary calcium (1200 mg/d) and vitamin D (800 IU daily) unless contraindicated. FOLLOW-UP: People with diagnosed cases of osteoporosis or at high risk for fracture should have regular bone mineral density tests. For patients eligible for Medicare, routine testing is allowed once every 2 years. The testing frequency can be increased to one year for patients who have rapidly progressing disease, those who are receiving or discontinuing medical therapy to restore bone mass, or have additional risk factors. I have reviewed this report and agree with the above findings. [facility] Patient: Tanya Harris   Referring Physician: Darreld Mclean Birth Date: October 26, 1932 [age] Patient ID: [id number] Height: 61.0 in. Weight: 161.0 lbs. Measured: 12/18/2016 1:05:13 PM (16 SP 2) Sex: Female Ethnicity: White Analyzed: 12/18/2016 1:13:57 PM (16 SP 2) FRAX* 10-year Probability of Fracture Based on femoral neck BMD: DualFemur (Left) Major Osteoporotic Fracture: 15.1% Hip Fracture:                2.5% Population:                  [place] (Caucasian) Risk Factors:                History of Fracture (Adult) *FRAX is a [employer] for Metabolic Bone Disease, a World Pharmacologist (WHO) [facility]. ASSESSMENT: The probability of a major osteoporotic fracture is 15.1% within the  next ten years. The probability of a hip fracture is 2.5% within the next ten years. Electronically Signed   By: Lowella Grip III M.D.   On: 12/18/2016 13:34   Mm Digital Screening Bilateral  Result Date:  12/18/2016 CLINICAL DATA:  Screening. EXAM: DIGITAL SCREENING BILATERAL MAMMOGRAM WITH CAD COMPARISON:  Previous exam(s). ACR Breast Density Category b: There are scattered areas of fibroglandular density. FINDINGS: There are no findings suspicious for malignancy. Images were processed with CAD. IMPRESSION: No mammographic evidence of malignancy. A result letter of this screening mammogram will be mailed directly to the patient. RECOMMENDATION: Screening mammogram in one year. (Code:SM-B-01Y) BI-RADS CATEGORY  1: Negative. Electronically Signed   By: Franki Cabot M.D.   On: 12/18/2016 13:18

## 2016-12-31 DIAGNOSIS — H52223 Regular astigmatism, bilateral: Secondary | ICD-10-CM | POA: Diagnosis not present

## 2016-12-31 DIAGNOSIS — Z961 Presence of intraocular lens: Secondary | ICD-10-CM | POA: Diagnosis not present

## 2016-12-31 DIAGNOSIS — H02824 Cysts of left upper eyelid: Secondary | ICD-10-CM | POA: Diagnosis not present

## 2016-12-31 DIAGNOSIS — H04123 Dry eye syndrome of bilateral lacrimal glands: Secondary | ICD-10-CM | POA: Diagnosis not present

## 2016-12-31 DIAGNOSIS — H524 Presbyopia: Secondary | ICD-10-CM | POA: Diagnosis not present

## 2017-05-03 DIAGNOSIS — W19XXXA Unspecified fall, initial encounter: Secondary | ICD-10-CM | POA: Insufficient documentation

## 2017-05-03 DIAGNOSIS — Y92009 Unspecified place in unspecified non-institutional (private) residence as the place of occurrence of the external cause: Secondary | ICD-10-CM

## 2017-05-08 ENCOUNTER — Encounter: Payer: Self-pay | Admitting: Cardiology

## 2017-05-10 NOTE — Progress Notes (Deleted)
Referring-Jessica C Copland MD Reason for referral-  HPI: 84 yo female for evaluation of at request of Gay Filler Copland MD.  Current Outpatient Medications  Medication Sig Dispense Refill  . aspirin 81 MG tablet Take 81 mg by mouth daily.    . Calcium Carbonate-Vitamin D (CALCIUM 600+D) 600-400 MG-UNIT per tablet Take 2 tablets by mouth daily.    . Fluocinolone Acetonide 0.01 % OIL Apply to scalp as needed for dryness- leave on at least 4 hours and then wash 60 mL 1  . fluticasone (FLONASE) 50 MCG/ACT nasal spray Place 2 sprays into both nostrils daily. 16 g 5  . Fluticasone-Salmeterol (ADVAIR) 250-50 MCG/DOSE AEPB Inhale 1 puff into the lungs 2 (two) times daily. 60 each 5  . glucosamine-chondroitin 500-400 MG tablet Take 2 tablets by mouth daily.    Marland Kitchen guaiFENesin (MUCINEX) 600 MG 12 hr tablet Take 600 mg by mouth daily as needed.     . meloxicam (MOBIC) 15 MG tablet Take 1 tablet (15 mg total) by mouth daily as needed for pain. 30 tablet 1  . metroNIDAZOLE (METROCREAM) 0.75 % cream Apply 1 application topically 2 (two) times daily as needed.     . Multiple Vitamin (MULTIVITAMIN) tablet Take 1 tablet by mouth daily.    Marland Kitchen omega-3 fish oil (MAXEPA) 1000 MG CAPS capsule Take 2 capsules by mouth daily. TAKING 1200mg  DAILY     No current facility-administered medications for this visit.     Allergies  Allergen Reactions  . Amoxicillin Nausea And Vomiting  . Ibuprofen Swelling    Mouth Swelling- however pt confirms that she is able to take mobic with no problems 11/2016     Past Medical History:  Diagnosis Date  . COPD (chronic obstructive pulmonary disease) (HCC)    Mild  . DJD (degenerative joint disease) of knee   . Scoliosis   . Spinal stenosis     Past Surgical History:  Procedure Laterality Date  . APPENDECTOMY  2005  . CATARACT EXTRACTION     Bilateral  . NASAL SINUS SURGERY    . TONSILLECTOMY    . TOTAL KNEE ARTHROPLASTY  2001   Left    Social History    Socioeconomic History  . Marital status: Married    Spouse name: Not on file  . Number of children: Not on file  . Years of education: Not on file  . Highest education level: Not on file  Social Needs  . Financial resource strain: Not on file  . Food insecurity - worry: Not on file  . Food insecurity - inability: Not on file  . Transportation needs - medical: Not on file  . Transportation needs - non-medical: Not on file  Occupational History  . Not on file  Tobacco Use  . Smoking status: Never Smoker  . Smokeless tobacco: Never Used  Substance and Sexual Activity  . Alcohol use: No  . Drug use: Yes  . Sexual activity: Not on file  Other Topics Concern  . Not on file  Social History Narrative  . Not on file    Family History  Problem Relation Age of Onset  . Heart disease Mother 43       Deceased  . Diabetes Mother   . Heart disease Father 109       Deceased  . Asthma Father   . Heart disease Maternal Uncle   . Colon cancer Brother   . Diabetes Brother        #  2  . Heart disease Brother        x2  . Healthy Sister   . Healthy Brother        x1  . Hyperlipidemia Son        x2    ROS: no fevers or chills, productive cough, hemoptysis, dysphasia, odynophagia, melena, hematochezia, dysuria, hematuria, rash, seizure activity, orthopnea, PND, pedal edema, claudication. Remaining systems are negative.  Physical Exam:   There were no vitals taken for this visit.  General:  Well developed/well nourished in NAD Skin warm/dry Patient not depressed No peripheral clubbing Back-normal HEENT-normal/normal eyelids Neck supple/normal carotid upstroke bilaterally; no bruits; no JVD; no thyromegaly chest - CTA/ normal expansion CV - RRR/normal S1 and S2; no murmurs, rubs or gallops;  PMI nondisplaced Abdomen -NT/ND, no HSM, no mass, + bowel sounds, no bruit 2+ femoral pulses, no bruits Ext-no edema, chords, 2+ DP Neuro-grossly nonfocal  ECG - personally  reviewed  A/P  1  Kirk Ruths, MD

## 2017-05-13 ENCOUNTER — Ambulatory Visit: Payer: Self-pay | Admitting: *Deleted

## 2017-05-13 NOTE — Telephone Encounter (Signed)
Pt called about ongoing numbness, tingling, and burning sensation in her feet; this has been going on for several years; the pt states that her conversation with her husband's cardiologist causes her to wonder if she has peripheral vascular disease; pt offered and accepted appointment with Dr Janett Billow Copland at [facility] on Thursday 05/16/17 at 1100; pt verbalizes understanding.

## 2017-05-15 ENCOUNTER — Ambulatory Visit: Payer: PRIVATE HEALTH INSURANCE | Admitting: Cardiology

## 2017-05-15 NOTE — Progress Notes (Addendum)
[place] at [facility] [address] [place], [place] [address] [phone]  Date:  05/16/2017   Name:  Tanya Harris   DOB:  October 06, 1932   MRN:  [id number]  PCP:  Darreld Mclean, MD    Chief Complaint: Foot Pain (c/o numbness, tingling and burning sensation in both feet, worse at night and in the left foot. Pt states this has been going on for 4-5 years but has worsened over the past few months. )   History of Present Illness:  Tanya Harris is a 84 y.o. very pleasant female patient who presents with the following:  I last saw her in October of 18:  She does have some joint pains-she feels like meloxicam really helps her.  However she is afraid that she will become "addicted" to this medication so it only taking it once a week.  Tylenol does not seem to help her She does use a walker for getting around, and she does also use a cane as needed She did go to ortho a few years ago- however this was not in [place].  At that time she was told that an injection for her spine would not be helpful as she was not having any pain  She does have COPD and also allergies.  She has "mild" COPD and uses advair She walks around the gym for 20 minutes 5 days a week, and also does some water exercises She lives alone in her house She goes to the sports center for her exercise  She notes a tingling and burning sensation in her B feet- this has been going on for several years, worse the last few months. She is not aware of any cause of worsening sx  Her husband is seeing Dr. Stanford Breed - she asked him about her sx, he did not think that she likely had claudication but suggested checking ABI to be sure  Both feet are problematic, but the left is worse than the right  She does do water aerobics 5x a week at the sports center.   She may get some cramping at night but does not sounds like she has claudication. She does not generally have any sx with  exercise She will have some "throbbing" in her left lateral ankle/ lower calf which she just noted for a few days now  She feels "like it follows my blood vessel" down the lateral leg Never had a DVT No sx in her hands - no numbness or tingling  She needs a refill of her mobic which she uses for OA pain No history of DM  Lab Results  Component Value Date   HGBA1C 5.6 11/25/2013     Patient Active Problem List   Diagnosis Date Noted  . Lesion of skin of breast 12/12/2015  . Coccygeal contusion 05/02/2015  . Screening, ischemic heart disease 12/08/2014  . Hearing loss 12/08/2014  . Hyperlipidemia 06/04/2014  . Medicare annual wellness visit, subsequent 11/30/2013  . COPD with chronic bronchitis ([place]) 11/02/2013  . Osteopenia 11/02/2013  . Gait instability 11/02/2013    Past Medical History:  Diagnosis Date  . COPD (chronic obstructive pulmonary disease) (HCC)    Mild  . DJD (degenerative joint disease) of knee   . Scoliosis   . Spinal stenosis     Past Surgical History:  Procedure Laterality Date  . APPENDECTOMY  2005  . CATARACT EXTRACTION     Bilateral  . NASAL SINUS  SURGERY    . TONSILLECTOMY    . TOTAL KNEE ARTHROPLASTY  2001   Left    Social History   Tobacco Use  . Smoking status: Never Smoker  . Smokeless tobacco: Never Used  Substance Use Topics  . Alcohol use: No  . Drug use: Yes    Family History  Problem Relation Age of Onset  . Heart disease Mother 39       Deceased  . Diabetes Mother   . Heart disease Father 22       Deceased  . Asthma Father   . Heart disease Maternal Uncle   . Colon cancer Brother   . Diabetes Brother        #2  . Heart disease Brother        x2  . Healthy Sister   . Healthy Brother        x1  . Hyperlipidemia Son        x2    Allergies  Allergen Reactions  . Amoxicillin Nausea And Vomiting  . Ibuprofen Swelling    Mouth Swelling- however pt confirms that she is able to take mobic with no problems  11/2016     Medication list has been reviewed and updated.  Current Outpatient Medications on File Prior to Visit  Medication Sig Dispense Refill  . aspirin 81 MG tablet Take 81 mg by mouth daily.    . Calcium Carbonate-Vitamin D (CALCIUM 600+D) 600-400 MG-UNIT per tablet Take 2 tablets by mouth daily.    . fluticasone (FLONASE) 50 MCG/ACT nasal spray Place 2 sprays into both nostrils daily. 16 g 5  . Fluticasone-Salmeterol (ADVAIR) 250-50 MCG/DOSE AEPB Inhale 1 puff into the lungs 2 (two) times daily. 60 each 5  . glucosamine-chondroitin 500-400 MG tablet Take 1 tablet by mouth daily.     Marland Kitchen guaiFENesin (MUCINEX) 600 MG 12 hr tablet Take 600 mg by mouth daily as needed.     . meloxicam (MOBIC) 15 MG tablet Take 1 tablet (15 mg total) by mouth daily as needed for pain. 30 tablet 1  . Multiple Vitamin (MULTIVITAMIN) tablet Take 1 tablet by mouth daily.    Marland Kitchen omega-3 fish oil (MAXEPA) 1000 MG CAPS capsule Take 2 capsules by mouth daily. TAKING 1200mg  DAILY     No current facility-administered medications on file prior to visit.     Review of Systems:  As per HPI- otherwise negative.   Physical Examination: Vitals:   05/16/17 1054  BP: 122/78  Pulse: 74  Temp: 97.9 F (36.6 C)  SpO2: 98%   Vitals:   05/16/17 1054  Weight: 163 lb (73.9 kg)  Height: 5' 1.5" (1.562 m)   Body mass index is 30.3 kg/m. Ideal Body Weight: Weight in (lb) to have BMI = 25: 134.2  GEN: WDWN, NAD, Non-toxic, A & O x 3, looks well  HEENT: Atraumatic, Normocephalic. Neck supple. No masses, No LAD. Ears and Nose: No external deformity. CV: RRR, No M/G/R. No JVD. No thrill. No extra heart sounds. PULM: CTA B, no wheezes, crackles, rhonchi. No retractions. No resp. distress. No accessory muscle use. EXTR: No c/c/e NEURO Normal gait for pt- uses a walker PSYCH: Normally interactive. Conversant. Not depressed or anxious appearing.  Calm demeanor.  Feet: normal DP pulses bilaterally, right is stronger.   Normal monofilament testing.  Loss of plantar fat pad but otherwise feet look good. They are hairless, legs hairless as well.  Nor cords or swelling palpated in the calves  Assessment and Plan: Neuropathy - Plan: Folate, B12, gabapentin (NEURONTIN) 100 MG capsule  Numbness and tingling of foot - Plan: VAS [place] ABI WITH/WO TBI, Basic metabolic panel, CBC, gabapentin (NEURONTIN) 100 MG capsule  Paresthesia of skin - Plan: Folate, B12, gabapentin (NEURONTIN) 100 MG capsule  Primary osteoarthritis, unspecified site - Plan: meloxicam (MOBIC) 15 MG tablet  Pain in left leg - Plan: US Venous Img Lower Unilateral Left  Likely peripheral neuropathy - will obtain ABI to ensure no sign of PVD.  B12 and folate pending. Check glucose on BMP- no evidence of hyperglycemia to cover a1c testing Will start her on a gradual taper of Neurontin.  She will let me know how this goes for her, and I will follow-up with her pending her labs/ ABI  Signed Lamar Blinks, MD  US Venous Img Lower Unilateral Left  Result Date: 05/16/2017 CLINICAL DATA:  Left lower extremity pain for the past several days. History of left total knee replacement. Evaluate for DVT. EXAM: LEFT LOWER EXTREMITY VENOUS DOPPLER ULTRASOUND TECHNIQUE: Gray-scale sonography with graded compression, as well as color Doppler and duplex ultrasound were performed to evaluate the lower extremity deep venous systems from the level of the common femoral vein and including the common femoral, femoral, profunda femoral, popliteal and calf veins including the posterior tibial, peroneal and gastrocnemius veins when visible. The superficial great saphenous vein was also interrogated. Spectral Doppler was utilized to evaluate flow at rest and with distal augmentation maneuvers in the common femoral, femoral and popliteal veins. COMPARISON:  None. FINDINGS: Contralateral Common Femoral Vein: Respiratory phasicity is normal and symmetric with the symptomatic side.  No evidence of thrombus. Normal compressibility. Common Femoral Vein: No evidence of thrombus. Normal compressibility, respiratory phasicity and response to augmentation. Saphenofemoral Junction: No evidence of thrombus. Normal compressibility and flow on color Doppler imaging. Profunda Femoral Vein: No evidence of thrombus. Normal compressibility and flow on color Doppler imaging. Femoral Vein: No evidence of thrombus. Normal compressibility, respiratory phasicity and response to augmentation. Popliteal Vein: No evidence of thrombus. Normal compressibility, respiratory phasicity and response to augmentation. Calf Veins: No evidence of thrombus. Normal compressibility and flow on color Doppler imaging. Superficial Great Saphenous Vein: No evidence of thrombus. Normal compressibility. Venous Reflux:  None. Other Findings:  None. IMPRESSION: No evidence of DVT within the left lower extremity. Electronically Signed   By: Sandi Mariscal M.D.   On: 05/16/2017 16:02   Called 3/14 and alerted pt no DVT Received her labs 3/15  Results for orders placed or performed in visit on 05/16/17  Folate  Result Value Ref Range   Folate >23.6 >5.9 ng/mL  B12  Result Value Ref Range   Vitamin B-12 671 211 - 911 pg/mL  Basic metabolic panel  Result Value Ref Range   Sodium 135 135 - 145 mEq/L   Potassium 4.0 3.5 - 5.1 mEq/L   Chloride 100 96 - 112 mEq/L   CO2 31 19 - 32 mEq/L   Glucose, Bld 96 70 - 99 mg/dL   BUN 19 6 - 23 mg/dL   Creatinine, Ser 0.56 0.40 - 1.20 mg/dL   Calcium 9.9 8.4 - 10.5 mg/dL   GFR 109.98 >60.00 mL/min  CBC  Result Value Ref Range   WBC 7.7 4.0 - 10.5 K/uL   RBC 4.66 3.87 - 5.11 Mil/uL   Platelets 276.0 150.0 - 400.0 K/uL   Hemoglobin 14.1 12.0 - 15.0 g/dL   HCT 42.2 36.0 - 46.0 %   MCV 90.4  78.0 - 100.0 fl   MCHC 33.5 30.0 - 36.0 g/dL   RDW 13.9 11.5 - 15.5 %   Message to pt Your labs all look fine- B12 and folate levels are normal Please let me know how the gabapentin works for  you, and I'll look for your ABI (arterial circulation) test results

## 2017-05-16 ENCOUNTER — Encounter: Payer: Self-pay | Admitting: Family Medicine

## 2017-05-16 ENCOUNTER — Ambulatory Visit (INDEPENDENT_AMBULATORY_CARE_PROVIDER_SITE_OTHER): Payer: Medicare Other | Admitting: Family Medicine

## 2017-05-16 ENCOUNTER — Ambulatory Visit (HOSPITAL_BASED_OUTPATIENT_CLINIC_OR_DEPARTMENT_OTHER)
Admission: RE | Admit: 2017-05-16 | Discharge: 2017-05-16 | Disposition: A | Discharge status: Home / Self Care | Payer: Medicare Other | Source: Ambulatory Visit | Attending: Family Medicine | Admitting: Family Medicine

## 2017-05-16 VITALS — BP 122/78 | HR 74 | Temp 97.9°F | Ht 61.5 in | Wt 163.0 lb

## 2017-05-16 DIAGNOSIS — R202 Paresthesia of skin: Secondary | ICD-10-CM

## 2017-05-16 DIAGNOSIS — M1991 Primary osteoarthritis, unspecified site: Secondary | ICD-10-CM

## 2017-05-16 DIAGNOSIS — G629 Polyneuropathy, unspecified: Secondary | ICD-10-CM | POA: Diagnosis not present

## 2017-05-16 DIAGNOSIS — M79605 Pain in left leg: Secondary | ICD-10-CM | POA: Diagnosis not present

## 2017-05-16 DIAGNOSIS — R200 Anesthesia of skin: Secondary | ICD-10-CM | POA: Diagnosis not present

## 2017-05-16 DIAGNOSIS — M79662 Pain in left lower leg: Secondary | ICD-10-CM | POA: Diagnosis not present

## 2017-05-16 LAB — CBC
HCT: 42.2 % (ref 36.0–46.0)
Hemoglobin: 14.1 g/dL (ref 12.0–15.0)
MCHC: 33.5 g/dL (ref 30.0–36.0)
MCV: 90.4 fl (ref 78.0–100.0)
Platelets: 276 10*3/uL (ref 150.0–400.0)
RBC: 4.66 Mil/uL (ref 3.87–5.11)
RDW: 13.9 % (ref 11.5–15.5)
WBC: 7.7 10*3/uL (ref 4.0–10.5)

## 2017-05-16 LAB — BASIC METABOLIC PANEL
BUN: 19 mg/dL (ref 6–23)
CALCIUM: 9.9 mg/dL (ref 8.4–10.5)
CO2: 31 meq/L (ref 19–32)
CREATININE: 0.56 mg/dL (ref 0.40–1.20)
Chloride: 100 mEq/L (ref 96–112)
GFR: 109.98 mL/min (ref >60.00)
Glucose, Bld: 96 mg/dL (ref 70–99)
Potassium: 4 mEq/L (ref 3.5–5.1)
SODIUM: 135 meq/L (ref 135–145)

## 2017-05-16 LAB — FOLATE: Folate: >23.6 ng/mL (ref >5.9)

## 2017-05-16 LAB — VITAMIN B12: VITAMIN B 12: 671 pg/mL (ref 211–911)

## 2017-05-16 MED ORDER — GABAPENTIN 100 MG PO CAPS: 100.0000 mg | ORAL_CAPSULE | Freq: Three times a day (TID) | ORAL | 3 refills | Status: DC

## 2017-05-16 MED ORDER — MELOXICAM 15 MG PO TABS: 15.0000 mg | ORAL_TABLET | Freq: Every day | ORAL | 1 refills | Status: DC | PRN

## 2017-05-16 NOTE — Patient Instructions (Addendum)
I am going to do a few labs to look for any cause of your numbness, and we will set up "ABI" testing to make sure you have good arterial circulation to your feet  Let's have you try gabapentin 100 mg for your pain- we will titrate up your dosage slowly Start with 100 mg at bedtime.  The next day you can take it am and pm, and then the next day 3 times a day.    I also refilled your meloxicam today

## 2017-05-17 ENCOUNTER — Encounter: Payer: Self-pay | Admitting: Family Medicine

## 2017-05-30 ENCOUNTER — Ambulatory Visit (HOSPITAL_COMMUNITY)
Admission: RE | Admit: 2017-05-30 | Discharge: 2017-05-30 | Disposition: A | Discharge status: Home / Self Care | Payer: Medicare Other | Source: Ambulatory Visit | Attending: Family Medicine | Admitting: Family Medicine

## 2017-05-30 DIAGNOSIS — R202 Paresthesia of skin: Secondary | ICD-10-CM | POA: Diagnosis not present

## 2017-05-30 DIAGNOSIS — R200 Anesthesia of skin: Secondary | ICD-10-CM

## 2017-05-31 ENCOUNTER — Other Ambulatory Visit: Payer: Self-pay | Admitting: Family Medicine

## 2017-06-01 ENCOUNTER — Encounter: Payer: Self-pay | Admitting: Family Medicine

## 2017-06-25 ENCOUNTER — Other Ambulatory Visit: Payer: Self-pay | Admitting: Family Medicine

## 2017-10-01 ENCOUNTER — Ambulatory Visit (INDEPENDENT_AMBULATORY_CARE_PROVIDER_SITE_OTHER): Payer: Medicare Other | Admitting: Family Medicine

## 2017-10-01 ENCOUNTER — Encounter: Payer: Self-pay | Admitting: Family Medicine

## 2017-10-01 ENCOUNTER — Ambulatory Visit (HOSPITAL_BASED_OUTPATIENT_CLINIC_OR_DEPARTMENT_OTHER)
Admission: RE | Admit: 2017-10-01 | Discharge: 2017-10-01 | Disposition: A | Discharge status: Home / Self Care | Payer: Medicare Other | Source: Ambulatory Visit | Attending: Family Medicine | Admitting: Family Medicine

## 2017-10-01 VITALS — BP 156/80 | HR 68 | Ht 61.0 in | Wt 158.0 lb

## 2017-10-01 VITALS — BP 140/47 | HR 74 | Temp 98.0°F | Ht 61.5 in | Wt 158.8 lb

## 2017-10-01 DIAGNOSIS — M25562 Pain in left knee: Secondary | ICD-10-CM

## 2017-10-01 DIAGNOSIS — G8929 Other chronic pain: Secondary | ICD-10-CM | POA: Insufficient documentation

## 2017-10-01 DIAGNOSIS — J302 Other seasonal allergic rhinitis: Secondary | ICD-10-CM | POA: Diagnosis not present

## 2017-10-01 DIAGNOSIS — M25862 Other specified joint disorders, left knee: Secondary | ICD-10-CM | POA: Insufficient documentation

## 2017-10-01 DIAGNOSIS — M1991 Primary osteoarthritis, unspecified site: Secondary | ICD-10-CM

## 2017-10-01 MED ORDER — MELOXICAM 15 MG PO TABS: 15.0000 mg | ORAL_TABLET | Freq: Every day | ORAL | 1 refills | Status: DC | PRN

## 2017-10-01 MED ORDER — FLUTICASONE PROPIONATE 50 MCG/ACT NA SUSP: 2.0000 | Freq: Every day | NASAL | 5 refills | Status: DC

## 2017-10-01 NOTE — Progress Notes (Signed)
Patient ID: Tanya Harris, female    DOB: 01-Mar-1933  Age: 84 y.o. MRN: [id number]    Subjective:  Subjective  HPI Tanya Harris presents for c/o L knee pain.  No known injury.  She had a knee replacement 18 years ago and did great but recently it has started to bother her.  Her meloxicam is not helping.  She walks with a walker usually due to DDD in spine.     Review of Systems  Constitutional: Negative for activity change, appetite change, fatigue and unexpected weight change.  Respiratory: Negative for cough and shortness of breath.   Cardiovascular: Negative for chest pain and palpitations.  Musculoskeletal: Positive for arthralgias and gait problem.  Psychiatric/Behavioral: Negative for behavioral problems and dysphoric mood. The patient is not nervous/anxious.     History Past Medical History:  Diagnosis Date  . COPD (chronic obstructive pulmonary disease) (HCC)    Mild  . DJD (degenerative joint disease) of knee   . Scoliosis   . Spinal stenosis     She has a past surgical history that includes Total knee arthroplasty (2001); Appendectomy (2005); Cataract extraction; Nasal sinus surgery; and Tonsillectomy.   Her family history includes Asthma in her father; Colon cancer in her brother; Diabetes in her brother and mother; Healthy in her brother and sister; Heart disease in her brother and maternal uncle; Heart disease (age of onset: 80) in her mother; Heart disease (age of onset: 32) in her father; Hyperlipidemia in her son.She reports that she has never smoked. She has never used smokeless tobacco. She reports that she has current or past drug history. She reports that she does not drink alcohol.  Current Outpatient Medications on File Prior to Visit  Medication Sig Dispense Refill  . aspirin 81 MG tablet Take 81 mg by mouth daily.    . Calcium Carbonate-Vitamin D (CALCIUM 600+D) 600-400 MG-UNIT per tablet Take 2 tablets by mouth daily.    . Fluticasone-Salmeterol (ADVAIR)  250-50 MCG/DOSE AEPB Inhale 1 puff into the lungs 2 (two) times daily. 60 each 5  . glucosamine-chondroitin 500-400 MG tablet Take 1 tablet by mouth daily.     Marland Kitchen guaiFENesin (MUCINEX) 600 MG 12 hr tablet Take 600 mg by mouth daily as needed.     . Multiple Vitamin (MULTIVITAMIN) tablet Take 1 tablet by mouth daily.    Marland Kitchen omega-3 fish oil (MAXEPA) 1000 MG CAPS capsule Take 2 capsules by mouth daily. TAKING 1200mg  DAILY     No current facility-administered medications on file prior to visit.      Objective:  Objective  Physical Exam  Constitutional: She is oriented to person, place, and time. She appears well-developed and well-nourished.  HENT:  Head: Normocephalic and atraumatic.  Neck: Normal range of motion. Neck supple. No JVD present. Carotid bruit is not present. No thyromegaly present.  Musculoskeletal: She exhibits tenderness.       Left knee: She exhibits no swelling and no erythema. Tenderness found. Lateral joint line and patellar tendon tenderness noted.  Neurological: She is alert and oriented to person, place, and time.  Psychiatric: She has a normal mood and affect.  Nursing note and vitals reviewed.  BP (!) 140/47 (BP Location: Left Arm, Patient Position: Sitting, Cuff Size: Large)   Pulse 74   Temp 98 F (36.7 C) (Oral)   Ht 5' 1.5" (1.562 m)   Wt 158 lb 12.8 oz (72 kg)   SpO2 98%   BMI 29.52 kg/m  Wt Readings from Last  3 Encounters:  10/01/17 158 lb 12.8 oz (72 kg)  05/16/17 163 lb (73.9 kg)  12/17/16 160 lb (72.6 kg)     Lab Results  Component Value Date   WBC 7.7 05/16/2017   HGB 14.1 05/16/2017   HCT 42.2 05/16/2017   PLT 276.0 05/16/2017   GLUCOSE 96 05/16/2017   CHOL 229 (H) 12/17/2016   TRIG 103.0 12/17/2016   HDL 52.80 12/17/2016   LDLCALC 155 (H) 12/17/2016   ALT 14 12/17/2016   AST 25 12/17/2016   NA 135 05/16/2017   K 4.0 05/16/2017   CL 100 05/16/2017   CREATININE 0.56 05/16/2017   BUN 19 05/16/2017   CO2 31 05/16/2017   TSH 1.15  11/25/2013   HGBA1C 5.6 11/25/2013    No results found.   Assessment & Plan:  Plan  I have discontinued Orlena Argabright's gabapentin. I am also having her maintain her guaiFENesin, multivitamin, Calcium Carbonate-Vitamin D, glucosamine-chondroitin, aspirin, omega-3 fish oil, Fluticasone-Salmeterol, fluticasone, and meloxicam.  Meds ordered this encounter  Medications  . fluticasone (FLONASE) 50 MCG/ACT nasal spray    Sig: Place 2 sprays into both nostrils daily.    Dispense:  16 g    Refill:  5  . meloxicam (MOBIC) 15 MG tablet    Sig: Take 1 tablet (15 mg total) by mouth daily as needed for pain.    Dispense:  30 tablet    Refill:  1    Problem List Items Addressed This Visit    None    Visit Diagnoses    Seasonal allergies    -  Primary   Relevant Medications   fluticasone (FLONASE) 50 MCG/ACT nasal spray   Primary osteoarthritis, unspecified site       Relevant Medications   meloxicam (MOBIC) 15 MG tablet   Chronic pain of left knee       Relevant Medications   meloxicam (MOBIC) 15 MG tablet   Other Relevant Orders   Ambulatory referral to Sports Medicine      Follow-up: Return if symptoms worsen or fail to improve.  Ann Held, DO

## 2017-10-01 NOTE — Patient Instructions (Signed)

## 2017-10-01 NOTE — Assessment & Plan Note (Signed)
Hx knee replacement  No relief with meloxicam Pt to see sport med today

## 2017-10-01 NOTE — Patient Instructions (Signed)
Your x-rays and exam are reassuring. You either have a synovitis of the remaining synovium of your knee or have fat pad impingement in this area. Both are not concerning and should improve with conservative measures. Icing 15 minutes at a time at least 3-4 times a day. Tylenol 500mg  1-2 tabs three times a day as needed. Continue the topical medication you've been using up to 4 times a day. Consider salon pas patches, aspercreme topically if you're still struggling as well. Follow up with me in 1 month for reevaluation.

## 2017-10-02 ENCOUNTER — Encounter: Payer: Self-pay | Admitting: Family Medicine

## 2017-10-02 NOTE — Progress Notes (Signed)
PCP: Copland, Gay Filler, MD Consultation requested by:  Dr. Carollee Herter  Subjective:   HPI: Patient is a 84 y.o. female here for left knee pain.  Patient reports she had some pain anterior left knee about a month ago but improved. Then on Saturday she developed more severe pain anteriorly, worse with knee flexion. No swelling, fever, injury. Pain 5/10 but up to 10/10 and sharp at its worst. She has been icing all weekend which has helped and uses neutralife also. Had knee replacement in 2001 of this knee. No skin changes.  She has neuropathy of this lower leg but no change in numbness.  Past Medical History:  Diagnosis Date  . COPD (chronic obstructive pulmonary disease) (HCC)    Mild  . DJD (degenerative joint disease) of knee   . Scoliosis   . Spinal stenosis     Current Outpatient Medications on File Prior to Visit  Medication Sig Dispense Refill  . aspirin 81 MG tablet Take 81 mg by mouth daily.    . Calcium Carbonate-Vitamin D (CALCIUM 600+D) 600-400 MG-UNIT per tablet Take 2 tablets by mouth daily.    . fluticasone (FLONASE) 50 MCG/ACT nasal spray Place 2 sprays into both nostrils daily. 16 g 5  . Fluticasone-Salmeterol (ADVAIR) 250-50 MCG/DOSE AEPB Inhale 1 puff into the lungs 2 (two) times daily. 60 each 5  . glucosamine-chondroitin 500-400 MG tablet Take 1 tablet by mouth daily.     Marland Kitchen guaiFENesin (MUCINEX) 600 MG 12 hr tablet Take 600 mg by mouth daily as needed.     . meloxicam (MOBIC) 15 MG tablet Take 1 tablet (15 mg total) by mouth daily as needed for pain. 30 tablet 1  . Multiple Vitamin (MULTIVITAMIN) tablet Take 1 tablet by mouth daily.    Marland Kitchen omega-3 fish oil (MAXEPA) 1000 MG CAPS capsule Take 2 capsules by mouth daily. TAKING 1200mg  DAILY     No current facility-administered medications on file prior to visit.     Past Surgical History:  Procedure Laterality Date  . APPENDECTOMY  2005  . CATARACT EXTRACTION     Bilateral  . NASAL SINUS SURGERY    .  TONSILLECTOMY    . TOTAL KNEE ARTHROPLASTY  2001   Left    Allergies  Allergen Reactions  . Amoxicillin Nausea And Vomiting  . Ibuprofen Swelling    Mouth Swelling- however pt confirms that she is able to take mobic with no problems 11/2016     Social History   Socioeconomic History  . Marital status: Married    Spouse name: Not on file  . Number of children: Not on file  . Years of education: Not on file  . Highest education level: Not on file  Occupational History  . Not on file  Social Needs  . Financial resource strain: Not on file  . Food insecurity:    Worry: Not on file    Inability: Not on file  . Transportation needs:    Medical: Not on file    Non-medical: Not on file  Tobacco Use  . Smoking status: Never Smoker  . Smokeless tobacco: Never Used  Substance and Sexual Activity  . Alcohol use: No  . Drug use: Yes  . Sexual activity: Not on file  Lifestyle  . Physical activity:    Days per week: Not on file    Minutes per session: Not on file  . Stress: Not on file  Relationships  . Social connections:    Talks  on phone: Not on file    Gets together: Not on file    Attends religious service: Not on file    Active member of club or organization: Not on file    Attends meetings of clubs or organizations: Not on file    Relationship status: Not on file  . Intimate partner violence:    Fear of current or ex partner: Not on file    Emotionally abused: Not on file    Physically abused: Not on file    Forced sexual activity: Not on file  Other Topics Concern  . Not on file  Social History Narrative  . Not on file    Family History  Problem Relation Age of Onset  . Heart disease Mother 68       Deceased  . Diabetes Mother   . Heart disease Father 50       Deceased  . Asthma Father   . Heart disease Maternal Uncle   . Colon cancer Brother   . Diabetes Brother        #2  . Heart disease Brother        x2  . Healthy Sister   . Healthy Brother         x1  . Hyperlipidemia Son        x2    BP (!) 156/80   Pulse 68   Ht 5\' 1"  (1.549 m)   Wt 158 lb (71.7 kg)   BMI 29.85 kg/m   Review of Systems: See HPI above.     Objective:  Physical Exam:  Gen: NAD, comfortable in exam room  Left knee: Well healed TKR scar.  No effusion, other abnormalities, redness. Mild TTP medial and lateral to patellar tendon.  No other tenderness of knee.   FROM with 5/5 strength. Negative valgus/varus testing. Negative patellar apprehension. NV intact distally.  Right knee: No deformity. FROM with 5/5 strength. No tenderness to palpation. NVI distally.   Assessment & Plan:  1. Left knee pain - independently reviewed radiographs and no evidence loosening of hardware or other abnormalities.  Consistent with the either a trace amount of synovitis causing anterior knee pain or fat pad impingement in this area.  Reassured patient on both counts.  She should continue icing, take Tylenol as needed, use topical medicine.   Consider salon pas patches, Aspercreme.  Follow-up in 1 month.

## 2017-10-08 ENCOUNTER — Encounter: Payer: Self-pay | Admitting: Family Medicine

## 2017-10-08 ENCOUNTER — Ambulatory Visit (HOSPITAL_BASED_OUTPATIENT_CLINIC_OR_DEPARTMENT_OTHER)
Admission: RE | Admit: 2017-10-08 | Discharge: 2017-10-08 | Disposition: A | Discharge status: Home / Self Care | Payer: Medicare Other | Source: Ambulatory Visit | Attending: Family Medicine | Admitting: Family Medicine

## 2017-10-08 ENCOUNTER — Ambulatory Visit (INDEPENDENT_AMBULATORY_CARE_PROVIDER_SITE_OTHER): Payer: Medicare Other | Admitting: Family Medicine

## 2017-10-08 VITALS — BP 169/75 | HR 66 | Ht 62.0 in | Wt 158.0 lb

## 2017-10-08 DIAGNOSIS — M25552 Pain in left hip: Secondary | ICD-10-CM

## 2017-10-08 DIAGNOSIS — M25562 Pain in left knee: Secondary | ICD-10-CM

## 2017-10-08 DIAGNOSIS — G8929 Other chronic pain: Secondary | ICD-10-CM | POA: Diagnosis not present

## 2017-10-08 NOTE — Progress Notes (Signed)
PCP: Copland, Gay Filler, MD Consultation requested by:  Dr. Carollee Herter  Subjective:   HPI: Patient is a 84 y.o. female here for left knee pain.  7/30: Patient reports she had some pain anterior left knee about a month ago but improved. Then on Saturday she developed more severe pain anteriorly, worse with knee flexion. No swelling, fever, injury. Pain 5/10 but up to 10/10 and sharp at its worst. She has been icing all weekend which has helped and uses neutralife also. Had knee replacement in 2001 of this knee. No skin changes.  She has neuropathy of this lower leg but no change in numbness.  8/6: Patient returns stating she's struggling with left knee pain. She's taking tylenol (2 today so far), meloxicam, aspercreme, ice. Icing seems to help the best out of anything. Still with fairly severe 8/10 anterior left knee pain. No new injuries. No skin changes.  Past Medical History:  Diagnosis Date  . COPD (chronic obstructive pulmonary disease) (HCC)    Mild  . DJD (degenerative joint disease) of knee   . Scoliosis   . Spinal stenosis     Current Outpatient Medications on File Prior to Visit  Medication Sig Dispense Refill  . aspirin 81 MG tablet Take 81 mg by mouth daily.    . Calcium Carbonate-Vitamin D (CALCIUM 600+D) 600-400 MG-UNIT per tablet Take 2 tablets by mouth daily.    . fluticasone (FLONASE) 50 MCG/ACT nasal spray Place 2 sprays into both nostrils daily. 16 g 5  . Fluticasone-Salmeterol (ADVAIR) 250-50 MCG/DOSE AEPB Inhale 1 puff into the lungs 2 (two) times daily. 60 each 5  . glucosamine-chondroitin 500-400 MG tablet Take 1 tablet by mouth daily.     Marland Kitchen guaiFENesin (MUCINEX) 600 MG 12 hr tablet Take 600 mg by mouth daily as needed.     . meloxicam (MOBIC) 15 MG tablet Take 1 tablet (15 mg total) by mouth daily as needed for pain. 30 tablet 1  . Multiple Vitamin (MULTIVITAMIN) tablet Take 1 tablet by mouth daily.    Marland Kitchen omega-3 fish oil (MAXEPA) 1000 MG CAPS  capsule Take 2 capsules by mouth daily. TAKING 1200mg  DAILY     No current facility-administered medications on file prior to visit.     Past Surgical History:  Procedure Laterality Date  . APPENDECTOMY  2005  . CATARACT EXTRACTION     Bilateral  . NASAL SINUS SURGERY    . TONSILLECTOMY    . TOTAL KNEE ARTHROPLASTY  2001   Left    Allergies  Allergen Reactions  . Amoxicillin Nausea And Vomiting  . Ibuprofen Swelling    Mouth Swelling- however pt confirms that she is able to take mobic with no problems 11/2016     Social History   Socioeconomic History  . Marital status: Married    Spouse name: Not on file  . Number of children: Not on file  . Years of education: Not on file  . Highest education level: Not on file  Occupational History  . Not on file  Social Needs  . Financial resource strain: Not on file  . Food insecurity:    Worry: Not on file    Inability: Not on file  . Transportation needs:    Medical: Not on file    Non-medical: Not on file  Tobacco Use  . Smoking status: Never Smoker  . Smokeless tobacco: Never Used  Substance and Sexual Activity  . Alcohol use: No  . Drug use: Yes  .  Sexual activity: Not on file  Lifestyle  . Physical activity:    Days per week: Not on file    Minutes per session: Not on file  . Stress: Not on file  Relationships  . Social connections:    Talks on phone: Not on file    Gets together: Not on file    Attends religious service: Not on file    Active member of club or organization: Not on file    Attends meetings of clubs or organizations: Not on file    Relationship status: Not on file  . Intimate partner violence:    Fear of current or ex partner: Not on file    Emotionally abused: Not on file    Physically abused: Not on file    Forced sexual activity: Not on file  Other Topics Concern  . Not on file  Social History Narrative  . Not on file    Family History  Problem Relation Age of Onset  . Heart  disease Mother 20       Deceased  . Diabetes Mother   . Heart disease Father 66       Deceased  . Asthma Father   . Heart disease Maternal Uncle   . Colon cancer Brother   . Diabetes Brother        #2  . Heart disease Brother        x2  . Healthy Sister   . Healthy Brother        x1  . Hyperlipidemia Son        x2    BP (!) 169/75   Pulse 66   Ht 5\' 2"  (1.575 m)   Wt 158 lb (71.7 kg)   BMI 28.90 kg/m   Review of Systems: See HPI above.     Objective:  Physical Exam:  Gen: NAD, comfortable in exam room  Left knee: Well healed TKR scar.  No effusion, other abnormalities. Mild TTP medial > lateral to patellar tendon.  No other tenderness. FROM with 5/5 strength. Negative valgus/varus testing. Negative patellar apprehension. NV intact distally.   Left hip: Negative logroll, negative fadirs.  Assessment & Plan:  1. Left knee pain - Radiographs negative and without evidence of hardware loosening.  Still with persistent pain despite tylenol, meloxicam, aspercreme, icing.  Independently reviewed hip radiographs to assess for referred pain, possible AVN though exam is reassuring and no evidence hip pathology.  Will go ahead with CT of knee to assess for subtle loosening, bony pathology (unlikely).  We discussed the risks of trying an intraarticular cortisone injection (infection, having to remove hardware if this occurs, sepsis, IV antibiotics) and we usually defer to orthopedists if they want to try this but her surgeon is in [place].  We may revisit this depending on CT results.

## 2017-10-09 ENCOUNTER — Ambulatory Visit: Payer: PRIVATE HEALTH INSURANCE | Admitting: Family Medicine

## 2017-10-09 ENCOUNTER — Ambulatory Visit (HOSPITAL_BASED_OUTPATIENT_CLINIC_OR_DEPARTMENT_OTHER)
Admission: RE | Admit: 2017-10-09 | Discharge: 2017-10-09 | Disposition: A | Discharge status: Home / Self Care | Payer: Medicare Other | Source: Ambulatory Visit | Attending: Family Medicine | Admitting: Family Medicine

## 2017-10-09 DIAGNOSIS — G8929 Other chronic pain: Secondary | ICD-10-CM | POA: Diagnosis not present

## 2017-10-09 DIAGNOSIS — M25562 Pain in left knee: Secondary | ICD-10-CM | POA: Insufficient documentation

## 2017-10-09 DIAGNOSIS — Z96652 Presence of left artificial knee joint: Secondary | ICD-10-CM | POA: Insufficient documentation

## 2017-10-09 DIAGNOSIS — Z471 Aftercare following joint replacement surgery: Secondary | ICD-10-CM | POA: Diagnosis not present

## 2017-10-10 NOTE — Addendum Note (Signed)
Addended by: Sherrie George F on: 10/10/2017 08:23 AM   Modules accepted: Orders

## 2017-10-11 ENCOUNTER — Telehealth: Payer: Self-pay

## 2017-10-11 NOTE — Telephone Encounter (Signed)
Copied from [place] [id number]. Topic: Appointment Scheduling - Scheduling Inquiry for Clinic >> Oct 11, 2017  7:23 AM Scherrie Gerlach wrote: Reason for CRM: pt would like Dr Lorelei Pont to work her in on Monday.  Pt doesn't want to see anyone else, she did that, and doesn't feel she got the attention to her knee that she needed. Pt wants Dr Lorelei Pont to know she is also having dry mouth. Pt declined to see anyone else today

## 2017-10-11 NOTE — Telephone Encounter (Signed)
Called patient and explained to her Dr. Lorelei Pont is at her max on Monday and will be unable to see her on Monday. Patient has been scheduled for appointment on Wednesday to see PCP.

## 2017-10-15 NOTE — Progress Notes (Signed)
[facility], [place], [place] [address] [phone]  Date:  10/16/2017   Name:  Tanya Harris   DOB:  December 12, 1932   MRN:  [id number]  PCP:  Darreld Mclean, MD    Chief Complaint: Knee Pain (left knee, no known injury, has had xray and ct saw sports med ) and Neuropathy (worsening, not taking gabapentin)   History of Present Illness:  Tanya Harris is a 84 y.o. very pleasant female patient who presents with the following:  Here today with concern of knee pain  I saw her in March for her neuropathy She was just in to see Karlton Lemon with sports med last week He ordered a CT of her knee on 8/7 as follows IMPRESSION: 1. Left total knee arthroplasty without hardware failure or complication. No acute osseous injury of the left knee  She had her left knee replaced in [place] in 2001- it really did well until recently She has had left knee pain for about 2.5 weeks- she is not aware of any injury or change in her routine that could have triggered this She also notes that her neuropathy is present in both feet and seems to be up to the level of her knee  She has an rx for gabapentin but it not taking this as it did not sem to help and caused SE  She has noted sx of neuropathy for the last few years  Present in both feet  BP Readings from Last 3 Encounters:  10/16/17 118/70  10/08/17 (!) 169/75  10/01/17 (!) 156/80   She went to PT today but she did not have a treatment yet- just did records and treatment plan  She would like a refill of the clindamycin that she uses prior to dental work- she was given this after her knee replacement  Patient Active Problem List   Diagnosis Date Noted  . Chronic pain of left knee 10/01/2017  . Lesion of skin of breast 12/12/2015  . Coccygeal contusion 05/02/2015  . Screening, ischemic heart disease 12/08/2014  . Hearing loss 12/08/2014  . Hyperlipidemia  06/04/2014  . Arthritis 04/15/2014  . Chronic allergic rhinitis 04/15/2014  . COPD (chronic obstructive pulmonary disease) ([place]) 04/15/2014  . Medicare annual wellness visit, subsequent 11/30/2013  . COPD with chronic bronchitis ([place]) 11/02/2013  . Osteopenia 11/02/2013  . Gait instability 11/02/2013    Past Medical History:  Diagnosis Date  . COPD (chronic obstructive pulmonary disease) (HCC)    Mild  . DJD (degenerative joint disease) of knee   . Scoliosis   . Spinal stenosis     Past Surgical History:  Procedure Laterality Date  . APPENDECTOMY  2005  . CATARACT EXTRACTION     Bilateral  . NASAL SINUS SURGERY    . TONSILLECTOMY    . TOTAL KNEE ARTHROPLASTY  2001   Left    Social History   Tobacco Use  . Smoking status: Never Smoker  . Smokeless tobacco: Never Used  Substance Use Topics  . Alcohol use: No  . Drug use: Yes    Family History  Problem Relation Age of Onset  . Heart disease Mother 85       Deceased  . Diabetes Mother   . Heart disease Father 73       Deceased  . Asthma Father   . Heart disease Maternal Uncle   . Colon cancer Brother   .  Diabetes Brother        #2  . Heart disease Brother        x2  . Healthy Sister   . Healthy Brother        x1  . Hyperlipidemia Son        x2    Allergies  Allergen Reactions  . Amoxicillin Nausea And Vomiting  . Ibuprofen Swelling    Mouth Swelling- however pt confirms that she is able to take mobic with no problems 11/2016     Medication list has been reviewed and updated.  Current Outpatient Medications on File Prior to Visit  Medication Sig Dispense Refill  . aspirin 81 MG tablet Take 81 mg by mouth daily.    . Calcium Carbonate-Vitamin D (CALCIUM 600+D) 600-400 MG-UNIT per tablet Take 2 tablets by mouth daily.    . fluticasone (FLONASE) 50 MCG/ACT nasal spray Place 2 sprays into both nostrils daily. 16 g 5  . Fluticasone-Salmeterol (ADVAIR) 250-50 MCG/DOSE AEPB Inhale 1 puff into the lungs 2  (two) times daily. 60 each 5  . glucosamine-chondroitin 500-400 MG tablet Take 1 tablet by mouth daily.     . meloxicam (MOBIC) 15 MG tablet Take 1 tablet (15 mg total) by mouth daily as needed for pain. 30 tablet 1  . Multiple Vitamin (MULTIVITAMIN) tablet Take 1 tablet by mouth daily.    Marland Kitchen omega-3 fish oil (MAXEPA) 1000 MG CAPS capsule Take 2 capsules by mouth daily. TAKING 1200mg  DAILY     No current facility-administered medications on file prior to visit.     Review of Systems:  As per HPI- otherwise negative. No fever or chills  Knee is not red or hot She otherwise continues to have issues with her back-     Physical Examination: Vitals:   10/16/17 1512  BP: 118/70  Pulse: 78  Resp: 16  Temp: 97.7 F (36.5 C)  SpO2: 98%   Vitals:   10/16/17 1512  Weight: 157 lb (71.2 kg)  Height: 5\' 2"  (1.575 m)   Body mass index is 28.72 kg/m. Ideal Body Weight: Weight in (lb) to have BMI = 25: 136.4  GEN: WDWN, NAD, Non-toxic, A & O x 3, looks well HEENT: Atraumatic, Normocephalic. Neck supple. No masses, No LAD.  Bilateral TM wnl, oropharynx normal.  PEERL,EOMI.   Ears and Nose: No external deformity. CV: RRR, No M/G/R. No JVD. No thrill. No extra heart sounds. PULM: CTA B, no wheezes, crackles, rhonchi. No retractions. No resp. distress. No accessory muscle use. EXTR: No c/c/e NEURO Normal gait for pt- slow, she does uses a walker. Significant kyphosis is present  She is able to sense all monofilament sites and has normal DP pulses bilateral feet Her left knee shows good ROM and no redness or swelling.  It is not tender to ROm PSYCH: Normally interactive. Conversant. Not depressed or anxious appearing.  Calm demeanor.    Assessment and Plan: Acute pain of left knee  Other polyneuropathy - Plan: Ambulatory referral to Neurology  S/P total knee arthroplasty, left - Plan: clindamycin (CLEOCIN) 300 MG capsule  Following up today She is already seeing sports med for her  knee.  She really just wanted to keep me in the loop.  At this time she does not wish to see ortho Did a referral to neurology for her neuropathy  Refilled clindamycin for her to use prior to dental work  Signed Lamar Blinks, MD

## 2017-10-16 ENCOUNTER — Encounter: Payer: Self-pay | Admitting: Family Medicine

## 2017-10-16 ENCOUNTER — Encounter: Payer: Self-pay | Admitting: Physical Therapy

## 2017-10-16 ENCOUNTER — Other Ambulatory Visit: Payer: Self-pay

## 2017-10-16 ENCOUNTER — Ambulatory Visit: Payer: Medicare Other | Attending: Family Medicine | Admitting: Physical Therapy

## 2017-10-16 ENCOUNTER — Ambulatory Visit (INDEPENDENT_AMBULATORY_CARE_PROVIDER_SITE_OTHER): Payer: Medicare Other | Admitting: Family Medicine

## 2017-10-16 VITALS — BP 118/70 | HR 78 | Temp 97.7°F | Resp 16 | Ht 62.0 in | Wt 157.0 lb

## 2017-10-16 DIAGNOSIS — M25562 Pain in left knee: Secondary | ICD-10-CM | POA: Diagnosis not present

## 2017-10-16 DIAGNOSIS — M25662 Stiffness of left knee, not elsewhere classified: Secondary | ICD-10-CM | POA: Diagnosis not present

## 2017-10-16 DIAGNOSIS — M6281 Muscle weakness (generalized): Secondary | ICD-10-CM | POA: Diagnosis not present

## 2017-10-16 DIAGNOSIS — R293 Abnormal posture: Secondary | ICD-10-CM | POA: Diagnosis not present

## 2017-10-16 DIAGNOSIS — R262 Difficulty in walking, not elsewhere classified: Secondary | ICD-10-CM

## 2017-10-16 DIAGNOSIS — Z96652 Presence of left artificial knee joint: Secondary | ICD-10-CM

## 2017-10-16 DIAGNOSIS — G6289 Other specified polyneuropathies: Secondary | ICD-10-CM | POA: Diagnosis not present

## 2017-10-16 MED ORDER — CLINDAMYCIN HCL 300 MG PO CAPS: ORAL_CAPSULE | ORAL | 0 refills | Status: DC

## 2017-10-16 NOTE — Addendum Note (Signed)
Addended by: Sherrie George F on: 10/16/2017 02:22 PM   Modules accepted: Orders

## 2017-10-16 NOTE — Therapy (Signed)
Tanya Harris [address]  [place], [place], [address] Phone: [phone]   Fax:  [phone]  Physical Therapy Evaluation  Patient Details  Name: Tanya Harris MRN: [id number] Date of Birth: February 16, 1933 Referring Provider: Karlton Lemon, MD   Encounter Date: 10/16/2017  PT End of Session - 10/16/17 1215    Visit Number  1    Number of Visits  13    Date for PT Re-Evaluation  11/27/17    Authorization Type  Medicare    PT Start Time  0846    PT Stop Time  0927    PT Time Calculation (min)  41 min    Activity Tolerance  Patient tolerated treatment well;Patient limited by pain    Behavior During Therapy  [facility] for tasks assessed/performed       Past Medical History:  Diagnosis Date  . COPD (chronic obstructive pulmonary disease) (HCC)    Mild  . DJD (degenerative joint disease) of knee   . Scoliosis   . Spinal stenosis     Past Surgical History:  Procedure Laterality Date  . APPENDECTOMY  2005  . CATARACT EXTRACTION     Bilateral  . NASAL SINUS SURGERY    . TONSILLECTOMY    . TOTAL KNEE ARTHROPLASTY  2001   Left    There were no vitals filed for this visit.   Subjective Assessment - 10/16/17 0847    Subjective  Patient reports L knee pain for the last 2.5 weeks. Had also had this pain a month prior which resolved with rest. Patient with hx of R TKA in 2011 on L side. Pain is centered along medial and lateral jt lines of L knee, no tenderness. Worse with WBing. Patient also reporting neuropathy in B feet, radiating up to L knee. Patient walks with rollator at baseline. Patient reports degenerative disease in her back and it is affecting her posture.    Pertinent History  spinal stenosis, scoliosis, DJD of knee, L TKA, COPD    Limitations  Lifting;Standing;Walking;House hold activities;Sitting    How long can you sit comfortably?  inconsistent    How long can you stand comfortably?  0 min, better if  holding onto rollator    How long can you walk comfortably?  0 min, better if holding onto rollator    Diagnostic tests  10/01/17: L knee xray: No acute or significant chronic bony abnormality of the left knee. There are soft tissue calcifications in the suprapatellar region. 10/08/17 L hip xray: No evidence for acute abnormality. 10/09/17 L knee CT: 1. Left total knee arthroplasty without hardware failure     Patient Stated Goals  get rid of the pain    Currently in Pain?  Yes    Pain Score  7     Pain Location  Knee    Pain Orientation  Left;Medial;Lateral    Pain Descriptors / Indicators  Sharp;Aching    Pain Type  Chronic pain    Aggravating Factors   prolonged sitting, standing, walking     Pain Relieving Factors  tylenol         [facility] PT Assessment - 10/16/17 0903      Assessment   Medical Diagnosis  Chronic pain of L knee    Referring Provider  Tanya Lemon, MD    Onset Date/Surgical Date  07/16/17    Next MD Visit  --   not scheduled   Prior Therapy  Yes- back  Precautions   Precautions  None      Restrictions   Weight Bearing Restrictions  No      Balance Screen   Has the patient fallen in the past 6 months  No    Has the patient had a decrease in activity level because of a fear of falling?   No    Is the patient reluctant to leave their home because of a fear of falling?   No      Home Film/video editor residence    Living Arrangements  Spouse/significant other    Available Help at Discharge  Family    Type of [place] to enter    Entrance Stairs-Number of Steps  [place]  One level    [place] - 4 wheels;[place] - single Harris      Prior Function   Level of Independence  Independent      Cognition   Overall Cognitive Status  Within Functional Limits for tasks assessed      Observation/Other Assessments   Focus on Therapeutic Outcomes (FOTO)    Knee: 32 (68% limited, 51% predicted)      Sensation   Light Touch  Appears Intact   reports B feet neuropathy     Coordination   Gross Motor Movements are Fluid and Coordinated  --    Fine Motor Movements are Fluid and Coordinated  Yes      Posture/Postural Control   Posture/Postural Control  Postural limitations   severe scoliotic curve to R   Postural Limitations  Rounded Shoulders;Forward head;Increased thoracic kyphosis      ROM / Strength   AROM / PROM / Strength  AROM;PROM;Strength      AROM   AROM Assessment Site  Knee    Right/Left Knee  Left;Right    Right Knee Extension  0    Right Knee Flexion  136    Left Knee Extension  0    Left Knee Flexion  107      PROM   PROM Assessment Site  Knee    Right/Left Knee  Left;Right    Right Knee Extension  -1    Right Knee Flexion  140    Left Knee Extension  0    Left Knee Flexion  110      Strength   Strength Assessment Site  Hip;Knee;Ankle    Right/Left Hip  Right;Left    Right Hip Flexion  4-/5    Right Hip ABduction  4/5    Right Hip ADduction  4-/5    Left Hip Flexion  4-/5    Left Hip ABduction  4/5    Left Hip ADduction  4-/5    Right/Left Knee  Right;Left    Right Knee Flexion  4/5    Right Knee Extension  4/5    Left Knee Flexion  4-/5   pain diffusely in L knee   Left Knee Extension  4/5    Right/Left Ankle  Right;Left    Right Ankle Dorsiflexion  4/5    Right Ankle Plantar Flexion  4+/5    Left Ankle Dorsiflexion  4+/5    Left Ankle Plantar Flexion  4+/5      Flexibility   Soft Tissue Assessment /Muscle Length  yes    Hamstrings  --   WFL on L; painful  cramping in L quad     Palpation   Patella mobility  good mobility of L patella; mild pain with sup/inf mobs      Ambulation/Gait   Ambulation/Gait  Yes    Assistive device  4-wheeled walker    Gait Pattern  Step-to pattern;Step-through pattern;Decreased step length - left;Decreased step length - right;Decreased weight shift to left;Lateral  trunk lean to right;Trunk flexed   severe scoliotic curve causing trunk lean R   Gait velocity  decreased                Objective measurements completed on examination: See above findings.              PT Education - 10/16/17 1214    Education Details  prognosis, POC, HEP    Person(s) Educated  Patient    Methods  Explanation;Demonstration;Tactile cues;Verbal cues;Handout    Comprehension  Returned demonstration;Verbalized understanding       PT Short Term Goals - 10/16/17 1228      PT SHORT TERM GOAL #1   Title  Patient to be independent with initial HEP.    Time  3    Period  Weeks    Status  New    Target Date  11/06/17        PT Long Term Goals - 10/16/17 1228      PT LONG TERM GOAL #1   Title  Patient to be independent with advanced HEP.    Time  6    Period  Weeks    Status  New    Target Date  11/27/17      PT LONG TERM GOAL #2   Title  Patient to demonstrate B LE >=4+/5 strength.    Time  6    Period  Weeks    Status  New    Target Date  11/27/17      PT LONG TERM GOAL #3   Title  Patient to demonstrate L knee AROM/PROM without pain limiting and WFL.    Time  6    Period  Weeks    Status  New    Target Date  11/27/17      PT LONG TERM GOAL #4   Title  Patient to report tolerance of 15 min of standing without pain limiting.     Time  6    Period  Weeks    Status  New    Target Date  11/27/17             Plan - 10/16/17 1216    Clinical Impression Statement  Patient is a 84y/o F with hx of L TKA presenting to OPPT with c/o L knee pain. Recent L knee xray and CT normal. Reports pain is centered along medial and lateral joint lines of L knee, no tenderness. Worse with WBing, prolonged sitting, standing, walking. Patient uses rollator at baseline. Patient today with normal patellar mobility- mild pain with superior/inferior glides, decreased L knee ROM, decreased B LE strength, considerable postural impairments caused by hx of  scoliosis, gait deviations. Patient educated on and received handout on gentle strengthening HEP. Reported understanding. Patient with questions about iontophoresis per discussion with referring MD- educated patient on ionto use. Would benefit from skilled PT services 2x/week for 6 weeks to address aforementioned impairments.     Clinical Presentation  Stable    Clinical Decision Making  Low    Rehab Potential  Good    PT Frequency  2x / week    PT Duration  6 weeks    PT Treatment/Interventions  ADLs/Self Care Home Management;Cryotherapy;Iontophoresis 4mg /ml Dexamethasone;Moist Heat;Gait training;DME Instruction;Stair training;Functional mobility training;Therapeutic activities;Therapeutic exercise;Manual techniques;Patient/family education;Neuromuscular re-education;Balance training;Scar mobilization;Passive range of motion;Dry needling;Energy conservation;Splinting;Taping;Vasopneumatic Device    PT Next Visit Plan  reassess HEP    Consulted and Agree with Plan of Care  Patient       Patient will benefit from skilled therapeutic intervention in order to improve the following deficits and impairments:  Abnormal gait, Decreased endurance, Decreased activity tolerance, Decreased strength, Pain, Decreased mobility, Decreased balance, Difficulty walking, Improper body mechanics, Decreased range of motion, Impaired flexibility, Postural dysfunction  Visit Diagnosis: Acute pain of left knee  Stiffness of left knee, not elsewhere classified  Difficulty in walking, not elsewhere classified  Muscle weakness (generalized)  Abnormal posture     Problem List Patient Active Problem List   Diagnosis Date Noted  . Chronic pain of left knee 10/01/2017  . Lesion of skin of breast 12/12/2015  . Coccygeal contusion 05/02/2015  . Screening, ischemic heart disease 12/08/2014  . Hearing loss 12/08/2014  . Hyperlipidemia 06/04/2014  . Arthritis 04/15/2014  . Chronic allergic rhinitis 04/15/2014  .  COPD (chronic obstructive pulmonary disease) ([place]) 04/15/2014  . Medicare annual wellness visit, subsequent 11/30/2013  . COPD with chronic bronchitis ([place]) 11/02/2013  . Osteopenia 11/02/2013  . Gait instability 11/02/2013    Janene Harvey, PT, DPT 10/16/17 12:32 PM   [facility] [address]  [place], [place], [address] Phone: [phone]   Fax:  [phone]  Name: Adriann Thau MRN: [id number] Date of Birth: September 02, 1932

## 2017-10-16 NOTE — Patient Instructions (Addendum)
I will refill your antibiotics to use prior to dental work; clindamycin. Take 2 pills or 600 mg by mouth once about an hour prior to dental work.  Let me know how you do with your physical therapy- if you need a referral to orthopedics and/ or neuropathy please just le me know

## 2017-10-17 ENCOUNTER — Ambulatory Visit: Payer: Medicare Other

## 2017-10-17 DIAGNOSIS — M25562 Pain in left knee: Secondary | ICD-10-CM

## 2017-10-17 DIAGNOSIS — M6281 Muscle weakness (generalized): Secondary | ICD-10-CM

## 2017-10-17 DIAGNOSIS — R262 Difficulty in walking, not elsewhere classified: Secondary | ICD-10-CM

## 2017-10-17 DIAGNOSIS — R293 Abnormal posture: Secondary | ICD-10-CM

## 2017-10-17 DIAGNOSIS — M25662 Stiffness of left knee, not elsewhere classified: Secondary | ICD-10-CM

## 2017-10-17 NOTE — Therapy (Signed)
[facility] [address]  [place], [place], [address] Phone: [phone]   Fax:  [phone]  Physical Therapy Treatment  Patient Details  Name: Tanya Harris MRN: [id number] Date of Birth: 1932/11/29 Referring Provider: Karlton Lemon, MD   Encounter Date: 10/17/2017  PT End of Session - 10/17/17 0801    Visit Number  2    Number of Visits  13    Date for PT Re-Evaluation  11/27/17    Authorization Type  Medicare    PT Start Time  0756    PT Stop Time  0840    PT Time Calculation (min)  44 min    Activity Tolerance  Patient tolerated treatment well;Patient limited by pain    Behavior During Therapy  [facility] for tasks assessed/performed       Past Medical History:  Diagnosis Date  . COPD (chronic obstructive pulmonary disease) (HCC)    Mild  . DJD (degenerative joint disease) of knee   . Scoliosis   . Spinal stenosis     Past Surgical History:  Procedure Laterality Date  . APPENDECTOMY  2005  . CATARACT EXTRACTION     Bilateral  . NASAL SINUS SURGERY    . TONSILLECTOMY    . TOTAL KNEE ARTHROPLASTY  2001   Left    There were no vitals filed for this visit.  Subjective Assessment - 10/17/17 0800    Subjective  Pt. noting she would like to recieve iontophoresis today and looks forward to returning to water aerobics when able to [place].      Pertinent History  spinal stenosis, scoliosis, DJD of knee, L TKA, COPD    Diagnostic tests  10/01/17: L knee xray: No acute or significant chronic bony abnormality of the left knee. There are soft tissue calcifications in the suprapatellar region. 10/08/17 L hip xray: No evidence for acute abnormality. 10/09/17 L knee CT: 1. Left total knee arthroplasty without hardware failure     Patient Stated Goals  get rid of the pain    Currently in Pain?  Yes    Pain Score  5     Pain Location  Knee    Pain Orientation  Left;Medial;Lateral    Pain Descriptors /  Indicators  Sharp;Aching    Pain Type  Chronic pain    Pain Onset  More than a month ago    Multiple Pain Sites  No                       [facility] Adult PT Treatment/Exercise - 10/17/17 0804      Knee/Hip Exercises: Aerobic   Nustep  Lvl 1, 7 min       Knee/Hip Exercises: Seated   Other Seated Knee/Hip Exercises  Seated L fitter hip extension (2 blue bands) x 20 reps     Hamstring Curl  Left;10 reps;Strengthening    Hamstring Limitations  red TB at ankle   pt. tolerated well     Knee/Hip Exercises: Supine   Quad Sets  15 reps;Left;Strengthening    Quad Sets Limitations  5" hold     Bridges  Both;10 reps    Straight Leg Raises  Left;15 reps    Other Supine Knee/Hip Exercises  Hooklying alternating march with 2# at ankles x 10 reps   cues required for slow pacing      Modalities   Modalities  Iontophoresis      Iontophoresis  Type of Iontophoresis  Dexamethasone    Location  L anterior/lateral joing line of knee    Dose  1.35mL, 10mA-min    Time  4-6hr wear time              PT Education - 10/17/17 0936    Education Details  Iontophoresis educational handout including proper wear time and precautions and contraindications with pt. verbalizing understanding     Person(s) Educated  Patient    Methods  Explanation;Verbal cues;Handout    Comprehension  Verbalized understanding;Verbal cues required;Need further instruction       PT Short Term Goals - 10/17/17 0802      PT SHORT TERM GOAL #1   Title  Patient to be independent with initial HEP.    Time  3    Period  Weeks    Status  On-going        PT Long Term Goals - 10/17/17 0802      PT LONG TERM GOAL #1   Title  Patient to be independent with advanced HEP.    Time  6    Period  Weeks    Status  On-going      PT LONG TERM GOAL #2   Title  Patient to demonstrate B LE >=4+/5 strength.    Time  6    Period  Weeks    Status  On-going      PT LONG TERM GOAL #3   Title  Patient to  demonstrate L knee AROM/PROM without pain limiting and WFL.    Time  6    Period  Weeks    Status  On-going      PT LONG TERM GOAL #4   Title  Patient to report tolerance of 15 min of standing without pain limiting.     Time  6    Period  Weeks    Status  On-going            Plan - 10/17/17 0802    Clinical Impression Statement  Pt. doing well today noting she wishes to have iontophoresis applied to L knee today for pain relief.  MD signed order for iontophoresis and requesting therapy to use ionto patch.   Pt. issued educational handout for proper wear time, precautions/contraindications with pt. verbalizing understanding.  Pt. tolerated all gentle strengthening and ROM activities well today without issue.  Applied ionto patch 1#/6 to end visit.  Will continue to progress toward goals.      PT Treatment/Interventions  ADLs/Self Care Home Management;Cryotherapy;Iontophoresis 4mg /ml Dexamethasone;Moist Heat;Gait training;DME Instruction;Stair training;Functional mobility training;Therapeutic activities;Therapeutic exercise;Manual techniques;Patient/family education;Neuromuscular re-education;Balance training;Scar mobilization;Passive range of motion;Dry needling;Energy conservation;Splinting;Taping;Vasopneumatic Device    Consulted and Agree with Plan of Care  Patient       Patient will benefit from skilled therapeutic intervention in order to improve the following deficits and impairments:  Abnormal gait, Decreased endurance, Decreased activity tolerance, Decreased strength, Pain, Decreased mobility, Decreased balance, Difficulty walking, Improper body mechanics, Decreased range of motion, Impaired flexibility, Postural dysfunction  Visit Diagnosis: Acute pain of left knee  Stiffness of left knee, not elsewhere classified  Difficulty in walking, not elsewhere classified  Muscle weakness (generalized)  Abnormal posture     Problem List Patient Active Problem List   Diagnosis  Date Noted  . Chronic pain of left knee 10/01/2017  . Lesion of skin of breast 12/12/2015  . Coccygeal contusion 05/02/2015  . Screening, ischemic heart disease 12/08/2014  . Hearing loss 12/08/2014  .  Hyperlipidemia 06/04/2014  . Arthritis 04/15/2014  . Chronic allergic rhinitis 04/15/2014  . COPD (chronic obstructive pulmonary disease) ([place]) 04/15/2014  . Medicare annual wellness visit, subsequent 11/30/2013  . COPD with chronic bronchitis ([place]) 11/02/2013  . Osteopenia 11/02/2013  . Gait instability 11/02/2013    Bess Harvest, PTA 10/17/17 12:13 PM   [facility] [address]  [place], [place], [address] Phone: [phone]   Fax:  [phone]  Name: Tanya Harris MRN: [id number] Date of Birth: December 30, 1932

## 2017-10-17 NOTE — Patient Instructions (Signed)

## 2017-10-18 ENCOUNTER — Encounter: Payer: Self-pay | Admitting: Family Medicine

## 2017-10-18 DIAGNOSIS — M25562 Pain in left knee: Secondary | ICD-10-CM

## 2017-10-18 NOTE — Telephone Encounter (Signed)
Orthopedic referral pended for Dr. Lorelei Pont review per pt's preference.

## 2017-10-21 ENCOUNTER — Ambulatory Visit: Payer: Medicare Other

## 2017-10-21 DIAGNOSIS — R293 Abnormal posture: Secondary | ICD-10-CM

## 2017-10-21 DIAGNOSIS — M25562 Pain in left knee: Secondary | ICD-10-CM | POA: Diagnosis not present

## 2017-10-21 DIAGNOSIS — M25662 Stiffness of left knee, not elsewhere classified: Secondary | ICD-10-CM | POA: Diagnosis not present

## 2017-10-21 DIAGNOSIS — R262 Difficulty in walking, not elsewhere classified: Secondary | ICD-10-CM | POA: Diagnosis not present

## 2017-10-21 DIAGNOSIS — M6281 Muscle weakness (generalized): Secondary | ICD-10-CM

## 2017-10-21 NOTE — Therapy (Signed)
[facility] [address]  [place], [place], [address] Phone: [phone]   Fax:  [phone]  Physical Therapy Treatment  Patient Details  Name: Tanya Harris MRN: [id number] Date of Birth: December 22, 1932 Referring Provider: Karlton Lemon, MD   Encounter Date: 10/21/2017  PT End of Session - 10/21/17 0932    Visit Number  3    Number of Visits  13    Date for PT Re-Evaluation  11/27/17    Authorization Type  Medicare    PT Start Time  0929    PT Stop Time  1010    PT Time Calculation (min)  41 min    Activity Tolerance  Patient tolerated treatment well    Behavior During Therapy  [facility] for tasks assessed/performed       Past Medical History:  Diagnosis Date  . COPD (chronic obstructive pulmonary disease) (HCC)    Mild  . DJD (degenerative joint disease) of knee   . Scoliosis   . Spinal stenosis     Past Surgical History:  Procedure Laterality Date  . APPENDECTOMY  2005  . CATARACT EXTRACTION     Bilateral  . NASAL SINUS SURGERY    . TONSILLECTOMY    . TOTAL KNEE ARTHROPLASTY  2001   Left    There were no vitals filed for this visit.  Subjective Assessment - 10/21/17 0930    Subjective  Pt. noting only slight benefit from ionto patch applied last visit.      Pertinent History  spinal stenosis, scoliosis, DJD of knee, L TKA, COPD    Diagnostic tests  10/01/17: L knee xray: No acute or significant chronic bony abnormality of the left knee. There are soft tissue calcifications in the suprapatellar region. 10/08/17 L hip xray: No evidence for acute abnormality. 10/09/17 L knee CT: 1. Left total knee arthroplasty without hardware failure     Patient Stated Goals  get rid of the pain    Currently in Pain?  No/denies    Pain Score  0-No pain   Pt. noting 5/10 pain at worst while standing in kitchen    Pain Location  Knee    Pain Orientation  Left    Multiple Pain Sites  No                        [facility] Adult PT Treatment/Exercise - 10/21/17 0949      Knee/Hip Exercises: Aerobic   Nustep  Lvl 4, 7 min       Knee/Hip Exercises: Standing   Heel Raises  Both;15 reps    Heel Raises Limitations  Heel + toe raise     Terminal Knee Extension  Left;15 reps   Cues required for proper motion    Theraband Level (Terminal Knee Extension)  Level 2 (Red)      Knee/Hip Exercises: Seated   Long Arc Quad  Both;15 reps    Long Arc Quad Limitations  3" hold     Other Seated Knee/Hip Exercises  Seated B fitter hip extension (1 blue, 1 black band) x 15 reps     Hamstring Curl  Left;15 reps;Strengthening    Hamstring Limitations  red TB at ankle    Sit to Sand  --   last five reps with UE pushoff from [employer]irex      Iontophoresis   Type of Iontophoresis  Dexamethasone   patch#2/6   Location  L anterior/lateral joing  line of knee    Dose  1.41mL, 37mA-min    Time  4-6hr wear time                PT Short Term Goals - 10/17/17 0802      PT SHORT TERM GOAL #1   Title  Patient to be independent with initial HEP.    Time  3    Period  Weeks    Status  On-going        PT Long Term Goals - 10/17/17 0802      PT LONG TERM GOAL #1   Title  Patient to be independent with advanced HEP.    Time  6    Period  Weeks    Status  On-going      PT LONG TERM GOAL #2   Title  Patient to demonstrate B LE >=4+/5 strength.    Time  6    Period  Weeks    Status  On-going      PT LONG TERM GOAL #3   Title  Patient to demonstrate L knee AROM/PROM without pain limiting and WFL.    Time  6    Period  Weeks    Status  On-going      PT LONG TERM GOAL #4   Title  Patient to report tolerance of 15 min of standing without pain limiting.     Time  6    Period  Weeks    Status  On-going            Plan - 10/21/17 6295    Clinical Impression Statement  Pt. noting some improvement in L knee pain since last visit however attributes this to HEP activities.   Feels ionto patch may have helped pain levels, "some".  Pt. did request to apply iontophoresis patch #2/6 to end visit.  Pt. tolerated mild progression of seated and standing strengthening activities well today.  Does require occasional sitting rest breaks due being prone to fatiguing easily with standing activities.  Will continue to progress toward goals.      PT Treatment/Interventions  ADLs/Self Care Home Management;Cryotherapy;Iontophoresis 4mg /ml Dexamethasone;Moist Heat;Gait training;DME Instruction;Stair training;Functional mobility training;Therapeutic activities;Therapeutic exercise;Manual techniques;Patient/family education;Neuromuscular re-education;Balance training;Scar mobilization;Passive range of motion;Dry needling;Energy conservation;Splinting;Taping;Vasopneumatic Device    Consulted and Agree with Plan of Care  Patient       Patient will benefit from skilled therapeutic intervention in order to improve the following deficits and impairments:  Abnormal gait, Decreased endurance, Decreased activity tolerance, Decreased strength, Pain, Decreased mobility, Decreased balance, Difficulty walking, Improper body mechanics, Decreased range of motion, Impaired flexibility, Postural dysfunction  Visit Diagnosis: Acute pain of left knee  Stiffness of left knee, not elsewhere classified  Difficulty in walking, not elsewhere classified  Muscle weakness (generalized)  Abnormal posture     Problem List Patient Active Problem List   Diagnosis Date Noted  . Chronic pain of left knee 10/01/2017  . Lesion of skin of breast 12/12/2015  . Coccygeal contusion 05/02/2015  . Screening, ischemic heart disease 12/08/2014  . Hearing loss 12/08/2014  . Hyperlipidemia 06/04/2014  . Arthritis 04/15/2014  . Chronic allergic rhinitis 04/15/2014  . COPD (chronic obstructive pulmonary disease) ([place]) 04/15/2014  . Medicare annual wellness visit, subsequent 11/30/2013  . COPD with chronic bronchitis  ([place]) 11/02/2013  . Osteopenia 11/02/2013  . Gait instability 11/02/2013    Bess Harvest, PTA 10/21/17 2:30 PM    [facility] [address]  [place], [place], [address] Phone: [phone]   Fax:  [phone]  Name: Tanya Harris MRN: [id number] Date of Birth: Jan 17, 1933

## 2017-10-23 ENCOUNTER — Ambulatory Visit: Payer: Medicare Other

## 2017-10-23 DIAGNOSIS — M6281 Muscle weakness (generalized): Secondary | ICD-10-CM

## 2017-10-23 DIAGNOSIS — M25662 Stiffness of left knee, not elsewhere classified: Secondary | ICD-10-CM | POA: Diagnosis not present

## 2017-10-23 DIAGNOSIS — M25562 Pain in left knee: Secondary | ICD-10-CM | POA: Diagnosis not present

## 2017-10-23 DIAGNOSIS — R262 Difficulty in walking, not elsewhere classified: Secondary | ICD-10-CM | POA: Diagnosis not present

## 2017-10-23 DIAGNOSIS — R293 Abnormal posture: Secondary | ICD-10-CM | POA: Diagnosis not present

## 2017-10-23 NOTE — Therapy (Signed)
[facility] [address]  [place], [place], [address] Phone: [phone]   Fax:  [phone]  Physical Therapy Treatment  Patient Details  Name: Tanya Harris MRN: [id number] Date of Birth: Jan 20, 1933 Referring Provider: Karlton Lemon, MD   Encounter Date: 10/23/2017  PT End of Session - 10/23/17 0939    Visit Number  4    Number of Visits  13    Date for PT Re-Evaluation  11/27/17    Authorization Type  Medicare    PT Start Time  0932    PT Stop Time  1010    PT Time Calculation (min)  38 min    Activity Tolerance  Patient tolerated treatment well    Behavior During Therapy  [facility] for tasks assessed/performed       Past Medical History:  Diagnosis Date  . COPD (chronic obstructive pulmonary disease) (HCC)    Mild  . DJD (degenerative joint disease) of knee   . Scoliosis   . Spinal stenosis     Past Surgical History:  Procedure Laterality Date  . APPENDECTOMY  2005  . CATARACT EXTRACTION     Bilateral  . NASAL SINUS SURGERY    . TONSILLECTOMY    . TOTAL KNEE ARTHROPLASTY  2001   Left    There were no vitals filed for this visit.  Subjective Assessment - 10/23/17 0938    Subjective  Notes good benefit from ionto patch applied last visit.  Had significantly less pain last few days.  Some soreness in quad muscles which subsided.      Pertinent History  spinal stenosis, scoliosis, DJD of knee, L TKA, COPD    Diagnostic tests  10/01/17: L knee xray: No acute or significant chronic bony abnormality of the left knee. There are soft tissue calcifications in the suprapatellar region. 10/08/17 L hip xray: No evidence for acute abnormality. 10/09/17 L knee CT: 1. Left total knee arthroplasty without hardware failure     Patient Stated Goals  get rid of the pain    Currently in Pain?  Yes    Pain Score  3    up to 6/10 at worst    Pain Location  Knee    Pain Orientation  Left    Pain Descriptors / Indicators   Aching;Sharp    Pain Type  Chronic pain    Multiple Pain Sites  No                       [facility] Adult PT Treatment/Exercise - 10/23/17 0950      Knee/Hip Exercises: Aerobic   Nustep  Lvl 4, 7 min       Knee/Hip Exercises: Standing   Other Standing Knee Exercises  Side stepping at mat table with 2 HH assist 2 x 10 ft with yellow TB at ankels; pt. apprehensive of falling however well tolerated      Knee/Hip Exercises: Seated   Long Arc Quad  Both;15 reps    Long Arc Quad Limitations  3" hold     Ball Squeeze  5" x 10 reps     Other Seated Knee/Hip Exercises  Seated L fitter hip extension (1 blue, 1 black band) x 15 reps     Marching  Right;Left;10 reps    Hamstring Curl  Left;15 reps;Strengthening    Hamstring Limitations  red TB at ankle    Sit to Sand  2 sets;5 reps;with UE support  from chair with airex pad > standing on airex pad              PT Short Term Goals - 10/17/17 0802      PT SHORT TERM GOAL #1   Title  Patient to be independent with initial HEP.    Time  3    Period  Weeks    Status  On-going        PT Long Term Goals - 10/17/17 0802      PT LONG TERM GOAL #1   Title  Patient to be independent with advanced HEP.    Time  6    Period  Weeks    Status  On-going      PT LONG TERM GOAL #2   Title  Patient to demonstrate B LE >=4+/5 strength.    Time  6    Period  Weeks    Status  On-going      PT LONG TERM GOAL #3   Title  Patient to demonstrate L knee AROM/PROM without pain limiting and WFL.    Time  6    Period  Weeks    Status  On-going      PT LONG TERM GOAL #4   Title  Patient to report tolerance of 15 min of standing without pain limiting.     Time  6    Period  Weeks    Status  On-going            Plan - 10/23/17 0940    Clinical Impression Statement  Tanya Harris noting good benefit from ionto patch applied last visit.  Tolerated mild progression of LE strengthening activities well today.  Ended visit with pt.  declining ionto patch and verbalized that she may wish to use patch at upcoming visit.  Progressing well.      PT Treatment/Interventions  ADLs/Self Care Home Management;Cryotherapy;Iontophoresis 4mg /ml Dexamethasone;Moist Heat;Gait training;DME Instruction;Stair training;Functional mobility training;Therapeutic activities;Therapeutic exercise;Manual techniques;Patient/family education;Neuromuscular re-education;Balance training;Scar mobilization;Passive range of motion;Dry needling;Energy conservation;Splinting;Taping;Vasopneumatic Device    Consulted and Agree with Plan of Care  Patient       Patient will benefit from skilled therapeutic intervention in order to improve the following deficits and impairments:  Abnormal gait, Decreased endurance, Decreased activity tolerance, Decreased strength, Pain, Decreased mobility, Decreased balance, Difficulty walking, Improper body mechanics, Decreased range of motion, Impaired flexibility, Postural dysfunction  Visit Diagnosis: Acute pain of left knee  Stiffness of left knee, not elsewhere classified  Difficulty in walking, not elsewhere classified  Muscle weakness (generalized)  Abnormal posture     Problem List Patient Active Problem List   Diagnosis Date Noted  . Chronic pain of left knee 10/01/2017  . Lesion of skin of breast 12/12/2015  . Coccygeal contusion 05/02/2015  . Screening, ischemic heart disease 12/08/2014  . Hearing loss 12/08/2014  . Hyperlipidemia 06/04/2014  . Arthritis 04/15/2014  . Chronic allergic rhinitis 04/15/2014  . COPD (chronic obstructive pulmonary disease) ([place]) 04/15/2014  . Medicare annual wellness visit, subsequent 11/30/2013  . COPD with chronic bronchitis ([employer]) 11/02/2013  . Osteopenia 11/02/2013  . Gait instability 11/02/2013    Bess Harvest, PTA 10/23/17 12:03 PM   [facility] [address]  [place], [place], [address] Phone:  [phone]   Fax:  [phone]  Name: Tanya Harris MRN: [id number] Date of Birth: August 30, 1932

## 2017-10-28 ENCOUNTER — Ambulatory Visit: Payer: Medicare Other | Admitting: Physical Therapy

## 2017-10-28 ENCOUNTER — Encounter: Payer: Self-pay | Admitting: Physical Therapy

## 2017-10-28 DIAGNOSIS — M25562 Pain in left knee: Secondary | ICD-10-CM

## 2017-10-28 DIAGNOSIS — M25662 Stiffness of left knee, not elsewhere classified: Secondary | ICD-10-CM

## 2017-10-28 DIAGNOSIS — R262 Difficulty in walking, not elsewhere classified: Secondary | ICD-10-CM

## 2017-10-28 DIAGNOSIS — M6281 Muscle weakness (generalized): Secondary | ICD-10-CM

## 2017-10-28 DIAGNOSIS — R293 Abnormal posture: Secondary | ICD-10-CM

## 2017-10-28 NOTE — Therapy (Signed)
[facility] [address]  [place], [place], [address] Phone: [phone]   Fax:  [phone]  Physical Therapy Treatment  Patient Details  Name: Tanya Harris MRN: [id number] Date of Birth: 1932-09-25 Referring Provider: Karlton Lemon, MD   Encounter Date: 10/28/2017  PT End of Session - 10/28/17 1111    Visit Number  5    Number of Visits  13    Date for PT Re-Evaluation  11/27/17    Authorization Type  Medicare    PT Start Time  0930    PT Stop Time  1324    PT Time Calculation (min)  45 min    Activity Tolerance  Patient tolerated treatment well;Patient limited by pain    Behavior During Therapy  [facility] for tasks assessed/performed       Past Medical History:  Diagnosis Date  . COPD (chronic obstructive pulmonary disease) (HCC)    Mild  . DJD (degenerative joint disease) of knee   . Scoliosis   . Spinal stenosis     Past Surgical History:  Procedure Laterality Date  . APPENDECTOMY  2005  . CATARACT EXTRACTION     Bilateral  . NASAL SINUS SURGERY    . TONSILLECTOMY    . TOTAL KNEE ARTHROPLASTY  2001   Left    There were no vitals filed for this visit.  Subjective Assessment - 10/28/17 0932    Subjective  Reports L knee was flared up yesterday but not sure of the cause. Did a lot of walking which usually doesn't bother her. Had a lot of night pain. Husband is in the hospital due to a-fib.     Pertinent History  spinal stenosis, scoliosis, DJD of knee, L TKA, COPD    Diagnostic tests  10/01/17: L knee xray: No acute or significant chronic bony abnormality of the left knee. There are soft tissue calcifications in the suprapatellar region. 10/08/17 L hip xray: No evidence for acute abnormality. 10/09/17 L knee CT: 1. Left total knee arthroplasty without hardware failure     Patient Stated Goals  get rid of the pain    Currently in Pain?  Yes    Pain Score  5     Pain Location  Knee    Pain Orientation   Left    Pain Descriptors / Indicators  Aching;Sharp    Pain Type  Chronic pain                       [facility] Adult PT Treatment/Exercise - 10/28/17 0001      Exercises   Exercises  Knee/Hip      Knee/Hip Exercises: Aerobic   Nustep  L 2 x 6 min UE/LE      Knee/Hip Exercises: Standing   Terminal Knee Extension  Left;15 reps   chair support; cues for full extension   Theraband Level (Terminal Knee Extension)  Level 4 (Blue)      Knee/Hip Exercises: Seated   Long Arc Quad  Strengthening;Right;Left;1 set;15 reps;Weights;Limitations    Long Arc Quad Limitations  unable to tolerate 1#; cues for slow eccentric lower    Other Seated Knee/Hip Exercises  seated R & L knee extension (1 blue and 1 black) x10 each LE    Hamstring Curl  Strengthening;Right;Left;1 set;10 reps;Limitations    Hamstring Limitations  red TB at ankle    Sit to Sand  2 sets;5 reps;with UE support   on 1  foam pad; heavy cues for lean chest forward     Knee/Hip Exercises: Supine   Bridges  Both;10 reps;1 set;Limitations    Bridges Limitations  to tolerance; cues for foot placement    Bridges with [employer]  Strengthening;Both;1 set;10 reps;Limitations      Knee/Hip Exercises: Sidelying   Clams  15x each LE      Iontophoresis   Type of Iontophoresis  Dexamethasone   patch #3/6   Location  L anterior/lateral joint line of knee    Dose  1.58mL, 81mA-min    Time  4-6hr wear time              PT Education - 10/28/17 1111    Education Details  reminder on iontophoresis wear time, removal, precautions    Person(s) Educated  Patient    Methods  Explanation    Comprehension  Verbalized understanding       PT Short Term Goals - 10/17/17 0802      PT SHORT TERM GOAL #1   Title  Patient to be independent with initial HEP.    Time  3    Period  Weeks    Status  On-going        PT Long Term Goals - 10/17/17 0802      PT LONG TERM GOAL #1   Title  Patient to be independent with  advanced HEP.    Time  6    Period  Weeks    Status  On-going      PT LONG TERM GOAL #2   Title  Patient to demonstrate B LE >=4+/5 strength.    Time  6    Period  Weeks    Status  On-going      PT LONG TERM GOAL #3   Title  Patient to demonstrate L knee AROM/PROM without pain limiting and WFL.    Time  6    Period  Weeks    Status  On-going      PT LONG TERM GOAL #4   Title  Patient to report tolerance of 15 min of standing without pain limiting.     Time  6    Period  Weeks    Status  On-going            Plan - 10/28/17 1112    Clinical Impression Statement  Patient arrived to session with report of flare up in L knee yesterday but unsure of the cause. Requesting 3rd ionto patch to L knee today. Patient performed progressive LE strengthening ther-ex this session. Introduced clamshells with good tolerance, however reporting L knee pain with addition of 1 lb weight to LAQ. Better tolerance without weight. Worked on promoting anterior trunk lean and less dependence on UE support with STS today. Received ionto patch 3/6 to L knee at end of session. Reminded patient of precautions, wear time, removal. Patient reported understanding.     PT Treatment/Interventions  ADLs/Self Care Home Management;Cryotherapy;Iontophoresis 4mg /ml Dexamethasone;Moist Heat;Gait training;DME Instruction;Stair training;Functional mobility training;Therapeutic activities;Therapeutic exercise;Manual techniques;Patient/family education;Neuromuscular re-education;Balance training;Scar mobilization;Passive range of motion;Dry needling;Energy conservation;Splinting;Taping;Vasopneumatic Device    Consulted and Agree with Plan of Care  Patient       Patient will benefit from skilled therapeutic intervention in order to improve the following deficits and impairments:  Abnormal gait, Decreased endurance, Decreased activity tolerance, Decreased strength, Pain, Decreased mobility, Decreased balance, Difficulty  walking, Improper body mechanics, Decreased range of motion, Impaired flexibility, Postural dysfunction  Visit Diagnosis: Acute pain  of left knee  Stiffness of left knee, not elsewhere classified  Difficulty in walking, not elsewhere classified  Muscle weakness (generalized)  Abnormal posture     Problem List Patient Active Problem List   Diagnosis Date Noted  . Chronic pain of left knee 10/01/2017  . Lesion of skin of breast 12/12/2015  . Coccygeal contusion 05/02/2015  . Screening, ischemic heart disease 12/08/2014  . Hearing loss 12/08/2014  . Hyperlipidemia 06/04/2014  . Arthritis 04/15/2014  . Chronic allergic rhinitis 04/15/2014  . COPD (chronic obstructive pulmonary disease) ([place]) 04/15/2014  . Medicare annual wellness visit, subsequent 11/30/2013  . COPD with chronic bronchitis ([place]) 11/02/2013  . Osteopenia 11/02/2013  . Gait instability 11/02/2013    Janene Harvey, PT, DPT 10/28/17 11:17 AM   [facility] [place], [place], [address] Phone: [phone]   Fax:  [phone]  Name: Tanya Harris MRN: [id number] Date of Birth: 10-05-1932

## 2017-10-30 ENCOUNTER — Ambulatory Visit: Payer: Medicare Other

## 2017-10-30 DIAGNOSIS — R293 Abnormal posture: Secondary | ICD-10-CM

## 2017-10-30 DIAGNOSIS — R262 Difficulty in walking, not elsewhere classified: Secondary | ICD-10-CM | POA: Diagnosis not present

## 2017-10-30 DIAGNOSIS — M25562 Pain in left knee: Secondary | ICD-10-CM

## 2017-10-30 DIAGNOSIS — M25662 Stiffness of left knee, not elsewhere classified: Secondary | ICD-10-CM

## 2017-10-30 DIAGNOSIS — M6281 Muscle weakness (generalized): Secondary | ICD-10-CM | POA: Diagnosis not present

## 2017-10-30 NOTE — Therapy (Signed)
[facility] [address]  [place], [place], [address] Phone: [phone]   Fax:  [phone]  Physical Therapy Treatment  Patient Details  Name: Tanya Harris MRN: [id number] Date of Birth: [birth year] Referring Provider: Karlton Lemon, MD   Encounter Date: 10/30/2017  PT End of Session - 10/30/17 0936    Visit Number  6    Number of Visits  13    Date for PT Re-Evaluation  11/27/17    Authorization Type  Medicare    PT Start Time  0932    PT Stop Time  1010    PT Time Calculation (min)  38 min    Activity Tolerance  Patient tolerated treatment well    Behavior During Therapy  [facility] for tasks assessed/performed       Past Medical History:  Diagnosis Date  . COPD (chronic obstructive pulmonary disease) (HCC)    Mild  . DJD (degenerative joint disease) of knee   . Scoliosis   . Spinal stenosis     Past Surgical History:  Procedure Laterality Date  . APPENDECTOMY  2005  . CATARACT EXTRACTION     Bilateral  . NASAL SINUS SURGERY    . TONSILLECTOMY    . TOTAL KNEE ARTHROPLASTY  2001   Left    There were no vitals filed for this visit.  Subjective Assessment - 10/30/17 0934    Subjective  Pt. noting good relief from ionto patch applied last visit and doing well today.      Pertinent History  spinal stenosis, scoliosis, DJD of knee, L TKA, COPD    Diagnostic tests  10/01/17: L knee xray: No acute or significant chronic bony abnormality of the left knee. There are soft tissue calcifications in the suprapatellar region. 10/08/17 L hip xray: No evidence for acute abnormality. 10/09/17 L knee CT: 1. Left total knee arthroplasty without hardware failure     Patient Stated Goals  get rid of the pain    Currently in Pain?  Yes    Pain Score  3     Pain Location  Knee    Pain Orientation  Left    Pain Descriptors / Indicators  Aching;Sharp    Pain Type  Chronic pain    Pain Onset  More than a month ago    Pain  Frequency  Intermittent    Multiple Pain Sites  No                       [facility] Adult PT Treatment/Exercise - 10/30/17 0945      Knee/Hip Exercises: Aerobic   Nustep  L 2 x 6 min UE/LE      Knee/Hip Exercises: Standing   Heel Raises  Both;15 reps    Heel Raises Limitations  Heel + toe raise     Terminal Knee Extension  Left;15 reps   in staggered stance with one UE support   Theraband Level (Terminal Knee Extension)  Level 4 (Blue)    Other Standing Knee Exercises  Side stepping at mat table with 2 HH assist 2 x 10 ft with yellow TB at ankels; pt. apprehensive of falling however well tolerated      Knee/Hip Exercises: Seated   Other Seated Knee/Hip Exercises  seated R & L knee extension (1 blue and 1 black) x15 each LE    Hamstring Curl  Strengthening;Right;Left;1 set;Limitations;15 reps    Hamstring Limitations  red TB at  ankle    Sit to Sand  1 set;10 reps;with UE support   from airex pad on table with light UE pushoff     Knee/Hip Exercises: Supine   Bridges  Both;Strengthening   x 12 reps    Other Supine Knee/Hip Exercises  Hooklying adduction ball squeeze 5" x 15 reps                PT Short Term Goals - 10/30/17 0939      PT SHORT TERM GOAL #1   Title  Patient to be independent with initial HEP.    Time  3    Period  Weeks    Status  Achieved        PT Long Term Goals - 10/17/17 0802      PT LONG TERM GOAL #1   Title  Patient to be independent with advanced HEP.    Time  6    Period  Weeks    Status  On-going      PT LONG TERM GOAL #2   Title  Patient to demonstrate B LE >=4+/5 strength.    Time  6    Period  Weeks    Status  On-going      PT LONG TERM GOAL #3   Title  Patient to demonstrate L knee AROM/PROM without pain limiting and WFL.    Time  6    Period  Weeks    Status  On-going      PT LONG TERM GOAL #4   Title  Patient to report tolerance of 15 min of standing without pain limiting.     Time  6    Period  Weeks     Status  On-going            Plan - 10/30/17 5784    Clinical Impression Statement  Pt. doing well today noting good benefit from ionto patch last visit.  Pt. noting to hold off on next patch as she wishes to space out patch application across POC.  Tolerated all gentle LE strengthening and ROM activities well today without issue.  Most difficulty with side stepping with therapist support as pt. very apprehensive of falling.  Will continue to progress toward goals.      PT Treatment/Interventions  ADLs/Self Care Home Management;Cryotherapy;Iontophoresis 4mg /ml Dexamethasone;Moist Heat;Gait training;DME Instruction;Stair training;Functional mobility training;Therapeutic activities;Therapeutic exercise;Manual techniques;Patient/family education;Neuromuscular re-education;Balance training;Scar mobilization;Passive range of motion;Dry needling;Energy conservation;Splinting;Taping;Vasopneumatic Device    Consulted and Agree with Plan of Care  Patient       Patient will benefit from skilled therapeutic intervention in order to improve the following deficits and impairments:  Abnormal gait, Decreased endurance, Decreased activity tolerance, Decreased strength, Pain, Decreased mobility, Decreased balance, Difficulty walking, Improper body mechanics, Decreased range of motion, Impaired flexibility, Postural dysfunction  Visit Diagnosis: Acute pain of left knee  Stiffness of left knee, not elsewhere classified  Difficulty in walking, not elsewhere classified  Muscle weakness (generalized)  Abnormal posture     Problem List Patient Active Problem List   Diagnosis Date Noted  . Chronic pain of left knee 10/01/2017  . Lesion of skin of breast 12/12/2015  . Coccygeal contusion 05/02/2015  . Screening, ischemic heart disease 12/08/2014  . Hearing loss 12/08/2014  . Hyperlipidemia 06/04/2014  . Arthritis 04/15/2014  . Chronic allergic rhinitis 04/15/2014  . COPD (chronic obstructive  pulmonary disease) ([place]) 04/15/2014  . Medicare annual wellness visit, subsequent 11/30/2013  . COPD with chronic bronchitis ([place]) 11/02/2013  .  Osteopenia 11/02/2013  . Gait instability 11/02/2013    Bess Harvest, PTA 10/30/17 1:40 PM   [facility] [address]  [place], [place], [address] Phone: [phone]   Fax:  [phone]  Name: Tanya Harris MRN: [id number] Date of Birth: May 29, 1932

## 2017-11-01 ENCOUNTER — Ambulatory Visit: Payer: PRIVATE HEALTH INSURANCE | Admitting: Family Medicine

## 2017-11-06 ENCOUNTER — Ambulatory Visit: Payer: Medicare Other | Attending: Family Medicine

## 2017-11-06 DIAGNOSIS — R293 Abnormal posture: Secondary | ICD-10-CM | POA: Diagnosis not present

## 2017-11-06 DIAGNOSIS — R262 Difficulty in walking, not elsewhere classified: Secondary | ICD-10-CM | POA: Diagnosis not present

## 2017-11-06 DIAGNOSIS — M25562 Pain in left knee: Secondary | ICD-10-CM

## 2017-11-06 DIAGNOSIS — M25662 Stiffness of left knee, not elsewhere classified: Secondary | ICD-10-CM | POA: Diagnosis not present

## 2017-11-06 DIAGNOSIS — M6281 Muscle weakness (generalized): Secondary | ICD-10-CM | POA: Diagnosis not present

## 2017-11-06 NOTE — Therapy (Signed)
[facility] [address]  [place], [place], [address] Phone: [phone]   Fax:  [phone]  Physical Therapy Treatment  Patient Details  Name: Tanya Harris MRN: [id number] Date of Birth: 02-17-1933 Referring Provider: Karlton Lemon, MD   Encounter Date: 11/06/2017  PT End of Session - 11/06/17 0951    Visit Number  7    Number of Visits  13    Date for PT Re-Evaluation  11/27/17    Authorization Type  Medicare    PT Start Time  0931    PT Stop Time  1012    PT Time Calculation (min)  41 min    Activity Tolerance  Patient tolerated treatment well    Behavior During Therapy  [facility] for tasks assessed/performed       Past Medical History:  Diagnosis Date  . COPD (chronic obstructive pulmonary disease) (HCC)    Mild  . DJD (degenerative joint disease) of knee   . Scoliosis   . Spinal stenosis     Past Surgical History:  Procedure Laterality Date  . APPENDECTOMY  2005  . CATARACT EXTRACTION     Bilateral  . NASAL SINUS SURGERY    . TONSILLECTOMY    . TOTAL KNEE ARTHROPLASTY  2001   Left    There were no vitals filed for this visit.  Subjective Assessment - 11/06/17 0946    Subjective  Pt. noting [facility] and felt a "crunch" while walking back to car in parking lot which was painful however was relieved following sitting in car.  Notes increased L knee pain today.      Pertinent History  spinal stenosis, scoliosis, DJD of knee, L TKA, COPD    Diagnostic tests  10/01/17: L knee xray: No acute or significant chronic bony abnormality of the left knee. There are soft tissue calcifications in the suprapatellar region. 10/08/17 L hip xray: No evidence for acute abnormality. 10/09/17 L knee CT: 1. Left total knee arthroplasty without hardware failure     Patient Stated Goals  get rid of the pain    Currently in Pain?  Yes    Pain Score  8     Pain Location  Knee     Pain Orientation  Left    Pain Descriptors / Indicators  Aching;Sharp    Pain Type  Chronic pain    Pain Frequency  Intermittent    Aggravating Factors   Prolonged sitting, walking     Multiple Pain Sites  No                       [facility] Adult PT Treatment/Exercise - 11/06/17 0948      Knee/Hip Exercises: Aerobic   Nustep  L 4 x 5 min UE/LE      Knee/Hip Exercises: Standing   Heel Raises  Both;20 reps;3 seconds    Heel Raises Limitations  heel raise     Other Standing Knee Exercises  Side stepping at mat table with 2 HH assist 4 x 10 ft with yellow TB at ankels; pt. apprehensive of falling however well tolerated    Other Standing Knee Exercises  standing toe raise in RW x 20 rpes       Knee/Hip Exercises: Seated   Clamshell with TheraBand  Yellow   x 15 reps    Other Seated Knee/Hip Exercises  seated R & L fitter  leg press (1 blue and 1 black on R), (1 black on L)  x15 each LE    Sit to Sand  1 set;10 reps;with UE support   5 reps with only R UE pushoff and 5 reps on L                PT Short Term Goals - 10/30/17 1093      PT SHORT TERM GOAL #1   Title  Patient to be independent with initial HEP.    Time  3    Period  Weeks    Status  Achieved        PT Long Term Goals - 10/17/17 0802      PT LONG TERM GOAL #1   Title  Patient to be independent with advanced HEP.    Time  6    Period  Weeks    Status  On-going      PT LONG TERM GOAL #2   Title  Patient to demonstrate B LE >=4+/5 strength.    Time  6    Period  Weeks    Status  On-going      PT LONG TERM GOAL #3   Title  Patient to demonstrate L knee AROM/PROM without pain limiting and WFL.    Time  6    Period  Weeks    Status  On-going      PT LONG TERM GOAL #4   Title  Patient to report tolerance of 15 min of standing without pain limiting.     Time  6    Period  Weeks    Status  On-going            Plan - 11/06/17 0959    Clinical Impression Statement  Tanya Harris  notes ~ 70% improvement in L knee pain since starting therapy.  Did note increased pain today and unsure of trigger however, conversation revealed that pt. walked a lot while bringing husband to [facility] yesterday and felt, "crunch" in L knee which was painful and was relieved with rest.  Tolerated mild progression of proximal hip strengthening activities today without increased pain or complaint.  Pt. opting to hold off on ionto patch until next visit.  States she is performing water aerobics 4x/wk without issue and feels her strength is improving.  Progressing well.      PT Treatment/Interventions  ADLs/Self Care Home Management;Cryotherapy;Iontophoresis 4mg /ml Dexamethasone;Moist Heat;Gait training;DME Instruction;Stair training;Functional mobility training;Therapeutic activities;Therapeutic exercise;Manual techniques;Patient/family education;Neuromuscular re-education;Balance training;Scar mobilization;Passive range of motion;Dry needling;Energy conservation;Splinting;Taping;Vasopneumatic Device    Consulted and Agree with Plan of Care  Patient       Patient will benefit from skilled therapeutic intervention in order to improve the following deficits and impairments:  Abnormal gait, Decreased endurance, Decreased activity tolerance, Decreased strength, Pain, Decreased mobility, Decreased balance, Difficulty walking, Improper body mechanics, Decreased range of motion, Impaired flexibility, Postural dysfunction  Visit Diagnosis: Acute pain of left knee  Stiffness of left knee, not elsewhere classified  Difficulty in walking, not elsewhere classified  Muscle weakness (generalized)  Abnormal posture     Problem List Patient Active Problem List   Diagnosis Date Noted  . Chronic pain of left knee 10/01/2017  . Lesion of skin of breast 12/12/2015  . Coccygeal contusion 05/02/2015  . Screening, ischemic heart disease 12/08/2014  . Hearing loss 12/08/2014  . Hyperlipidemia 06/04/2014  .  Arthritis 04/15/2014  . Chronic allergic rhinitis 04/15/2014  . COPD (chronic obstructive pulmonary disease) ([place])  04/15/2014  . Medicare annual wellness visit, subsequent 11/30/2013  . COPD with chronic bronchitis ([place]) 11/02/2013  . Osteopenia 11/02/2013  . Gait instability 11/02/2013    Bess Harvest, PTA 11/06/17 12:41 PM   [facility] [address]  [place], [place], [address] Phone: [phone]   Fax:  [phone]  Name: Tanya Harris MRN: [id number] Date of Birth: September 18, 1932

## 2017-11-08 ENCOUNTER — Ambulatory Visit: Payer: Medicare Other

## 2017-11-08 DIAGNOSIS — M25662 Stiffness of left knee, not elsewhere classified: Secondary | ICD-10-CM

## 2017-11-08 DIAGNOSIS — M25562 Pain in left knee: Secondary | ICD-10-CM | POA: Diagnosis not present

## 2017-11-08 DIAGNOSIS — R293 Abnormal posture: Secondary | ICD-10-CM

## 2017-11-08 DIAGNOSIS — R262 Difficulty in walking, not elsewhere classified: Secondary | ICD-10-CM | POA: Diagnosis not present

## 2017-11-08 DIAGNOSIS — M6281 Muscle weakness (generalized): Secondary | ICD-10-CM | POA: Diagnosis not present

## 2017-11-08 NOTE — Therapy (Signed)
[facility] [address]  [place], [place], [address] Phone: [phone]   Fax:  [phone]  Physical Therapy Treatment  Patient Details  Name: Tanya Harris MRN: [id number] Date of Birth: 1932/05/05 Referring Provider: Karlton Lemon, MD   Encounter Date: 11/08/2017  PT End of Session - 11/08/17 1102    Visit Number  8    Number of Visits  13    Date for PT Re-Evaluation  11/27/17    Authorization Type  Medicare    PT Start Time  1055    PT Stop Time  1140    PT Time Calculation (min)  45 min    Activity Tolerance  Patient tolerated treatment well    Behavior During Therapy  [facility] for tasks assessed/performed       Past Medical History:  Diagnosis Date  . COPD (chronic obstructive pulmonary disease) (HCC)    Mild  . DJD (degenerative joint disease) of knee   . Scoliosis   . Spinal stenosis     Past Surgical History:  Procedure Laterality Date  . APPENDECTOMY  2005  . CATARACT EXTRACTION     Bilateral  . NASAL SINUS SURGERY    . TONSILLECTOMY    . TOTAL KNEE ARTHROPLASTY  2001   Left    There were no vitals filed for this visit.  Subjective Assessment - 11/08/17 1101    Subjective  Pt. reporting she went and did water aerobics 4x this week and is feeling stronger.      Pertinent History  spinal stenosis, scoliosis, DJD of knee, L TKA, COPD    Diagnostic tests  10/01/17: L knee xray: No acute or significant chronic bony abnormality of the left knee. There are soft tissue calcifications in the suprapatellar region. 10/08/17 L hip xray: No evidence for acute abnormality. 10/09/17 L knee CT: 1. Left total knee arthroplasty without hardware failure     Patient Stated Goals  get rid of the pain    Currently in Pain?  No/denies    Pain Score  0-No pain    Multiple Pain Sites  No                       [facility] Adult PT Treatment/Exercise - 11/08/17 1122      Neuro Re-ed    Neuro Re-ed  Details   Forward walk with 2 HH assistance with therapist x 1 lap around mat table       Knee/Hip Exercises: Aerobic   Nustep  L 4 x 6 min UE/LE      Knee/Hip Exercises: Standing   Forward Step Up  Left;10 reps;2 sets;Step Height: 4";Step Height: 6";Hand Hold: 2    Forward Step Up Limitations  2nd set x 5 on 6" step with 2 UE supprot      Knee/Hip Exercises: Seated   Long Arc Quad  Right;Left;Strengthening;10 reps    Long Arc Quad Weight  1 lbs.    Long Arc Quad Limitations  adduction ball     Hamstring Curl  20 reps;Right;Left;Strengthening    Hamstring Limitations  red TB at ankle    Sit to Sand  1 set;10 reps;with UE support   from mat table with cues to use hands pushoff less      Iontophoresis   Type of Iontophoresis  Dexamethasone   4#/6   Location  L anterior/lateral joint line of knee    Dose  1.29mL, 13mA-min  Time  4-6hr wear time                PT Short Term Goals - 10/30/17 0939      PT SHORT TERM GOAL #1   Title  Patient to be independent with initial HEP.    Time  3    Period  Weeks    Status  Achieved        PT Long Term Goals - 10/17/17 0802      PT LONG TERM GOAL #1   Title  Patient to be independent with advanced HEP.    Time  6    Period  Weeks    Status  On-going      PT LONG TERM GOAL #2   Title  Patient to demonstrate B LE >=4+/5 strength.    Time  6    Period  Weeks    Status  On-going      PT LONG TERM GOAL #3   Title  Patient to demonstrate L knee AROM/PROM without pain limiting and WFL.    Time  6    Period  Weeks    Status  On-going      PT LONG TERM GOAL #4   Title  Patient to report tolerance of 15 min of standing without pain limiting.     Time  6    Period  Weeks    Status  On-going            Plan - 11/08/17 1102    Clinical Impression Statement  Pt. doing well today.  Reports she has done water aerobics 4x this week and feels she is getting stronger with therapy noting ~ 70% overall improvement in L  knee pain since starting PT.     PT Treatment/Interventions  ADLs/Self Care Home Management;Cryotherapy;Iontophoresis 4mg /ml Dexamethasone;Moist Heat;Gait training;DME Instruction;Stair training;Functional mobility training;Therapeutic activities;Therapeutic exercise;Manual techniques;Patient/family education;Neuromuscular re-education;Balance training;Scar mobilization;Passive range of motion;Dry needling;Energy conservation;Splinting;Taping;Vasopneumatic Device    Consulted and Agree with Plan of Care  Patient       Patient will benefit from skilled therapeutic intervention in order to improve the following deficits and impairments:  Abnormal gait, Decreased endurance, Decreased activity tolerance, Decreased strength, Pain, Decreased mobility, Decreased balance, Difficulty walking, Improper body mechanics, Decreased range of motion, Impaired flexibility, Postural dysfunction  Visit Diagnosis: Acute pain of left knee  Stiffness of left knee, not elsewhere classified  Difficulty in walking, not elsewhere classified  Muscle weakness (generalized)  Abnormal posture     Problem List Patient Active Problem List   Diagnosis Date Noted  . Chronic pain of left knee 10/01/2017  . Lesion of skin of breast 12/12/2015  . Coccygeal contusion 05/02/2015  . Screening, ischemic heart disease 12/08/2014  . Hearing loss 12/08/2014  . Hyperlipidemia 06/04/2014  . Arthritis 04/15/2014  . Chronic allergic rhinitis 04/15/2014  . COPD (chronic obstructive pulmonary disease) ([place]) 04/15/2014  . Medicare annual wellness visit, subsequent 11/30/2013  . COPD with chronic bronchitis ([place]) 11/02/2013  . Osteopenia 11/02/2013  . Gait instability 11/02/2013    Bess Harvest, PTA 11/08/17 11:48 AM   [facility] [address]  [place], [place], [address] Phone: [phone]   Fax:  [phone]  Name: Tanya Harris MRN: [id number] Date of  Birth: [birth year]

## 2017-11-12 ENCOUNTER — Ambulatory Visit: Payer: Medicare Other | Admitting: Physical Therapy

## 2017-11-12 DIAGNOSIS — M6281 Muscle weakness (generalized): Secondary | ICD-10-CM

## 2017-11-12 DIAGNOSIS — M25562 Pain in left knee: Secondary | ICD-10-CM | POA: Diagnosis not present

## 2017-11-12 DIAGNOSIS — M25662 Stiffness of left knee, not elsewhere classified: Secondary | ICD-10-CM | POA: Diagnosis not present

## 2017-11-12 DIAGNOSIS — R293 Abnormal posture: Secondary | ICD-10-CM | POA: Diagnosis not present

## 2017-11-12 DIAGNOSIS — R262 Difficulty in walking, not elsewhere classified: Secondary | ICD-10-CM | POA: Diagnosis not present

## 2017-11-12 NOTE — Therapy (Signed)
[facility] [address]  [place], [place], [address] Phone: [phone]   Fax:  [phone]  Physical Therapy Treatment  Patient Details  Name: Tanya Harris MRN: [id number] Date of Birth: 09-19-1932 Referring Provider: Karlton Lemon, MD   Encounter Date: 11/12/2017  PT End of Session - 11/12/17 1106    Visit Number  9    Number of Visits  13    Date for PT Re-Evaluation  11/27/17    Authorization Type  Medicare    PT Start Time  1103    PT Stop Time  1150    PT Time Calculation (min)  47 min    Activity Tolerance  Patient tolerated treatment well    Behavior During Therapy  [facility] for tasks assessed/performed       Past Medical History:  Diagnosis Date  . COPD (chronic obstructive pulmonary disease) (HCC)    Mild  . DJD (degenerative joint disease) of knee   . Scoliosis   . Spinal stenosis     Past Surgical History:  Procedure Laterality Date  . APPENDECTOMY  2005  . CATARACT EXTRACTION     Bilateral  . NASAL SINUS SURGERY    . TONSILLECTOMY    . TOTAL KNEE ARTHROPLASTY  2001   Left    There were no vitals filed for this visit.     Progress Note Reporting Period 10/16/17 to 11/12/17  See note below for Objective Data and Assessment of Progress/Goals.      Subjective Assessment - 11/12/17 1106    Subjective  Patient reports she may have overdone it over the weekend with her pool activities so it's sore today.    Pertinent History  spinal stenosis, scoliosis, DJD of knee, L TKA, COPD    Limitations  Lifting;Standing;Walking;House hold activities;Sitting    How long can you sit comfortably?  inconsistent    How long can you stand comfortably?  < 5 min, better if holding onto rollator    How long can you walk comfortably?  < 5 min, better if holding onto rollator    Diagnostic tests  10/01/17: L knee xray: No acute or significant chronic bony abnormality of the left knee. There are soft tissue  calcifications in the suprapatellar region. 10/08/17 L hip xray: No evidence for acute abnormality. 10/09/17 L knee CT: 1. Left total knee arthroplasty without hardware failure     Patient Stated Goals  get rid of the pain    Currently in Pain?  Yes    Pain Score  2     Pain Location  Knee    Pain Orientation  Left    Pain Descriptors / Indicators  Aching;Sharp    Pain Type  Chronic pain    Pain Onset  More than a month ago    Pain Frequency  Intermittent    Aggravating Factors   standing and walking    Pain Relieving Factors  tylenol, ionto    Effect of Pain on Daily Activities  limited         [facility] PT Assessment - 11/12/17 0001      Strength   Right Hip Flexion  4/5    Right Hip ABduction  5/5   in sitting   Right Hip ADduction  5/5    Left Hip Flexion  4-/5    Left Hip ABduction  5/5   sitting   Left Hip ADduction  5/5    Right Knee Flexion  5/5    Right Knee Extension  5/5    Left Knee Flexion  4+/5    Left Knee Extension  5/5                   [facility] Adult PT Treatment/Exercise - 11/12/17 0001      Exercises   Exercises  Knee/Hip      Knee/Hip Exercises: Aerobic   Nustep  L4 x6 min      Knee/Hip Exercises: Standing   Forward Step Up  Right;2 sets;5 reps;Hand Hold: 2;Step Height: 6"      Knee/Hip Exercises: Seated   Marching  Strengthening;Both;4 sets;10 reps   yellow band   Sit to Sand  2 sets;5 reps;with UE support   off foam on mat; one UE assist intermittently     Knee/Hip Exercises: Supine   Bridges  Strengthening;Both;2 sets;10 reps    Bridges Limitations  left knee flexion limited    Straight Leg Raises  Strengthening;Left;1 set;10 reps             PT Education - 11/12/17 1158    Education Details  HEP    Person(s) Educated  Patient    Methods  Explanation;Demonstration;Handout    Comprehension  Verbalized understanding;Returned demonstration       PT Short Term Goals - 10/30/17 0939      PT SHORT TERM GOAL #1   Title   Patient to be independent with initial HEP.    Time  3    Period  Weeks    Status  Achieved        PT Long Term Goals - 11/12/17 1108      PT LONG TERM GOAL #1   Title  Patient to be independent with advanced HEP.    Time  6    Period  Weeks    Status  On-going      PT LONG TERM GOAL #2   Title  Patient to demonstrate B LE >=4+/5 strength.    Baseline  L knee ext 5/5, flex 5-/5, R knee 5/5    Time  6    Period  Weeks    Status  On-going      PT LONG TERM GOAL #3   Title  Patient to demonstrate L knee AROM/PROM without pain limiting and WFL.    Time  6    Period  Weeks    Status  Achieved      PT LONG TERM GOAL #4   Title  Patient to report tolerance of 15 min of standing without pain limiting.     Baseline  less than 5 min    Time  6    Period  Weeks    Status  On-going            Plan - 11/12/17 1201    Clinical Impression Statement  Patient is progressing well with strengthening but still shows functional weakness bil. She is able to tolerate WBing through LLE without knee pain in supine but not standing.  Patient encouraged to use RLE on stairs when safe.    Rehab Potential  Good    PT Frequency  2x / week    PT Duration  6 weeks    PT Treatment/Interventions  ADLs/Self Care Home Management;Cryotherapy;Iontophoresis 4mg /ml Dexamethasone;Moist Heat;Gait training;DME Instruction;Stair training;Functional mobility training;Therapeutic activities;Therapeutic exercise;Manual techniques;Patient/family education;Neuromuscular re-education;Balance training;Scar mobilization;Passive range of motion;Dry needling;Energy conservation;Splinting;Taping;Vasopneumatic Device    PT Next Visit Plan  continue strengthening, assess ankle strength  PT Home Exercise Plan  step ups, bridge, sit to stand    Consulted and Agree with Plan of Care  Patient       Patient will benefit from skilled therapeutic intervention in order to improve the following deficits and impairments:   Abnormal gait, Decreased endurance, Decreased activity tolerance, Decreased strength, Pain, Decreased mobility, Decreased balance, Difficulty walking, Improper body mechanics, Decreased range of motion, Impaired flexibility, Postural dysfunction  Visit Diagnosis: Acute pain of left knee  Muscle weakness (generalized)     Problem List Patient Active Problem List   Diagnosis Date Noted  . Chronic pain of left knee 10/01/2017  . Lesion of skin of breast 12/12/2015  . Coccygeal contusion 05/02/2015  . Screening, ischemic heart disease 12/08/2014  . Hearing loss 12/08/2014  . Hyperlipidemia 06/04/2014  . Arthritis 04/15/2014  . Chronic allergic rhinitis 04/15/2014  . COPD (chronic obstructive pulmonary disease) ([place]) 04/15/2014  . Medicare annual wellness visit, subsequent 11/30/2013  . COPD with chronic bronchitis ([place]) 11/02/2013  . Osteopenia 11/02/2013  . Gait instability 11/02/2013    Glorene Leitzke PT 11/12/2017, 12:17 PM  [facility] [address]  [place], [place], [address] Phone: [phone]   Fax:  [phone]  Name: Tanya Harris MRN: [id number] Date of Birth: 12-31-1932

## 2017-11-12 NOTE — Patient Instructions (Signed)
PELVIC STABILIZATION: Basic Bridge    Exhaling, lift hips. Hold for _2-3 seconds__. Exhaling, release hips back to floor. Repeat _10-20__ times. Do  2___ times per day.  Copyright  VHI. All rights reserved.    Sit to Stand    Sit on edge of chair, feet flat on floor. Stand upright, extending knees fully. Repeat 10 times per set. Do _1-3 sets per session. Do __1-2__ sessions per day.   Proprioception, Quad Strength, Timing, Coordination: Forward Step-Up    Move onto step right oot, then the other. Step back down with _left__ foot. Use ____ inch step. Repeat _10-30___ times or for ____ minutes. Do __1-2__ sessions per day.  [email or web address].[email or web address]   Madelyn Flavors, PT 11/12/17 11:47 AM

## 2017-11-14 ENCOUNTER — Encounter: Payer: Self-pay | Admitting: Physical Therapy

## 2017-11-14 ENCOUNTER — Ambulatory Visit: Payer: Medicare Other | Admitting: Physical Therapy

## 2017-11-14 DIAGNOSIS — R262 Difficulty in walking, not elsewhere classified: Secondary | ICD-10-CM

## 2017-11-14 DIAGNOSIS — M6281 Muscle weakness (generalized): Secondary | ICD-10-CM

## 2017-11-14 DIAGNOSIS — M25662 Stiffness of left knee, not elsewhere classified: Secondary | ICD-10-CM

## 2017-11-14 DIAGNOSIS — M25562 Pain in left knee: Secondary | ICD-10-CM | POA: Diagnosis not present

## 2017-11-14 DIAGNOSIS — R293 Abnormal posture: Secondary | ICD-10-CM | POA: Diagnosis not present

## 2017-11-14 NOTE — Therapy (Signed)
[facility] [address]  [place], [place], [address] Phone: [phone]   Fax:  [phone]  Physical Therapy Progress Note  Patient Details  Name: Tanya Harris MRN: [id number] Date of Birth: [birth year] Referring Provider: Karlton Lemon, MD   Progress Note Reporting Period 10/16/17 to 11/14/17  See note below for Objective Data and Assessment of Progress/Goals.    Encounter Date: 11/14/2017  PT End of Session - 11/14/17 1626    Visit Number  10    Number of Visits  13    Date for PT Re-Evaluation  11/27/17    Authorization Type  Medicare    PT Start Time  1307    PT Stop Time  1351    PT Time Calculation (min)  44 min    Equipment Utilized During Treatment  Gait belt    Activity Tolerance  Patient tolerated treatment well    Behavior During Therapy  WFL for tasks assessed/performed       Past Medical History:  Diagnosis Date  . COPD (chronic obstructive pulmonary disease) (HCC)    Mild  . DJD (degenerative joint disease) of knee   . Scoliosis   . Spinal stenosis     Past Surgical History:  Procedure Laterality Date  . APPENDECTOMY  2005  . CATARACT EXTRACTION     Bilateral  . NASAL SINUS SURGERY    . TONSILLECTOMY    . TOTAL KNEE ARTHROPLASTY  2001   Left    There were no vitals filed for this visit.  Subjective Assessment - 11/14/17 1308    Subjective  Reports pain has been down but still having more pain with WBing. Reports 60-70% improvement since starting POC. Notes she still isn't as strong or active as she was at [facility].     Pertinent History  spinal stenosis, scoliosis, DJD of knee, L TKA, COPD    Diagnostic tests  10/01/17: L knee xray: No acute or significant chronic bony abnormality of the left knee. There are soft tissue calcifications in the suprapatellar region. 10/08/17 L hip xray: No evidence for acute abnormality. 10/09/17 L knee CT: 1. Left total knee arthroplasty without  hardware failure     Patient Stated Goals  get rid of the pain    Currently in Pain?  Yes    Pain Score  2     Pain Location  Knee    Pain Orientation  Left    Pain Descriptors / Indicators  Aching;Sharp    Pain Type  Chronic pain         [facility] PT Assessment - 11/14/17 0001      Observation/Other Assessments   Focus on Therapeutic Outcomes (FOTO)   Knee: 43 (57% limited, 51% predicted)      AROM   AROM Assessment Site  Knee    Left Knee Extension  0    Left Knee Flexion  106      PROM   Left Knee Extension  0    Left Knee Flexion  115      Strength   Strength Assessment Site  Hip;Knee;Ankle    Right/Left Hip  Right;Left    Right Hip Flexion  4/5    Right Hip ABduction  4+/5    Right Hip ADduction  4+/5    Left Hip Flexion  4/5    Left Hip ABduction  4+/5    Left Hip ADduction  4+/5    Right/Left Knee  Right;Left  Right Knee Flexion  5/5    Right Knee Extension  5/5    Left Knee Flexion  5/5    Left Knee Extension  5/5    Right/Left Ankle  Right;Left    Right Ankle Dorsiflexion  4/5    Right Ankle Plantar Flexion  4+/5    Left Ankle Dorsiflexion  4+/5    Left Ankle Plantar Flexion  4+/5                   [facility] Adult PT Treatment/Exercise - 11/14/17 0001      Knee/Hip Exercises: Aerobic   Nustep  L3 x6 min      Knee/Hip Exercises: Standing   Forward Step Up  Right;2 sets;5 reps;Hand Hold: 2;Step Height: 6"    Forward Step Up Limitations  walker support on L, HHA on R   cues to decrease speed and upright trunk   Other Standing Knee Exercises  side stepping along counter top 4x length of counter top   cues for upright trunk and toes facing forward   Other Standing Knee Exercises  standing HS curl with 1# at counter top 10x each LE      Knee/Hip Exercises: Seated   Sit to Sand  2 sets;5 reps;with UE support   1 foam pad on mat; cues for ant trunk lean and foot placemen     Iontophoresis   Type of Iontophoresis  Dexamethasone   #5/6    Location  L anterior/lateral joint line of knee    Dose  1.65m, 840mmin    Time  4-6hr wear time                PT Short Term Goals - 11/14/17 1313      PT SHORT TERM GOAL #1   Title  Patient to be independent with initial HEP.    Time  3    Period  Weeks    Status  Achieved        PT Long Term Goals - 11/14/17 1314      PT LONG TERM GOAL #1   Title  Patient to be independent with advanced HEP.    Time  6    Period  Weeks    Status  On-going   met for current     PT LONG TERM GOAL #2   Title  Patient to demonstrate B LE >=4+/5 strength.    Baseline  --    Time  6    Period  Weeks    Status  Partially Met   improvements noted in B hip ABD, ADD, R hip flex, B knee flex/ex     PT LONG TERM GOAL #3   Title  Patient to demonstrate L knee AROM/PROM without pain limiting and WFL.    Time  6    Period  Weeks    Status  On-going   AROM 0-106, PROM 0-115     PT LONG TERM GOAL #4   Title  Patient to report tolerance of 15 min of standing without pain limiting.     Baseline  less than 5 min    Time  6    Period  Weeks    Status  On-going   reports about 3 minutes of standing before pain/fatigue sets in           Plan - 11/14/17 1627    Clinical Impression Statement  Patient arrived with no new complaints. Reporting 60-70% improvement in L knee  since initial eval. Notes improvement in pain levels, however mentions that she still isn't as strong as she was in [facility]. Updated goals- patient has shown improvements in B hip ABD, ADD, R hip flex, B knee flex/ex strength. L knee flexion PROM improved this date. Still limited in standing tolerance at this time d/t pain and fatigue. Worked on STS transfers with B HHA to encourage anterior trunk lean and anterior step ups with B UE support and cues to decreased speed and maintain upright trunk. Patient apprehensive with both of these activities d/t fear of falling. Requested ionto patch #5/6 to L knee for pain relief.  Patient aware of wear time and precautions. Ended session without increased pain. Patient showing good progress with PT thus far, plan to wrap up within coming visits. Patient in agreement.     PT Treatment/Interventions  ADLs/Self Care Home Management;Cryotherapy;Iontophoresis 74m/ml Dexamethasone;Moist Heat;Gait training;DME Instruction;Stair training;Functional mobility training;Therapeutic activities;Therapeutic exercise;Manual techniques;Patient/family education;Neuromuscular re-education;Balance training;Scar mobilization;Passive range of motion;Dry needling;Energy conservation;Splinting;Taping;Vasopneumatic Device    Consulted and Agree with Plan of Care  Patient       Patient will benefit from skilled therapeutic intervention in order to improve the following deficits and impairments:  Abnormal gait, Decreased endurance, Decreased activity tolerance, Decreased strength, Pain, Decreased mobility, Decreased balance, Difficulty walking, Improper body mechanics, Decreased range of motion, Impaired flexibility, Postural dysfunction  Visit Diagnosis: Acute pain of left knee  Muscle weakness (generalized)  Stiffness of left knee, not elsewhere classified  Difficulty in walking, not elsewhere classified  Abnormal posture     Problem List Patient Active Problem List   Diagnosis Date Noted  . Chronic pain of left knee 10/01/2017  . Lesion of skin of breast 12/12/2015  . Coccygeal contusion 05/02/2015  . Screening, ischemic heart disease 12/08/2014  . Hearing loss 12/08/2014  . Hyperlipidemia 06/04/2014  . Arthritis 04/15/2014  . Chronic allergic rhinitis 04/15/2014  . COPD [place] obstructive pulmonary disease) ([place] 04/15/2014  . Medicare annual wellness visit, subsequent 11/30/2013  . COPD with chronic bronchitis ([place] 11/02/2013  . Osteopenia 11/02/2013  . Gait instability 11/02/2013    YJanene Harvey PT, DPT 11/14/17 4:39 PM   [facility] [place] [address]Phone: [phone]  Fax:  [phone] Name: IParisha BeaulacMRN: [id number] of Birth: 706-May-1934

## 2017-11-18 ENCOUNTER — Encounter: Payer: Self-pay | Admitting: Physical Therapy

## 2017-11-18 ENCOUNTER — Encounter: Payer: Self-pay | Admitting: Family Medicine

## 2017-11-18 ENCOUNTER — Ambulatory Visit (INDEPENDENT_AMBULATORY_CARE_PROVIDER_SITE_OTHER): Payer: Medicare Other | Admitting: Family Medicine

## 2017-11-18 ENCOUNTER — Ambulatory Visit: Payer: Medicare Other | Admitting: Physical Therapy

## 2017-11-18 VITALS — BP 112/71 | HR 76 | Ht 62.0 in | Wt 149.0 lb

## 2017-11-18 DIAGNOSIS — M25662 Stiffness of left knee, not elsewhere classified: Secondary | ICD-10-CM

## 2017-11-18 DIAGNOSIS — M6281 Muscle weakness (generalized): Secondary | ICD-10-CM

## 2017-11-18 DIAGNOSIS — R262 Difficulty in walking, not elsewhere classified: Secondary | ICD-10-CM | POA: Diagnosis not present

## 2017-11-18 DIAGNOSIS — M25562 Pain in left knee: Secondary | ICD-10-CM

## 2017-11-18 DIAGNOSIS — G8929 Other chronic pain: Secondary | ICD-10-CM

## 2017-11-18 DIAGNOSIS — R293 Abnormal posture: Secondary | ICD-10-CM

## 2017-11-18 MED ORDER — DICLOFENAC SODIUM 1 % TD GEL: 2.0000 g | Freq: Four times a day (QID) | TRANSDERMAL | 1 refills | Status: DC

## 2017-11-18 NOTE — Patient Instructions (Addendum)
You're doing much better. Icing if needed 15 minutes at a time 3-4 times a day. Continue the tylenol. Voltaren gel up to 4 times a day - fill the script if pain is severe. Keep follow-up with Dr. Wynelle Link. Follow up with me as needed.

## 2017-11-18 NOTE — Therapy (Signed)
[facility] [address]  [place], [place], [address] Phone: [phone]   Fax:  [phone]  Physical Therapy Treatment  Patient Details  Name: Tanya Harris MRN: [id number] Date of Birth: [birth year] Referring Provider: Karlton Lemon, MD   Encounter Date: 11/18/2017  PT End of Session - 11/18/17 1015    Visit Number  11    Number of Visits  13    Date for PT Re-Evaluation  11/27/17    Authorization Type  Medicare    PT Start Time  0930    PT Stop Time  1010    PT Time Calculation (min)  40 min    Equipment Utilized During Treatment  Gait belt    Activity Tolerance  Patient tolerated treatment well    Behavior During Therapy  [facility] for tasks assessed/performed       Past Medical History:  Diagnosis Date  . COPD (chronic obstructive pulmonary disease) (HCC)    Mild  . DJD (degenerative joint disease) of knee   . Scoliosis   . Spinal stenosis     Past Surgical History:  Procedure Laterality Date  . APPENDECTOMY  2005  . CATARACT EXTRACTION     Bilateral  . NASAL SINUS SURGERY    . TONSILLECTOMY    . TOTAL KNEE ARTHROPLASTY  2001   Left    There were no vitals filed for this visit.  Subjective Assessment - 11/18/17 0932    Subjective  Reports that she feels good today. Still has some pain but not like it was. Would like to wrap up with PT at her next visit.     Pertinent History  spinal stenosis, scoliosis, DJD of knee, L TKA, COPD    Diagnostic tests  10/01/17: L knee xray: No acute or significant chronic bony abnormality of the left knee. There are soft tissue calcifications in the suprapatellar region. 10/08/17 L hip xray: No evidence for acute abnormality. 10/09/17 L knee CT: 1. Left total knee arthroplasty without hardware failure     Patient Stated Goals  get rid of the pain    Currently in Pain?  Yes    Pain Score  2     Pain Location  Knee    Pain Orientation  Left    Pain Descriptors /  Indicators  Aching;Sharp    Pain Type  Chronic pain                       [facility] Adult PT Treatment/Exercise - 11/18/17 0001      Knee/Hip Exercises: Standing   Terminal Knee Extension  Left;15 reps   B chair support; also attempted TKE w/ ball against counter    Theraband Level (Terminal Knee Extension)  Level 4 (Blue)    Forward Step Up  Right;2 sets;5 reps;Hand Hold: 2;Step Height: 6"    Other Standing Knee Exercises  side stepping along counter top 4x length of counter top   unable to tolerate with yellow TB around toes   Other Standing Knee Exercises  standing marhcing with yellow TB around toes x20 at counter top      Knee/Hip Exercises: Seated   Hamstring Curl  Right;Left;Strengthening;15 reps    Hamstring Limitations  green TB      Knee/Hip Exercises: Supine   Other Supine Knee/Hip Exercises  bridge with B LEs on pball 2x10   assistance keeping LEs on ball     Knee/Hip Exercises: Sidelying  Clams  15x each LE with red TB             PT Education - 11/18/17 1015    Education Details  update to HEP    Person(s) Educated  Patient    Methods  Explanation;Demonstration;Tactile cues;Verbal cues;Handout    Comprehension  Returned demonstration;Verbalized understanding       PT Short Term Goals - 11/14/17 1313      PT SHORT TERM GOAL #1   Title  Patient to be independent with initial HEP.    Time  3    Period  Weeks    Status  Achieved        PT Long Term Goals - 11/14/17 1314      PT LONG TERM GOAL #1   Title  Patient to be independent with advanced HEP.    Time  6    Period  Weeks    Status  On-going   met for current     PT LONG TERM GOAL #2   Title  Patient to demonstrate B LE >=4+/5 strength.    Baseline  --    Time  6    Period  Weeks    Status  Partially Met   improvements noted in B hip ABD, ADD, R hip flex, B knee flex/ex     PT LONG TERM GOAL #3   Title  Patient to demonstrate L knee AROM/PROM without pain limiting and  WFL.    Time  6    Period  Weeks    Status  On-going   AROM 0-106, PROM 0-115     PT LONG TERM GOAL #4   Title  Patient to report tolerance of 15 min of standing without pain limiting.     Baseline  less than 5 min    Time  6    Period  Weeks    Status  On-going   reports about 3 minutes of standing before pain/fatigue sets in           Plan - 11/18/17 1015    Clinical Impression Statement  Patient arrived to session with no new complaints. Requesting to wrap up with PT next visit as her husband is ill and requiring several MD appointments. Progressed TB resistance for seated HS curl as patient reporting red TB has become easy. Administered green TB for home use. Patient with difficulty performing supine quad sets, better able to perform standing TKE with ball on counter with walker in front. Updated HEP with this exercise and answered all questions. Patient quickly fatigues with anterior step up on R LE, requiring increasing UE assistance with subsequent reps. Advised patient to use B UE support and avoid performing this exercise to exhaustion to decrease fall risk. Patient agreeable. Plan to perform final HEP review and transition to HEP next session.     PT Treatment/Interventions  ADLs/Self Care Home Management;Cryotherapy;Iontophoresis 83m/ml Dexamethasone;Moist Heat;Gait training;DME Instruction;Stair training;Functional mobility training;Therapeutic activities;Therapeutic exercise;Manual techniques;Patient/family education;Neuromuscular re-education;Balance training;Scar mobilization;Passive range of motion;Dry needling;Energy conservation;Splinting;Taping;Vasopneumatic Device    Consulted and Agree with Plan of Care  Patient       Patient will benefit from skilled therapeutic intervention in order to improve the following deficits and impairments:  Abnormal gait, Decreased endurance, Decreased activity tolerance, Decreased strength, Pain, Decreased mobility, Decreased balance,  Difficulty walking, Improper body mechanics, Decreased range of motion, Impaired flexibility, Postural dysfunction  Visit Diagnosis: Acute pain of left knee  Muscle weakness (generalized)  Stiffness of left knee,  not elsewhere classified  Difficulty in walking, not elsewhere classified  Abnormal posture     Problem List Patient Active Problem List   Diagnosis Date Noted  . Chronic pain of left knee 10/01/2017  . Lesion of skin of breast 12/12/2015  . Coccygeal contusion 05/02/2015  . Screening, ischemic heart disease 12/08/2014  . Hearing loss 12/08/2014  . Hyperlipidemia 06/04/2014  . Arthritis 04/15/2014  . Chronic allergic rhinitis 04/15/2014  . COPD (chronic obstructive pulmonary disease) ([place]) 04/15/2014  . Medicare annual wellness visit, subsequent 11/30/2013  . COPD with chronic bronchitis ([place]) 11/02/2013  . Osteopenia 11/02/2013  . Gait instability 11/02/2013    Janene Harvey, PT, DPT 11/18/17 10:22 AM   [facility] [address]  [place], [place], [address] Phone: [phone]   Fax:  [phone]  Name: Tanya Harris MRN: [id number] Date of Birth: 1932/06/18

## 2017-11-18 NOTE — Progress Notes (Signed)
PCP: Copland, Gay Filler, MD  Subjective:   HPI: Patient is a 84 y.o. female here for left knee pain.  7/30: Patient reports she had some pain anterior left knee about a month ago but improved. Then on Saturday she developed more severe pain anteriorly, worse with knee flexion. No swelling, fever, injury. Pain 5/10 but up to 10/10 and sharp at its worst. She has been icing all weekend which has helped and uses neutralife also. Had knee replacement in 2001 of this knee. No skin changes.  She has neuropathy of this lower leg but no change in numbness.  8/6: Patient returns stating she's struggling with left knee pain. She's taking tylenol (2 today so far), meloxicam, aspercreme, ice. Icing seems to help the best out of anything. Still with fairly severe 8/10 anterior left knee pain. No new injuries. No skin changes.  9/16: Patient returns for follow-up reporting improved knee pain.  Currently 2/10 pain.  Her pain which was previously around the patella has improved.  She now has more diffuse pain which is worse with walking.  She has been doing physical therapy and overall feels stronger.  She believes she has been getting good results with iontophoresis.  She is also taking meloxicam and Tylenol as needed for pain.  She denies any swelling or feeling of instability. No skin changes.  Past Medical History:  Diagnosis Date  . COPD (chronic obstructive pulmonary disease) (HCC)    Mild  . DJD (degenerative joint disease) of knee   . Scoliosis   . Spinal stenosis     Current Outpatient Medications on File Prior to Visit  Medication Sig Dispense Refill  . aspirin 81 MG tablet Take 81 mg by mouth daily.    . Calcium Carbonate-Vitamin D (CALCIUM 600+D) 600-400 MG-UNIT per tablet Take 2 tablets by mouth daily.    . clindamycin (CLEOCIN) 300 MG capsule Take 600 mg by mouth once prior to dental procedure 10 capsule 0  . fluticasone (FLONASE) 50 MCG/ACT nasal spray Place 2 sprays into both  nostrils daily. 16 g 5  . Fluticasone-Salmeterol (ADVAIR) 250-50 MCG/DOSE AEPB Inhale 1 puff into the lungs 2 (two) times daily. 60 each 5  . glucosamine-chondroitin 500-400 MG tablet Take 1 tablet by mouth daily.     . meloxicam (MOBIC) 15 MG tablet Take 1 tablet (15 mg total) by mouth daily as needed for pain. 30 tablet 1  . Multiple Vitamin (MULTIVITAMIN) tablet Take 1 tablet by mouth daily.    Marland Kitchen omega-3 fish oil (MAXEPA) 1000 MG CAPS capsule Take 2 capsules by mouth daily. TAKING 1200mg  DAILY     No current facility-administered medications on file prior to visit.     Past Surgical History:  Procedure Laterality Date  . APPENDECTOMY  2005  . CATARACT EXTRACTION     Bilateral  . NASAL SINUS SURGERY    . TONSILLECTOMY    . TOTAL KNEE ARTHROPLASTY  2001   Left    Allergies  Allergen Reactions  . Amoxicillin Nausea And Vomiting  . Ibuprofen Swelling    Mouth Swelling- however pt confirms that she is able to take mobic with no problems 11/2016     Social History   Socioeconomic History  . Marital status: Married    Spouse name: Not on file  . Number of children: Not on file  . Years of education: Not on file  . Highest education level: Not on file  Occupational History  . Not on file  Social  Needs  . Financial resource strain: Not on file  . Food insecurity:    Worry: Not on file    Inability: Not on file  . Transportation needs:    Medical: Not on file    Non-medical: Not on file  Tobacco Use  . Smoking status: Never Smoker  . Smokeless tobacco: Never Used  Substance and Sexual Activity  . Alcohol use: No  . Drug use: Yes  . Sexual activity: Not on file  Lifestyle  . Physical activity:    Days per week: Not on file    Minutes per session: Not on file  . Stress: Not on file  Relationships  . Social connections:    Talks on phone: Not on file    Gets together: Not on file    Attends religious service: Not on file    Active member of club or organization:  Not on file    Attends meetings of clubs or organizations: Not on file    Relationship status: Not on file  . Intimate partner violence:    Fear of current or ex partner: Not on file    Emotionally abused: Not on file    Physically abused: Not on file    Forced sexual activity: Not on file  Other Topics Concern  . Not on file  Social History Narrative  . Not on file    Family History  Problem Relation Age of Onset  . Heart disease Mother 63       Deceased  . Diabetes Mother   . Heart disease Father 21       Deceased  . Asthma Father   . Heart disease Maternal Uncle   . Colon cancer Brother   . Diabetes Brother        #2  . Heart disease Brother        x2  . Healthy Sister   . Healthy Brother        x1  . Hyperlipidemia Son        x2    BP 112/71   Pulse 76   Ht 5\' 2"  (1.575 m)   Wt 149 lb (67.6 kg)   BMI 27.25 kg/m   Review of Systems: See HPI above.     Objective:  Physical Exam:  GEN: aaox4, NAD  Left knee: - Inspection: no gross deformity.  Well-healed anterior scar from TKA. No swelling/effusion, erythema or bruising. Skin intact - Palpation: no TTP - ROM: full active ROM with flexion and extension in knee without pain - Strength: 5/5 strength - Neuro/vasc: NV intact - Special Tests: - LIGAMENTS: no MCL or LCL laxity   Hips: no pain with IR/ER. Neg logroll  Assessment & Plan:  1. Left knee pain - s/p TKA 18 years ago. Overall she is improving. - pt will see ortho surgery in November - continue PT - ice 45min 3-4 times per day as needed - will prescribe voltaren gel 4 times daily as needed - pt provided with script - follow up as needed

## 2017-11-22 ENCOUNTER — Ambulatory Visit: Payer: Medicare Other

## 2017-11-22 DIAGNOSIS — M25662 Stiffness of left knee, not elsewhere classified: Secondary | ICD-10-CM | POA: Diagnosis not present

## 2017-11-22 DIAGNOSIS — M6281 Muscle weakness (generalized): Secondary | ICD-10-CM

## 2017-11-22 DIAGNOSIS — M25562 Pain in left knee: Secondary | ICD-10-CM | POA: Diagnosis not present

## 2017-11-22 DIAGNOSIS — R262 Difficulty in walking, not elsewhere classified: Secondary | ICD-10-CM

## 2017-11-22 DIAGNOSIS — R293 Abnormal posture: Secondary | ICD-10-CM | POA: Diagnosis not present

## 2017-11-22 NOTE — Therapy (Addendum)
[facility] [address]  [place], [place], [address] Phone: [phone]   Fax:  [phone]  Physical Therapy Treatment  Patient Details  Name: Tanya Harris MRN: [id number] Date of Birth: [birth year] Referring Provider: Karlton Lemon, MD   Progress Note Reporting Period 11/18/17 to 11/22/17  See note below for Objective Data and Assessment of Progress/Goals.    Encounter Date: 11/22/2017  PT End of Session - 11/22/17 0943    Visit Number  12    Number of Visits  13    Date for PT Re-Evaluation  11/27/17    Authorization Type  Medicare    PT Start Time  0933    PT Stop Time  1015    PT Time Calculation (min)  42 min    Activity Tolerance  Patient tolerated treatment well    Behavior During Therapy  [facility] for tasks assessed/performed       Past Medical History:  Diagnosis Date  . COPD (chronic obstructive pulmonary disease) (HCC)    Mild  . DJD (degenerative joint disease) of knee   . Scoliosis   . Spinal stenosis     Past Surgical History:  Procedure Laterality Date  . APPENDECTOMY  2005  . CATARACT EXTRACTION     Bilateral  . NASAL SINUS SURGERY    . TONSILLECTOMY    . TOTAL KNEE ARTHROPLASTY  2001   Left    There were no vitals filed for this visit.  Subjective Assessment - 11/22/17 0942    Subjective  Pt. doing well today and verbalizes the wishes to continue with transition to home program.      Pertinent History  spinal stenosis, scoliosis, DJD of knee, L TKA, COPD    Diagnostic tests  10/01/17: L knee xray: No acute or significant chronic bony abnormality of the left knee. There are soft tissue calcifications in the suprapatellar region. 10/08/17 L hip xray: No evidence for acute abnormality. 10/09/17 L knee CT: 1. Left total knee arthroplasty without hardware failure     Patient Stated Goals  get rid of the pain    Currently in Pain?  Yes    Pain Score  3     Pain Location  Knee     Pain Orientation  Left    Pain Descriptors / Indicators  Aching;Sharp    Pain Type  Chronic pain    Pain Onset  More than a month ago    Pain Frequency  Intermittent    Aggravating Factors   standing and walking     Multiple Pain Sites  No         [facility] PT Assessment - 11/22/17 0947      Observation/Other Assessments   Focus on Therapeutic Outcomes (FOTO)   Knee: 43 (57% limited, 51% predicted)      AROM   AROM Assessment Site  Knee    Left Knee Extension  0    Left Knee Flexion  116      PROM   Left Knee Extension  0    Left Knee Flexion  118      Strength   Strength Assessment Site  Hip;Knee;Ankle    Right/Left Hip  Right;Left    Right Hip Flexion  4/5    Right Hip ABduction  4+/5    Right Hip ADduction  4+/5    Left Hip Flexion  4/5    Left Hip ABduction  4+/5    Left  Hip ADduction  4+/5    Right/Left Knee  Right;Left    Right Knee Flexion  5/5    Right Knee Extension  5/5    Left Knee Flexion  5/5    Left Knee Extension  5/5    Right/Left Ankle  Right;Left    Right Ankle Dorsiflexion  4+/5    Right Ankle Plantar Flexion  4+/5    Left Ankle Dorsiflexion  4+/5    Left Ankle Plantar Flexion  4+/5                   [facility] Adult PT Treatment/Exercise - 11/22/17 1216      Neuro Re-ed    Neuro Re-ed Details   Reviewed comprehensive HEP to check for appropriateness of activiteis and need for upgrade of band resistance       Knee/Hip Exercises: Aerobic   Nustep  L4 x6 min      Iontophoresis   Type of Iontophoresis  Dexamethasone   #6/6   Location  L anterior/lateral joint line of knee    Dose  1.37m, 857mmin    Time  4-6hr wear time                PT Short Term Goals - 11/14/17 1313      PT SHORT TERM GOAL #1   Title  Patient to be independent with initial HEP.    Time  3    Period  Weeks    Status  Achieved        PT Long Term Goals - 11/22/17 0943      PT LONG TERM GOAL #1   Title  Patient to be independent with advanced  HEP.    Time  6    Period  Weeks    Status  Achieved      PT LONG TERM GOAL #2   Title  Patient to demonstrate B LE >=4+/5 strength.    Time  6    Period  Weeks    Status  Partially Met      PT LONG TERM GOAL #3   Title  Patient to demonstrate L knee AROM/PROM without pain limiting and WFL.    Time  6    Period  Weeks    Status  Partially Met   AROM 0-116 dg, PROM 0-118 dg however no pain limiting      PT LONG TERM GOAL #4   Title  Patient to report tolerance of 15 min of standing without pain limiting.     Baseline  less than 5 min    Time  6    Period  Weeks    Status  On-going   reports about 3 minutes of standing before pain/fatigue sets in           Plan - 11/22/17 0944    Clinical Impression Statement  [name] noting she wishes to transition to home program from PT following today's visit.  Supervising PT approving, this plan and 30-day hold discussed with pt. with pt. agreeing to this.  Reviewed comprehensive HEP to check for understanding and update with pt. verbalizing understanding.  Pt. able to achieve or partially achieve all therapy goals with exception of standing tolerance goal now only able to stand for 3 min due to knee pain.  Pt. now on 30-day hold from therapy.      PT Treatment/Interventions  ADLs/Self Care Home Management;Cryotherapy;Iontophoresis 45m51ml Dexamethasone;Moist Heat;Gait training;DME Instruction;Stair training;Functional mobility training;Therapeutic activities;Therapeutic exercise;Manual techniques;Patient/family  education;Neuromuscular re-education;Balance training;Scar mobilization;Passive range of motion;Dry needling;Energy conservation;Splinting;Taping;Vasopneumatic Device    PT Next Visit Plan  30-day hold     Consulted and Agree with Plan of Care  Patient       Patient will benefit from skilled therapeutic intervention in order to improve the following deficits and impairments:  Abnormal gait, Decreased endurance, Decreased activity  tolerance, Decreased strength, Pain, Decreased mobility, Decreased balance, Difficulty walking, Improper body mechanics, Decreased range of motion, Impaired flexibility, Postural dysfunction  Visit Diagnosis: Acute pain of left knee  Muscle weakness (generalized)  Stiffness of left knee, not elsewhere classified  Difficulty in walking, not elsewhere classified  Abnormal posture     Problem List Patient Active Problem List   Diagnosis Date Noted  . Chronic pain of left knee 10/01/2017  . Lesion of skin of breast 12/12/2015  . Coccygeal contusion 05/02/2015  . Screening, ischemic heart disease 12/08/2014  . Hearing loss 12/08/2014  . Hyperlipidemia 06/04/2014  . Arthritis 04/15/2014  . Chronic allergic rhinitis 04/15/2014  . COPD (chronic obstructive pulmonary disease) ([place]) 04/15/2014  . Medicare annual wellness visit, subsequent 11/30/2013  . COPD with chronic bronchitis ([place]) 11/02/2013  . Osteopenia 11/02/2013  . Gait instability 11/02/2013    Bess Harvest, PTA 11/22/17 12:26 PM  [facility] [address]  [place], [place], [address] Phone: [phone]   Fax:  [phone]  Name: Tanya Harris MRN: [id number] Date of Birth: [birth year]  PHYSICAL THERAPY DISCHARGE SUMMARY  Visits from Start of Care: 12  Current functional level related to goals / functional outcomes: See above clinical summary; patient pleased with progress and did not return after being placed on 30 day hold   Remaining deficits: Decreased strength, knee ROM, walking tolerance   Education / Equipment: HEP  Plan: Patient agrees to discharge.  Patient goals were partially met. Patient is being discharged due to being pleased with the current functional level.  ?????     Janene Harvey, PT, DPT 12/23/17 10:11 AM

## 2017-12-04 DIAGNOSIS — Z789 Other specified health status: Secondary | ICD-10-CM | POA: Insufficient documentation

## 2017-12-09 ENCOUNTER — Ambulatory Visit (INDEPENDENT_AMBULATORY_CARE_PROVIDER_SITE_OTHER): Payer: Medicare Other

## 2017-12-09 DIAGNOSIS — Z23 Encounter for immunization: Secondary | ICD-10-CM | POA: Diagnosis not present

## 2017-12-15 ENCOUNTER — Encounter: Payer: Self-pay | Admitting: Family Medicine

## 2017-12-18 ENCOUNTER — Ambulatory Visit (INDEPENDENT_AMBULATORY_CARE_PROVIDER_SITE_OTHER): Payer: Medicare Other | Admitting: Family Medicine

## 2017-12-18 ENCOUNTER — Other Ambulatory Visit: Payer: Self-pay | Admitting: Family Medicine

## 2017-12-18 ENCOUNTER — Encounter: Payer: Self-pay | Admitting: Family Medicine

## 2017-12-18 ENCOUNTER — Ambulatory Visit: Payer: PRIVATE HEALTH INSURANCE | Admitting: *Deleted

## 2017-12-18 VITALS — BP 110/68 | HR 86 | Temp 97.7°F | Resp 16 | Ht 62.0 in | Wt 152.0 lb

## 2017-12-18 DIAGNOSIS — J449 Chronic obstructive pulmonary disease, unspecified: Secondary | ICD-10-CM

## 2017-12-18 DIAGNOSIS — M1991 Primary osteoarthritis, unspecified site: Secondary | ICD-10-CM

## 2017-12-18 DIAGNOSIS — Z5181 Encounter for therapeutic drug level monitoring: Secondary | ICD-10-CM

## 2017-12-18 DIAGNOSIS — N858 Other specified noninflammatory disorders of uterus: Secondary | ICD-10-CM | POA: Diagnosis not present

## 2017-12-18 DIAGNOSIS — R1900 Intra-abdominal and pelvic swelling, mass and lump, unspecified site: Secondary | ICD-10-CM | POA: Diagnosis not present

## 2017-12-18 DIAGNOSIS — R682 Dry mouth, unspecified: Secondary | ICD-10-CM | POA: Diagnosis not present

## 2017-12-18 DIAGNOSIS — M7582 Other shoulder lesions, left shoulder: Secondary | ICD-10-CM

## 2017-12-18 DIAGNOSIS — IMO0001 Reserved for inherently not codable concepts without codable children: Secondary | ICD-10-CM

## 2017-12-18 DIAGNOSIS — R739 Hyperglycemia, unspecified: Secondary | ICD-10-CM

## 2017-12-18 DIAGNOSIS — R638 Other symptoms and signs concerning food and fluid intake: Secondary | ICD-10-CM

## 2017-12-18 DIAGNOSIS — Z131 Encounter for screening for diabetes mellitus: Secondary | ICD-10-CM | POA: Diagnosis not present

## 2017-12-18 DIAGNOSIS — R911 Solitary pulmonary nodule: Secondary | ICD-10-CM | POA: Diagnosis not present

## 2017-12-18 DIAGNOSIS — R634 Abnormal weight loss: Secondary | ICD-10-CM

## 2017-12-18 LAB — GLUCOSE, POCT (MANUAL RESULT ENTRY): POC GLUCOSE: 99 mg/dL (ref 70–99)

## 2017-12-18 MED ORDER — MELOXICAM 15 MG PO TABS: 15.0000 mg | ORAL_TABLET | Freq: Every day | ORAL | 1 refills | Status: DC | PRN

## 2017-12-18 MED ORDER — FLUTICASONE-SALMETEROL 250-50 MCG/DOSE IN AEPB: 1.0000 | INHALATION_SPRAY | Freq: Two times a day (BID) | RESPIRATORY_TRACT | 5 refills | Status: DC

## 2017-12-18 NOTE — Patient Instructions (Addendum)
You can have the shingles shot done at your convenience at your drug store  Please try some of the shoulder exercises that we went over today- however if not helpful in a few weeks let me know I am going to set up a CT scan of your abdomen to rule out any mass in your abdomen for you

## 2017-12-18 NOTE — Progress Notes (Addendum)
[facility] [address], [place],  [address] [phone]  Date:  12/18/2017   Name:  Tanya Harris   DOB:  [birth year]   MRN:  [id number]  PCP:  Darreld Mclean, MD    Chief Complaint: Back Pain (back/side pain, hit it on a car door, 1 week, feels like there is a know); Shoulder Pain (left shoulder, several months ago); and Dry Mouth (going on several months, extreme thrist, trouble sleeping)   History of Present Illness:  Tanya Harris is a 84 y.o. very pleasant female patient who presents with the following:  Pt with history of COPD, arthritis, hyperlipidemia Here today with concern of a few things  She got hit in the left flank when her car door swung shot and hit her- this occurred about a week ago This area is still sore and she wanted to make sure it is ok, it feels like there is a bump there.  She has significant scoliosis which causes some bulging of her sides and abdomen.  Pt and her husband (who is present at visit) both wonder if she may have a mass of some sort in her left abdomen as this area seems more prominent lately  She is having a hard time sleeping due to anxiety over the last few days- she is worried about her health Her left shoulder has hurt for several months She does not recall any injury here It hurts her more at night when supine Internal rotation is painful- she notes it when she tries to put on her bra Finally she has noted a dry mouth for the last few months, and more urination   I last saw her in August with knee pain - she has been seeing sports med for this and is is getting better She is seeing ortho for her knee in November- she is seeing [facility], thought she would keep appt although her knee is improved [place] did help   She is taking meloxicam as needed for her joint pains- she may take this about 2x a week No CP or  SOB  She also notes chronic dry mouth, she often feels thirsty and has increased urination   Wt Readings from Last 3 Encounters:  12/18/17 152 lb (68.9 kg)  11/18/17 149 lb (67.6 kg)  10/16/17 157 lb (71.2 kg)    Patient Active Problem List   Diagnosis Date Noted  . Chronic pain of left knee 10/01/2017  . Lesion of skin of breast 12/12/2015  . Coccygeal contusion 05/02/2015  . Screening, ischemic heart disease 12/08/2014  . Hearing loss 12/08/2014  . Hyperlipidemia 06/04/2014  . Arthritis 04/15/2014  . Chronic allergic rhinitis 04/15/2014  . COPD (chronic obstructive pulmonary disease) ([place]) 04/15/2014  . Medicare annual wellness visit, subsequent 11/30/2013  . COPD with chronic bronchitis ([place]) 11/02/2013  . Osteopenia 11/02/2013  . Gait instability 11/02/2013    Past Medical History:  Diagnosis Date  . COPD (chronic obstructive pulmonary disease) (HCC)    Mild  . DJD (degenerative joint disease) of knee   . Scoliosis   . Spinal stenosis     Past Surgical History:  Procedure Laterality Date  . APPENDECTOMY  2005  . CATARACT EXTRACTION     Bilateral  . NASAL SINUS SURGERY    . TONSILLECTOMY    . TOTAL KNEE ARTHROPLASTY  2001   Left  Social History   Tobacco Use  . Smoking status: Never Smoker  . Smokeless tobacco: Never Used  Substance Use Topics  . Alcohol use: No  . Drug use: Yes    Family History  Problem Relation Age of Onset  . Heart disease Mother 74       Deceased  . Diabetes Mother   . Heart disease Father 30       Deceased  . Asthma Father   . Heart disease Maternal Uncle   . Colon cancer Brother   . Diabetes Brother        #2  . Heart disease Brother        x2  . Healthy Sister   . Healthy Brother        x1  . Hyperlipidemia Son        x2    Allergies  Allergen Reactions  . Amoxicillin Nausea And Vomiting  . Ibuprofen Swelling    Mouth Swelling- however pt confirms that she is able to take mobic with no problems 11/2016      Medication list has been reviewed and updated.  Current Outpatient Medications on File Prior to Visit  Medication Sig Dispense Refill  . aspirin 81 MG tablet Take 81 mg by mouth daily.    . Calcium Carbonate-Vitamin D (CALCIUM 600+D) 600-400 MG-UNIT per tablet Take 2 tablets by mouth daily.    . clindamycin (CLEOCIN) 300 MG capsule Take 600 mg by mouth once prior to dental procedure 10 capsule 0  . fluticasone (FLONASE) 50 MCG/ACT nasal spray Place 2 sprays into both nostrils daily. 16 g 5  . glucosamine-chondroitin 500-400 MG tablet Take 1 tablet by mouth daily.     . Multiple Vitamin (MULTIVITAMIN) tablet Take 1 tablet by mouth daily.    Marland Kitchen omega-3 fish oil (MAXEPA) 1000 MG CAPS capsule Take 2 capsules by mouth daily. TAKING 1200mg  DAILY     No current facility-administered medications on file prior to visit.     Review of Systems:  As per HPI- otherwise negative. No CP or SOB noted at all No history of DM  Physical Examination: Vitals:   12/18/17 1530  BP: 110/68  Pulse: 86  Resp: 16  Temp: 97.7 F (36.5 C)  SpO2: 98%   Vitals:   12/18/17 1530  Weight: 152 lb (68.9 kg)  Height: 5\' 2"  (1.575 m)   Body mass index is 27.8 kg/m. Ideal Body Weight: Weight in (lb) to have BMI = 25: 136.4  GEN: WDWN, NAD, Non-toxic, A & O x 3, looks well, significant scolisis HEENT: Atraumatic, Normocephalic. Neck supple. No masses, No LAD. Ears and Nose: No external deformity. CV: RRR, No M/G/R. No JVD. No thrill. No extra heart sounds. PULM: CTA B, no wheezes, crackles, rhonchi. No retractions. No resp. distress. No accessory muscle use. ABD: S, NT, ND, +BS. No rebound. No HSM. EXTR: No c/c/e NEURO Normal gait.  PSYCH: Normally interactive. Conversant. Not depressed or anxious appearing.  Calm demeanor.  significant scoliosis makes it difficult to examine her abdomen She does have a prominent bulge in the left side and I cannot rule out a mass there Her left flank is slightly  tender form recent contusion but otherwise normal to exam Left shoulder - full ROM and strength but mild tenderness over RCT insertion on the proximal humerus   Results for orders placed or performed in visit on 12/18/17  POCT glucose (manual entry)  Result Value Ref Range   POC Glucose  99 70 - 99 mg/dl    Assessment and Plan: COPD with chronic bronchitis ([place]) - Plan: Fluticasone-Salmeterol (ADVAIR) 250-50 MCG/DOSE AEPB  Primary osteoarthritis, unspecified site - Plan: meloxicam (MOBIC) 15 MG tablet  Dry mouth - Plan: CBC, Comprehensive metabolic panel, TSH  Medication monitoring encounter - Plan: CBC, Comprehensive metabolic panel  Thirst - Plan: Comprehensive metabolic panel, TSH, POCT glucose (manual entry)  Screening for diabetes mellitus  Weight loss - Plan: TSH  Hyperglycemia - Plan: POCT glucose (manual entry), Hemoglobin A1c  Intra-abdominal and pelvic swelling, mass and lump, unspecified site  Rotator cuff tendonitis, left  Here today with a few concerns Refilled medications as above For the time being she prefers to use a home exercise program for her shoulder- gave hand out.  Will let me know if not sufficient Concern of weight loss, dry mouth and increased thirst- labs pending as above She also feels that the contour of her abdomen may have changed.  This is really worrisome to her,she is afraid a mass may be present. Cannot rule this out on exam and she does have left sided fullness.  Will set up a CT for her  Signed Lamar Blinks, MD Received her labs 10/17, message to pt  Blood counts look fine Metabolic profile is ok - minimally low sodium is not of concern Thyroid normal A1c does NOT suggest diabetes  Please let me know if you don't get an appt for your CT scan soon-  Results for orders placed or performed in visit on 12/18/17  CBC  Result Value Ref Range   WBC 6.0 4.0 - 10.5 K/uL   RBC 4.61 3.87 - 5.11 Mil/uL   Platelets 280.0 150.0 - 400.0 K/uL    Hemoglobin 14.1 12.0 - 15.0 g/dL   HCT 41.2 36.0 - 46.0 %   MCV 89.4 78.0 - 100.0 fl   MCHC 34.3 30.0 - 36.0 g/dL   RDW 13.7 11.5 - 15.5 %  Comprehensive metabolic panel  Result Value Ref Range   Sodium 133 (L) 135 - 145 mEq/L   Potassium 4.5 3.5 - 5.1 mEq/L   Chloride 99 96 - 112 mEq/L   CO2 26 19 - 32 mEq/L   Glucose, Bld 102 (H) 70 - 99 mg/dL   BUN 22 6 - 23 mg/dL   Creatinine, Ser 0.53 0.40 - 1.20 mg/dL   Total Bilirubin 0.3 0.2 - 1.2 mg/dL   Alkaline Phosphatase 82 39 - 117 U/L   AST 21 0 - 37 U/L   ALT 15 0 - 35 U/L   Total Protein 7.1 6.0 - 8.3 g/dL   Albumin 3.8 3.5 - 5.2 g/dL   Calcium 9.7 8.4 - 10.5 mg/dL   GFR 117.03 >60.00 mL/min  TSH  Result Value Ref Range   TSH 1.42 0.35 - 4.50 uIU/mL  Hemoglobin A1c  Result Value Ref Range   Hgb A1c MFr Bld 5.4 4.6 - 6.5 %  POCT glucose (manual entry)  Result Value Ref Range   POC Glucose 99 70 - 99 mg/dl   Received her CT results and gave her a call on 10/22 after discussion with radiology.  Uterine mass is likely a leiomyoma but we will obtain an [place] to get more info Will also set up a CT of her chest for 6 months from now  The bulge she has noted in her left side seems to be the abd contents bulging out from weakened abd muscular wall.   Ct Abdomen Pelvis W Contrast  Result Date: 12/21/2017 CLINICAL DATA:  Recent back injury. The patient complains of left flank pain and swelling. EXAM: CT ABDOMEN AND PELVIS WITH CONTRAST TECHNIQUE: Multidetector CT imaging of the abdomen and pelvis was performed using the standard protocol following bolus administration of intravenous contrast. CONTRAST:  160mL ISOVUE-300 IOPAMIDOL (ISOVUE-300) INJECTION 61% COMPARISON:  None. FINDINGS: Lower chest: Small area of ground-glass attenuation in the left lower lobe measures approximately 2.1 cm, image 5/34, sequence 4 and image 71/101, sequence 5. Mild calcific atherosclerotic disease of the coronary arteries. Six mm soft tissue nodule in the  left breast slightly upper quadrant, anterior depth. Moderate to large hiatal hernia. Hepatobiliary: No focal liver abnormality is seen. No gallstones, gallbladder wall thickening, or biliary dilatation. Pancreas: Unremarkable. No pancreatic ductal dilatation or surrounding inflammatory changes. Spleen: Normal in size without focal abnormality. Adrenals/Urinary Tract: Adrenal glands are unremarkable. Kidneys are normal, without renal calculi, focal lesion, or hydronephrosis. Bladder is unremarkable. Stomach/Bowel: Stomach is within normal limits. Post appendectomy. No evidence of bowel wall thickening, distention, or inflammatory changes. Diffusely scattered left colonic diverticulosis. Vascular/Lymphatic: Aortic atherosclerosis. No enlarged abdominal or pelvic lymph nodes. Reproductive: Bulbous retroverted uterus. Myometrial mass within the uterine fundus cannot be excluded. Other: No free fluid in the abdomen or pelvis. Diastasis of the abdominal wall with protrusion of abdominal contents in bilateral flanks, more prominent on the left Musculoskeletal: Osteopenia. Levoconvex scoliosis of the spine with multilevel osteoarthritic changes. IMPRESSION: Marked diastasis of the abdominal wall with protrusion of abdominal contents into the bilateral flanks, more prominent on the left, which may account for the swelling described by the patient. Diffuse left colonic diverticulosis without evidence of acute diverticulitis. Bulbous retroverted uterus with probable myometrial mass within the fundus. Small area of ground-glass attenuation in the left lower lobe, which measures 2.1 cm, indeterminate. Initial follow-up with CT at 6-12 months is recommended to confirm persistence. If persistent, repeat CT is recommended every 2 years until 5 years of stability has been established. This recommendation follows the consensus statement: Guidelines for Management of Incidental Pulmonary Nodules Detected on CT Images: From the  Fleischner Society 2017; Radiology 2017; 284:228-243. Osteopenia, levoconvex scoliosis and multilevel osteoarthritic changes of the spine. Electronically Signed   By: Fidela Salisbury M.D.   On: 12/21/2017 15:36

## 2017-12-19 ENCOUNTER — Encounter: Payer: Self-pay | Admitting: Family Medicine

## 2017-12-19 LAB — COMPREHENSIVE METABOLIC PANEL
ALBUMIN: 3.8 g/dL (ref 3.5–5.2)
ALT: 15 U/L (ref 0–35)
AST: 21 U/L (ref 0–37)
Alkaline Phosphatase: 82 U/L (ref 39–117)
BILIRUBIN TOTAL: 0.3 mg/dL (ref 0.2–1.2)
BUN: 22 mg/dL (ref 6–23)
CHLORIDE: 99 meq/L (ref 96–112)
CO2: 26 mEq/L (ref 19–32)
CREATININE: 0.53 mg/dL (ref 0.40–1.20)
Calcium: 9.7 mg/dL (ref 8.4–10.5)
GFR: 117.03 mL/min (ref >60.00)
Glucose, Bld: 102 mg/dL — ABNORMAL HIGH (ref 70–99)
Potassium: 4.5 mEq/L (ref 3.5–5.1)
Sodium: 133 mEq/L — ABNORMAL LOW (ref 135–145)
TOTAL PROTEIN: 7.1 g/dL (ref 6.0–8.3)

## 2017-12-19 LAB — CBC
HCT: 41.2 % (ref 36.0–46.0)
Hemoglobin: 14.1 g/dL (ref 12.0–15.0)
MCHC: 34.3 g/dL (ref 30.0–36.0)
MCV: 89.4 fl (ref 78.0–100.0)
Platelets: 280 10*3/uL (ref 150.0–400.0)
RBC: 4.61 Mil/uL (ref 3.87–5.11)
RDW: 13.7 % (ref 11.5–15.5)
WBC: 6 10*3/uL (ref 4.0–10.5)

## 2017-12-19 LAB — HEMOGLOBIN A1C: HEMOGLOBIN A1C: 5.4 % (ref 4.6–6.5)

## 2017-12-19 LAB — TSH: TSH: 1.42 u[IU]/mL (ref 0.35–4.50)

## 2017-12-21 ENCOUNTER — Encounter (HOSPITAL_BASED_OUTPATIENT_CLINIC_OR_DEPARTMENT_OTHER): Payer: Self-pay

## 2017-12-21 ENCOUNTER — Ambulatory Visit (HOSPITAL_BASED_OUTPATIENT_CLINIC_OR_DEPARTMENT_OTHER)
Admission: RE | Admit: 2017-12-21 | Discharge: 2017-12-21 | Disposition: A | Discharge status: Home / Self Care | Payer: Medicare Other | Source: Ambulatory Visit | Attending: Family Medicine | Admitting: Family Medicine

## 2017-12-21 DIAGNOSIS — R1900 Intra-abdominal and pelvic swelling, mass and lump, unspecified site: Secondary | ICD-10-CM | POA: Diagnosis not present

## 2017-12-21 DIAGNOSIS — M8588 Other specified disorders of bone density and structure, other site: Secondary | ICD-10-CM | POA: Insufficient documentation

## 2017-12-21 DIAGNOSIS — R109 Unspecified abdominal pain: Secondary | ICD-10-CM | POA: Diagnosis not present

## 2017-12-21 DIAGNOSIS — S3993XA Unspecified injury of pelvis, initial encounter: Secondary | ICD-10-CM | POA: Diagnosis not present

## 2017-12-21 DIAGNOSIS — N854 Malposition of uterus: Secondary | ICD-10-CM | POA: Insufficient documentation

## 2017-12-21 DIAGNOSIS — N859 Noninflammatory disorder of uterus, unspecified: Secondary | ICD-10-CM | POA: Insufficient documentation

## 2017-12-21 DIAGNOSIS — K5730 Diverticulosis of large intestine without perforation or abscess without bleeding: Secondary | ICD-10-CM | POA: Insufficient documentation

## 2017-12-21 MED ORDER — IOPAMIDOL (ISOVUE-300) INJECTION 61%
100.0000 mL | Freq: Once | INTRAVENOUS | Status: AC | PRN
  Administered 2017-12-21: 100 mL via INTRAVENOUS

## 2017-12-23 ENCOUNTER — Ambulatory Visit (HOSPITAL_BASED_OUTPATIENT_CLINIC_OR_DEPARTMENT_OTHER): Payer: Medicare Other

## 2017-12-24 ENCOUNTER — Encounter: Payer: Self-pay | Admitting: Family Medicine

## 2017-12-24 NOTE — Addendum Note (Signed)
Addended by: Lamar Blinks C on: 12/24/2017 01:28 PM   Modules accepted: Orders

## 2017-12-26 ENCOUNTER — Other Ambulatory Visit: Payer: Self-pay | Admitting: Family Medicine

## 2017-12-26 DIAGNOSIS — N858 Other specified noninflammatory disorders of uterus: Secondary | ICD-10-CM

## 2017-12-27 ENCOUNTER — Ambulatory Visit (HOSPITAL_BASED_OUTPATIENT_CLINIC_OR_DEPARTMENT_OTHER): Payer: Medicare Other

## 2017-12-28 ENCOUNTER — Ambulatory Visit (HOSPITAL_BASED_OUTPATIENT_CLINIC_OR_DEPARTMENT_OTHER)
Admission: RE | Admit: 2017-12-28 | Discharge: 2017-12-28 | Disposition: A | Discharge status: Home / Self Care | Payer: Medicare Other | Source: Ambulatory Visit | Attending: Family Medicine | Admitting: Family Medicine

## 2017-12-28 DIAGNOSIS — N858 Other specified noninflammatory disorders of uterus: Secondary | ICD-10-CM | POA: Insufficient documentation

## 2017-12-28 DIAGNOSIS — D259 Leiomyoma of uterus, unspecified: Secondary | ICD-10-CM | POA: Diagnosis not present

## 2017-12-30 ENCOUNTER — Telehealth: Payer: Self-pay | Admitting: Family Medicine

## 2017-12-30 NOTE — Telephone Encounter (Signed)
Called pt to go over her uterine US  IMPRESSION: Findings compatible with bilateral fundal fibroids with the largest lying on the right measuring 3.1 cm in greatest dimension. The endometrial stripe is not abnormally thickened.  Nonvisualization of the ovaries.  Fibroids, nothing further needed at this time.  She is very relieved

## 2018-01-03 DIAGNOSIS — M7582 Other shoulder lesions, left shoulder: Secondary | ICD-10-CM

## 2018-01-03 HISTORY — DX: Other shoulder lesions, left shoulder: M75.82

## 2018-01-07 DIAGNOSIS — M19012 Primary osteoarthritis, left shoulder: Secondary | ICD-10-CM | POA: Diagnosis not present

## 2018-01-07 DIAGNOSIS — M779 Enthesopathy, unspecified: Secondary | ICD-10-CM | POA: Diagnosis not present

## 2018-01-22 ENCOUNTER — Other Ambulatory Visit: Payer: Self-pay

## 2018-01-23 DIAGNOSIS — M25562 Pain in left knee: Secondary | ICD-10-CM | POA: Diagnosis not present

## 2018-05-04 ENCOUNTER — Emergency Department (HOSPITAL_COMMUNITY): Payer: Medicare Other | Admitting: Registered Nurse

## 2018-05-04 ENCOUNTER — Encounter (HOSPITAL_BASED_OUTPATIENT_CLINIC_OR_DEPARTMENT_OTHER): Payer: Self-pay | Admitting: Emergency Medicine

## 2018-05-04 ENCOUNTER — Encounter (HOSPITAL_COMMUNITY): Admission: EM | Disposition: A | Discharge status: Home / Self Care | Payer: Self-pay | Source: Home / Self Care

## 2018-05-04 ENCOUNTER — Other Ambulatory Visit: Payer: Self-pay

## 2018-05-04 ENCOUNTER — Inpatient Hospital Stay (HOSPITAL_BASED_OUTPATIENT_CLINIC_OR_DEPARTMENT_OTHER)
Admission: EM | Admit: 2018-05-04 | Discharge: 2018-05-08 | DRG: 329 | Disposition: A | Discharge status: Home / Self Care | Payer: Medicare Other | Attending: General Surgery | Admitting: General Surgery

## 2018-05-04 ENCOUNTER — Emergency Department (HOSPITAL_BASED_OUTPATIENT_CLINIC_OR_DEPARTMENT_OTHER): Payer: Medicare Other

## 2018-05-04 DIAGNOSIS — M8580 Other specified disorders of bone density and structure, unspecified site: Secondary | ICD-10-CM | POA: Diagnosis present

## 2018-05-04 DIAGNOSIS — Z800 Family history of malignant neoplasm of digestive organs: Secondary | ICD-10-CM

## 2018-05-04 DIAGNOSIS — D259 Leiomyoma of uterus, unspecified: Secondary | ICD-10-CM | POA: Diagnosis not present

## 2018-05-04 DIAGNOSIS — Z79899 Other long term (current) drug therapy: Secondary | ICD-10-CM

## 2018-05-04 DIAGNOSIS — Z8249 Family history of ischemic heart disease and other diseases of the circulatory system: Secondary | ICD-10-CM

## 2018-05-04 DIAGNOSIS — E785 Hyperlipidemia, unspecified: Secondary | ICD-10-CM | POA: Diagnosis not present

## 2018-05-04 DIAGNOSIS — K4130 Unilateral femoral hernia, with obstruction, without gangrene, not specified as recurrent: Principal | ICD-10-CM | POA: Diagnosis present

## 2018-05-04 DIAGNOSIS — Y92009 Unspecified place in unspecified non-institutional (private) residence as the place of occurrence of the external cause: Secondary | ICD-10-CM

## 2018-05-04 DIAGNOSIS — M4800 Spinal stenosis, site unspecified: Secondary | ICD-10-CM | POA: Diagnosis not present

## 2018-05-04 DIAGNOSIS — Z7982 Long term (current) use of aspirin: Secondary | ICD-10-CM

## 2018-05-04 DIAGNOSIS — Z7951 Long term (current) use of inhaled steroids: Secondary | ICD-10-CM

## 2018-05-04 DIAGNOSIS — R0689 Other abnormalities of breathing: Secondary | ICD-10-CM | POA: Diagnosis not present

## 2018-05-04 DIAGNOSIS — M419 Scoliosis, unspecified: Secondary | ICD-10-CM | POA: Diagnosis not present

## 2018-05-04 DIAGNOSIS — K5730 Diverticulosis of large intestine without perforation or abscess without bleeding: Secondary | ICD-10-CM | POA: Insufficient documentation

## 2018-05-04 DIAGNOSIS — Z8349 Family history of other endocrine, nutritional and metabolic diseases: Secondary | ICD-10-CM

## 2018-05-04 DIAGNOSIS — Z791 Long term (current) use of non-steroidal anti-inflammatories (NSAID): Secondary | ICD-10-CM

## 2018-05-04 DIAGNOSIS — R739 Hyperglycemia, unspecified: Secondary | ICD-10-CM | POA: Diagnosis not present

## 2018-05-04 DIAGNOSIS — K56609 Unspecified intestinal obstruction, unspecified as to partial versus complete obstruction: Secondary | ICD-10-CM

## 2018-05-04 DIAGNOSIS — G629 Polyneuropathy, unspecified: Secondary | ICD-10-CM | POA: Diagnosis not present

## 2018-05-04 DIAGNOSIS — Z9181 History of falling: Secondary | ICD-10-CM

## 2018-05-04 DIAGNOSIS — Z96652 Presence of left artificial knee joint: Secondary | ICD-10-CM | POA: Diagnosis present

## 2018-05-04 DIAGNOSIS — R2681 Unsteadiness on feet: Secondary | ICD-10-CM | POA: Diagnosis not present

## 2018-05-04 DIAGNOSIS — K5669 Other partial intestinal obstruction: Secondary | ICD-10-CM | POA: Diagnosis not present

## 2018-05-04 DIAGNOSIS — H9190 Unspecified hearing loss, unspecified ear: Secondary | ICD-10-CM | POA: Diagnosis present

## 2018-05-04 DIAGNOSIS — J449 Chronic obstructive pulmonary disease, unspecified: Secondary | ICD-10-CM | POA: Diagnosis not present

## 2018-05-04 DIAGNOSIS — Z825 Family history of asthma and other chronic lower respiratory diseases: Secondary | ICD-10-CM

## 2018-05-04 DIAGNOSIS — Z886 Allergy status to analgesic agent status: Secondary | ICD-10-CM

## 2018-05-04 DIAGNOSIS — Z880 Allergy status to penicillin: Secondary | ICD-10-CM

## 2018-05-04 DIAGNOSIS — Z9049 Acquired absence of other specified parts of digestive tract: Secondary | ICD-10-CM

## 2018-05-04 DIAGNOSIS — Z833 Family history of diabetes mellitus: Secondary | ICD-10-CM

## 2018-05-04 DIAGNOSIS — K55029 Acute infarction of small intestine, extent unspecified: Secondary | ICD-10-CM | POA: Diagnosis not present

## 2018-05-04 DIAGNOSIS — R198 Other specified symptoms and signs involving the digestive system and abdomen: Secondary | ICD-10-CM

## 2018-05-04 DIAGNOSIS — R1032 Left lower quadrant pain: Secondary | ICD-10-CM | POA: Diagnosis not present

## 2018-05-04 DIAGNOSIS — K4090 Unilateral inguinal hernia, without obstruction or gangrene, not specified as recurrent: Secondary | ICD-10-CM | POA: Diagnosis not present

## 2018-05-04 DIAGNOSIS — W19XXXA Unspecified fall, initial encounter: Secondary | ICD-10-CM

## 2018-05-04 HISTORY — DX: Unspecified place in unspecified non-institutional (private) residence as the place of occurrence of the external cause: W19.XXXA

## 2018-05-04 HISTORY — DX: Contusion of lower back and pelvis, initial encounter: S30.0XXA

## 2018-05-04 HISTORY — DX: Unspecified place in unspecified non-institutional (private) residence as the place of occurrence of the external cause: Y92.009

## 2018-05-04 HISTORY — DX: Unspecified fall, initial encounter: W19.XXXA

## 2018-05-04 HISTORY — DX: Other shoulder lesions, left shoulder: M75.82

## 2018-05-04 HISTORY — DX: Fracture of unspecified carpal bone, left wrist, initial encounter for closed fracture: S62.102A

## 2018-05-04 HISTORY — PX: LAPAROSCOPY: SHX197

## 2018-05-04 HISTORY — DX: Unspecified fracture of shaft of unspecified ulna, initial encounter for closed fracture: S52.209A

## 2018-05-04 LAB — COMPREHENSIVE METABOLIC PANEL
ALK PHOS: 81 U/L (ref 38–126)
ALT: 16 U/L (ref 0–44)
AST: 28 U/L (ref 15–41)
Albumin: 4 g/dL (ref 3.5–5.0)
Anion gap: 9 (ref 5–15)
BUN: 28 mg/dL — ABNORMAL HIGH (ref 8–23)
CO2: 23 mmol/L (ref 22–32)
Calcium: 9.4 mg/dL (ref 8.9–10.3)
Chloride: 104 mmol/L (ref 98–111)
Creatinine, Ser: 0.49 mg/dL (ref 0.44–1.00)
GFR calc Af Amer: >60 mL/min (ref >60)
Glucose, Bld: 133 mg/dL — ABNORMAL HIGH (ref 70–99)
Potassium: 4.3 mmol/L (ref 3.5–5.1)
Sodium: 136 mmol/L (ref 135–145)
Total Bilirubin: 0.8 mg/dL (ref 0.3–1.2)
Total Protein: 7.3 g/dL (ref 6.5–8.1)

## 2018-05-04 LAB — CBC WITH DIFFERENTIAL/PLATELET
Abs Immature Granulocytes: 0.03 10*3/uL (ref 0.00–0.07)
Basophils Absolute: 0.1 10*3/uL (ref 0.0–0.1)
Basophils Relative: 1 %
Eosinophils Absolute: 0 10*3/uL (ref 0.0–0.5)
Eosinophils Relative: 0 %
HCT: 42.7 % (ref 36.0–46.0)
Hemoglobin: 13.8 g/dL (ref 12.0–15.0)
Immature Granulocytes: 0 %
Lymphocytes Relative: 6 %
Lymphs Abs: 0.6 10*3/uL — ABNORMAL LOW (ref 0.7–4.0)
MCH: 29.6 pg (ref 26.0–34.0)
MCHC: 32.3 g/dL (ref 30.0–36.0)
MCV: 91.6 fL (ref 80.0–100.0)
MONOS PCT: 2 %
Monocytes Absolute: 0.2 10*3/uL (ref 0.1–1.0)
Neutro Abs: 9.1 10*3/uL — ABNORMAL HIGH (ref 1.7–7.7)
Neutrophils Relative %: 91 %
Platelets: 276 10*3/uL (ref 150–400)
RBC: 4.66 MIL/uL (ref 3.87–5.11)
RDW: 13.2 % (ref 11.5–15.5)
WBC: 10 10*3/uL (ref 4.0–10.5)
nRBC: 0 % (ref 0.0–0.2)

## 2018-05-04 LAB — LACTIC ACID, PLASMA: Lactic Acid, Venous: 0.9 mmol/L (ref 0.5–1.9)

## 2018-05-04 SURGERY — LAPAROSCOPY, DIAGNOSTIC
Anesthesia: General

## 2018-05-04 MED ORDER — LACTATED RINGERS IV SOLN
INTRAVENOUS | Status: DC | PRN
  Administered 2018-05-04 (×3): via INTRAVENOUS

## 2018-05-04 MED ORDER — MORPHINE SULFATE (PF) 4 MG/ML IV SOLN
4.0000 mg | Freq: Once | INTRAVENOUS | Status: AC
  Administered 2018-05-04: 4 mg via INTRAVENOUS
  Filled 2018-05-04: qty 1

## 2018-05-04 MED ORDER — ONDANSETRON HCL 4 MG/2ML IJ SOLN: 4.0000 mg | Freq: Once | INTRAMUSCULAR | Status: DC | PRN

## 2018-05-04 MED ORDER — ROCURONIUM BROMIDE 100 MG/10ML IV SOLN
INTRAVENOUS | Status: AC
  Filled 2018-05-04: qty 1

## 2018-05-04 MED ORDER — ONDANSETRON HCL 4 MG/2ML IJ SOLN
4.0000 mg | Freq: Four times a day (QID) | INTRAMUSCULAR | Status: DC | PRN
  Administered 2018-05-06: 4 mg via INTRAVENOUS
  Filled 2018-05-04: qty 2

## 2018-05-04 MED ORDER — DEXAMETHASONE SODIUM PHOSPHATE 10 MG/ML IJ SOLN
INTRAMUSCULAR | Status: DC | PRN
  Administered 2018-05-04: 6 mg via INTRAVENOUS

## 2018-05-04 MED ORDER — METHOCARBAMOL 1000 MG/10ML IJ SOLN
1000.0000 mg | Freq: Four times a day (QID) | INTRAVENOUS | Status: DC | PRN
  Filled 2018-05-04: qty 10

## 2018-05-04 MED ORDER — SUGAMMADEX SODIUM 200 MG/2ML IV SOLN
INTRAVENOUS | Status: DC | PRN
  Administered 2018-05-04: 200 mg via INTRAVENOUS

## 2018-05-04 MED ORDER — MENTHOL 3 MG MT LOZG
1.0000 | LOZENGE | OROMUCOSAL | Status: DC | PRN
  Filled 2018-05-04: qty 9

## 2018-05-04 MED ORDER — SODIUM CHLORIDE 0.9 % IR SOLN
Status: DC | PRN
  Administered 2018-05-04: 1000 mL

## 2018-05-04 MED ORDER — SODIUM CHLORIDE 0.9 % IV SOLN
INTRAVENOUS | Status: DC
  Filled 2018-05-04: qty 6

## 2018-05-04 MED ORDER — FENTANYL CITRATE (PF) 250 MCG/5ML IJ SOLN
INTRAMUSCULAR | Status: AC
  Filled 2018-05-04: qty 5

## 2018-05-04 MED ORDER — DIPHENHYDRAMINE HCL 50 MG/ML IJ SOLN: 12.5000 mg | Freq: Four times a day (QID) | INTRAMUSCULAR | Status: DC | PRN

## 2018-05-04 MED ORDER — ACETAMINOPHEN 500 MG PO TABS
1000.0000 mg | ORAL_TABLET | ORAL | Status: AC
  Administered 2018-05-05: 1000 mg via ORAL
  Filled 2018-05-04: qty 2

## 2018-05-04 MED ORDER — CHLORHEXIDINE GLUCONATE CLOTH 2 % EX PADS: 6.0000 | MEDICATED_PAD | Freq: Once | CUTANEOUS | Status: DC

## 2018-05-04 MED ORDER — ONDANSETRON HCL 4 MG/2ML IJ SOLN
INTRAMUSCULAR | Status: AC
  Filled 2018-05-04: qty 2

## 2018-05-04 MED ORDER — ROCURONIUM BROMIDE 10 MG/ML (PF) SYRINGE
PREFILLED_SYRINGE | INTRAVENOUS | Status: DC | PRN
  Administered 2018-05-04: 40 mg via INTRAVENOUS

## 2018-05-04 MED ORDER — GENTAMICIN SULFATE 40 MG/ML IJ SOLN
5.0000 mg/kg | INTRAVENOUS | Status: AC
  Administered 2018-05-04: 340 mg via INTRAVENOUS
  Filled 2018-05-04: qty 8.5

## 2018-05-04 MED ORDER — GUAIFENESIN-DM 100-10 MG/5ML PO SYRP
10.0000 mL | ORAL_SOLUTION | ORAL | Status: DC | PRN
  Administered 2018-05-06 – 2018-05-07 (×3): 10 mL via ORAL
  Filled 2018-05-04 (×3): qty 10

## 2018-05-04 MED ORDER — FENTANYL CITRATE (PF) 100 MCG/2ML IJ SOLN
INTRAMUSCULAR | Status: DC | PRN
  Administered 2018-05-04 (×3): 50 ug via INTRAVENOUS
  Administered 2018-05-04: 100 ug via INTRAVENOUS
  Administered 2018-05-04 – 2018-05-05 (×5): 50 ug via INTRAVENOUS

## 2018-05-04 MED ORDER — BUPIVACAINE HCL 0.25 % IJ SOLN
INTRAMUSCULAR | Status: DC | PRN
  Administered 2018-05-04: 10 mL
  Administered 2018-05-04: 30 mL

## 2018-05-04 MED ORDER — LACTATED RINGERS IR SOLN
Status: DC | PRN
  Administered 2018-05-04: 1000 mL

## 2018-05-04 MED ORDER — LIDOCAINE 2% (20 MG/ML) 5 ML SYRINGE
INTRAMUSCULAR | Status: DC | PRN
  Administered 2018-05-04: 1 mg/kg/h via INTRAVENOUS

## 2018-05-04 MED ORDER — PROCHLORPERAZINE EDISYLATE 10 MG/2ML IJ SOLN: 5.0000 mg | INTRAMUSCULAR | Status: DC | PRN

## 2018-05-04 MED ORDER — IOHEXOL 300 MG/ML  SOLN
100.0000 mL | Freq: Once | INTRAMUSCULAR | Status: AC | PRN
  Administered 2018-05-04: 100 mL via INTRAVENOUS

## 2018-05-04 MED ORDER — PHENYLEPHRINE HCL 10 MG/ML IJ SOLN
INTRAMUSCULAR | Status: AC
  Filled 2018-05-04: qty 1

## 2018-05-04 MED ORDER — HYDROCORTISONE 1 % EX CREA
1.0000 "application " | TOPICAL_CREAM | Freq: Three times a day (TID) | CUTANEOUS | Status: DC | PRN
  Filled 2018-05-04: qty 28

## 2018-05-04 MED ORDER — MAGIC MOUTHWASH
15.0000 mL | Freq: Four times a day (QID) | ORAL | Status: DC | PRN
  Filled 2018-05-04: qty 15

## 2018-05-04 MED ORDER — BUPIVACAINE HCL (PF) 0.25 % IJ SOLN
INTRAMUSCULAR | Status: AC
  Filled 2018-05-04: qty 60

## 2018-05-04 MED ORDER — ALUM & MAG HYDROXIDE-SIMETH 200-200-20 MG/5ML PO SUSP: 30.0000 mL | Freq: Four times a day (QID) | ORAL | Status: DC | PRN

## 2018-05-04 MED ORDER — SUCCINYLCHOLINE CHLORIDE 200 MG/10ML IV SOSY
PREFILLED_SYRINGE | INTRAVENOUS | Status: AC
  Filled 2018-05-04: qty 10

## 2018-05-04 MED ORDER — GABAPENTIN 300 MG PO CAPS
300.0000 mg | ORAL_CAPSULE | ORAL | Status: AC
  Filled 2018-05-04: qty 1

## 2018-05-04 MED ORDER — SUCCINYLCHOLINE CHLORIDE 200 MG/10ML IV SOSY
PREFILLED_SYRINGE | INTRAVENOUS | Status: DC | PRN
  Administered 2018-05-04: 140 mg via INTRAVENOUS

## 2018-05-04 MED ORDER — LIDOCAINE 2% (20 MG/ML) 5 ML SYRINGE
INTRAMUSCULAR | Status: DC | PRN
  Administered 2018-05-04: 100 mg via INTRAVENOUS

## 2018-05-04 MED ORDER — PROPOFOL 10 MG/ML IV BOLUS
INTRAVENOUS | Status: DC | PRN
  Administered 2018-05-04: 100 mg via INTRAVENOUS

## 2018-05-04 MED ORDER — MEPERIDINE HCL 50 MG/ML IJ SOLN: 6.2500 mg | INTRAMUSCULAR | Status: DC | PRN

## 2018-05-04 MED ORDER — SODIUM CHLORIDE 0.9 % IV SOLN
8.0000 mg | Freq: Four times a day (QID) | INTRAVENOUS | Status: DC | PRN
  Filled 2018-05-04: qty 4

## 2018-05-04 MED ORDER — LACTATED RINGERS IV BOLUS: 1000.0000 mL | Freq: Three times a day (TID) | INTRAVENOUS | Status: DC | PRN

## 2018-05-04 MED ORDER — LIP MEDEX EX OINT
1.0000 "application " | TOPICAL_OINTMENT | Freq: Two times a day (BID) | CUTANEOUS | Status: DC
  Administered 2018-05-05 – 2018-05-08 (×6): 1 via TOPICAL
  Filled 2018-05-04 (×2): qty 7

## 2018-05-04 MED ORDER — CLINDAMYCIN PHOSPHATE 900 MG/50ML IV SOLN
INTRAVENOUS | Status: AC
  Filled 2018-05-04: qty 50

## 2018-05-04 MED ORDER — HYDROMORPHONE HCL 1 MG/ML IJ SOLN: 0.2500 mg | INTRAMUSCULAR | Status: DC | PRN

## 2018-05-04 MED ORDER — DEXAMETHASONE SODIUM PHOSPHATE 10 MG/ML IJ SOLN
INTRAMUSCULAR | Status: AC
  Filled 2018-05-04: qty 1

## 2018-05-04 MED ORDER — BISACODYL 10 MG RE SUPP: 10.0000 mg | Freq: Two times a day (BID) | RECTAL | Status: DC | PRN

## 2018-05-04 MED ORDER — ONDANSETRON HCL 4 MG/2ML IJ SOLN
INTRAMUSCULAR | Status: DC | PRN
  Administered 2018-05-04: 4 mg via INTRAVENOUS

## 2018-05-04 MED ORDER — SODIUM CHLORIDE 0.9 % IV BOLUS
500.0000 mL | Freq: Once | INTRAVENOUS | Status: AC
  Administered 2018-05-04: 500 mL via INTRAVENOUS

## 2018-05-04 MED ORDER — BUPIVACAINE LIPOSOME 1.3 % IJ SUSP
20.0000 mL | Freq: Once | INTRAMUSCULAR | Status: DC
  Filled 2018-05-04: qty 20

## 2018-05-04 MED ORDER — PHENYLEPHRINE 40 MCG/ML (10ML) SYRINGE FOR IV PUSH (FOR BLOOD PRESSURE SUPPORT)
PREFILLED_SYRINGE | INTRAVENOUS | Status: AC
  Filled 2018-05-04: qty 10

## 2018-05-04 MED ORDER — PHENOL 1.4 % MT LIQD
1.0000 | OROMUCOSAL | Status: DC | PRN
  Filled 2018-05-04: qty 177

## 2018-05-04 MED ORDER — HYDROCORTISONE 2.5 % RE CREA
1.0000 "application " | TOPICAL_CREAM | Freq: Four times a day (QID) | RECTAL | Status: DC | PRN
  Filled 2018-05-04: qty 28.35

## 2018-05-04 MED ORDER — BUPIVACAINE LIPOSOME 1.3 % IJ SUSP
INTRAMUSCULAR | Status: DC | PRN
  Administered 2018-05-04: 20 mL

## 2018-05-04 MED ORDER — FENTANYL CITRATE (PF) 100 MCG/2ML IJ SOLN: 25.0000 ug | INTRAMUSCULAR | Status: DC | PRN

## 2018-05-04 MED ORDER — PHENYLEPHRINE HCL 10 MG/ML IJ SOLN
INTRAMUSCULAR | Status: DC | PRN
  Administered 2018-05-04: 80 ug via INTRAVENOUS
  Administered 2018-05-04 (×2): 120 ug via INTRAVENOUS

## 2018-05-04 MED ORDER — CLINDAMYCIN PHOSPHATE 900 MG/50ML IV SOLN
900.0000 mg | INTRAVENOUS | Status: DC
  Filled 2018-05-04: qty 50

## 2018-05-04 MED ORDER — PROPOFOL 10 MG/ML IV BOLUS
INTRAVENOUS | Status: AC
  Filled 2018-05-04: qty 20

## 2018-05-04 SURGICAL SUPPLY — 78 items
APPLIER CLIP 5 13 M/L LIGAMAX5 (MISCELLANEOUS)
APPLIER CLIP ROT 10 11.4 M/L (STAPLE)
BLADE HEX COATED 2.75 (ELECTRODE) ×2 IMPLANT
BLADE SURG SZ10 CARB STEEL (BLADE) ×2 IMPLANT
CABLE HIGH FREQUENCY MONO STRZ (ELECTRODE) ×2 IMPLANT
CELLS DAT CNTRL 66122 CELL SVR (MISCELLANEOUS) IMPLANT
CLIP APPLIE 5 13 M/L LIGAMAX5 (MISCELLANEOUS) IMPLANT
CLIP APPLIE ROT 10 11.4 M/L (STAPLE) IMPLANT
COVER MAYO STAND STRL (DRAPES) ×2 IMPLANT
COVER SURGICAL LIGHT HANDLE (MISCELLANEOUS) ×2 IMPLANT
COVER WAND RF STERILE (DRAPES) ×2 IMPLANT
DECANTER SPIKE VIAL GLASS SM (MISCELLANEOUS) ×2 IMPLANT
DEVICE SECURE STRAP 25 ABSORB (INSTRUMENTS) ×2 IMPLANT
DRAIN CHANNEL 19F RND (DRAIN) IMPLANT
DRAPE LAPAROSCOPIC ABDOMINAL (DRAPES) ×2 IMPLANT
DRAPE SHEET LG 3/4 BI-LAMINATE (DRAPES) IMPLANT
DRAPE UTILITY XL STRL (DRAPES) ×2 IMPLANT
DRAPE WARM FLUID 44X44 (DRAPE) ×2 IMPLANT
DRSG OPSITE POSTOP 4X10 (GAUZE/BANDAGES/DRESSINGS) IMPLANT
DRSG OPSITE POSTOP 4X6 (GAUZE/BANDAGES/DRESSINGS) IMPLANT
DRSG OPSITE POSTOP 4X8 (GAUZE/BANDAGES/DRESSINGS) ×2 IMPLANT
DRSG TEGADERM 2-3/8X2-3/4 SM (GAUZE/BANDAGES/DRESSINGS) ×4 IMPLANT
DRSG TEGADERM 4X4.75 (GAUZE/BANDAGES/DRESSINGS) ×2 IMPLANT
ELECT PENCIL ROCKER SW 15FT (MISCELLANEOUS) ×2 IMPLANT
ELECT REM PT RETURN 15FT ADLT (MISCELLANEOUS) ×2 IMPLANT
ENDOLOOP SUT PDS II  0 18 (SUTURE)
ENDOLOOP SUT PDS II 0 18 (SUTURE) IMPLANT
GAUZE SPONGE 2X2 8PLY STRL LF (GAUZE/BANDAGES/DRESSINGS) ×1 IMPLANT
GAUZE SPONGE 4X4 12PLY STRL (GAUZE/BANDAGES/DRESSINGS) IMPLANT
GLOVE ECLIPSE 8.0 STRL XLNG CF (GLOVE) ×2 IMPLANT
GLOVE INDICATOR 8.0 STRL GRN (GLOVE) ×2 IMPLANT
GOWN STRL REUS W/TWL XL LVL3 (GOWN DISPOSABLE) ×8 IMPLANT
HANDLE SUCTION POOLE (INSTRUMENTS) ×1 IMPLANT
IRRIG SUCT STRYKERFLOW 2 WTIP (MISCELLANEOUS) ×2
IRRIGATION SUCT STRKRFLW 2 WTP (MISCELLANEOUS) ×1 IMPLANT
KIT BASIN OR (CUSTOM PROCEDURE TRAY) ×2 IMPLANT
LEGGING LITHOTOMY PAIR STRL (DRAPES) ×2 IMPLANT
MESH PHASIX ST 15X20 (Mesh General) ×2 IMPLANT
PAD POSITIONING PINK XL (MISCELLANEOUS) ×2 IMPLANT
PORT LAP GEL ALEXIS MED 5-9CM (MISCELLANEOUS) ×2 IMPLANT
PROTECTOR NERVE ULNAR (MISCELLANEOUS) ×2 IMPLANT
RTRCTR WOUND ALEXIS 18CM MED (MISCELLANEOUS)
SCISSORS LAP 5X35 DISP (ENDOMECHANICALS) ×2 IMPLANT
SEALER TISSUE G2 STRG ARTC 35C (ENDOMECHANICALS) IMPLANT
SET TUBE SMOKE EVAC HIGH FLOW (TUBING) ×2 IMPLANT
SLEEVE XCEL OPT CAN 5 100 (ENDOMECHANICALS) ×4 IMPLANT
SPONGE GAUZE 2X2 STER 10/PKG (GAUZE/BANDAGES/DRESSINGS) ×1
SPONGE LAP 18X18 RF (DISPOSABLE) IMPLANT
STAPLER 90 3.5 STAND SLIM (STAPLE) ×2
STAPLER 90 3.5 STD SLIM (STAPLE) ×1 IMPLANT
STAPLER PROXIMATE 75MM BLUE (STAPLE) ×2 IMPLANT
STAPLER VISISTAT 35W (STAPLE) IMPLANT
SUCTION POOLE HANDLE (INSTRUMENTS) ×2
SUCTION POOLE TIP (SUCTIONS) ×2 IMPLANT
SURGILUBE 2OZ TUBE FLIPTOP (MISCELLANEOUS) IMPLANT
SUT MNCRL AB 4-0 PS2 18 (SUTURE) ×2 IMPLANT
SUT PDS AB 1 CT1 27 (SUTURE) ×4 IMPLANT
SUT PDS AB 1 CTX 36 (SUTURE) ×4 IMPLANT
SUT PDS AB 1 TP1 96 (SUTURE) IMPLANT
SUT PROLENE 0 CT 2 (SUTURE) IMPLANT
SUT SILK 2 0 (SUTURE) ×1
SUT SILK 2 0 SH CR/8 (SUTURE) ×4 IMPLANT
SUT SILK 2-0 18XBRD TIE 12 (SUTURE) ×1 IMPLANT
SUT SILK 3 0 (SUTURE) ×1
SUT SILK 3 0 SH CR/8 (SUTURE) IMPLANT
SUT SILK 3-0 18XBRD TIE 12 (SUTURE) ×1 IMPLANT
SYR BULB IRRIGATION 50ML (SYRINGE) IMPLANT
SYS LAPSCP GELPORT 120MM (MISCELLANEOUS)
SYSTEM LAPSCP GELPORT 120MM (MISCELLANEOUS) IMPLANT
TAPE UMBILICAL COTTON 1/8X30 (MISCELLANEOUS) ×2 IMPLANT
TOWEL OR 17X26 10 PK STRL BLUE (TOWEL DISPOSABLE) ×4 IMPLANT
TOWEL OR NON WOVEN STRL DISP B (DISPOSABLE) ×2 IMPLANT
TRAY FOLEY MTR SLVR 16FR STAT (SET/KITS/TRAYS/PACK) IMPLANT
TRAY LAPAROSCOPIC (CUSTOM PROCEDURE TRAY) ×2 IMPLANT
TROCAR BLADELESS OPT 5 100 (ENDOMECHANICALS) ×2 IMPLANT
TROCAR XCEL NON-BLD 11X100MML (ENDOMECHANICALS) IMPLANT
WATER STERILE IRR 1000ML POUR (IV SOLUTION) ×4 IMPLANT
YANKAUER SUCT BULB TIP 10FT TU (MISCELLANEOUS) ×2 IMPLANT

## 2018-05-04 NOTE — ED Notes (Signed)
Patient transported to CT and returned 

## 2018-05-04 NOTE — Consult Note (Signed)
Tanya Harris  [birth year] [id number]  CARE TEAM:  PCP: Darreld Mclean, MD  Outpatient Care Team: Patient Care Team: Copland, Gay Filler, MD as PCP - General (Family Medicine) Becky Sax, MD as Referring Physician (Orthopedic Surgery)  Inpatient Treatment Team: Treatment Team: Attending Provider: Margette Fast, MD; Physician Assistant: Bishop Dublin; Registered Nurse: Lilli Light, RN; Consulting Physician: Edison Pace, Md, MD   This patient is a 85 y.o.female who presents today for surgical evaluation at the request of Dr Nanda Quinton, [facility] ED.   Chief complaint / Reason for evaluation: Incarcerated left groin hernia with SBO  Elderly woman with history of scoliosis and some abdominal asymmetry.  Developed worsening abdominal pain and crampiness.  Noticed a lump in left groin.  Became firm and uncomfortable.  Concerning.  Went to the emergency room at [facility] in [facility].  CT scan notes small bowel going into hernia left groin.  They think it is a femoral hernia.  Emergency department has tried to reduce it with some pain medication & Trendelenburg positioning without success.  Surgical consultation requested.  Transfer to hospital where surgery can be done.  That will come to [facility] long hospital.  Patient did have a bowel movement earlier today but has had some crampiness and no flatus in a while.  She usually has some constipation but can get her bowels moving at least every other day.  She had an appendectomy done many years ago.  No other abdominal surgery.  She has some uterine fibroids.  Patient has COPD on inhalers but is not oxygen dependent.  She is never smoked.  She was involved with some abdominal wall trauma and noticed some left-sided swelling that concerned her.  She had a CAT scan in October thousand 19 which showed some abdominal wall asymmetry and stretching on the left side most likely felt to be due to her thin body habitus and scoliosis without a true  hernia.  She is lived in [place] but relocated to the [place] region in 2015.  She is hard of hearing.  She needs a walker for balance.  She has had a history of falls at home.  I did have knee surgery but no hip or neck fractures.  She has significant scoliosis with some spinal stenosis and some intermittent leg pain and neuropathy.  No evidence of any arterial insufficiency.  No dysuria or pyuria.   Assessment  Tanya Harris  85 y.o. female       Problem List:  Principal Problem:   Incarcerated left inguinal hernia Active Problems:   COPD with chronic bronchitis (HCC)   SBO (small bowel obstruction) (HCC)   Hyperlipidemia   Hearing loss   Scoliosis, levoconvex   Fibroid uterus   Abdominal wall asymmetry   Incarcerated left inguinal hernia not reducible with evidence of edema and inflammation on CAT scan containing small intestine.  Source of bowel obstruction  Plan:  Emergent operative exploration.  Start out laparoscopically.  It can be reduced and no evidence of any ischemia or cellulitis, plan hernia repair with mesh.  Most likely a Tapp type approach.  See if she has evidence of a left flank hernia as well versus significant diastases from her significant scoliosis which is the more likely etiology.  There is evidence of bowel ischemia or necrosis requiring resection, most likely reinforcement with more absorbable mesh such as Phasix.  The anatomy & physiology of the digestive tract was discussed.  The pathophysiology  of perforation was discussed.  Differential diagnosis such as perforated ulcer or colon, etc was discussed.   Natural history risks without surgery such as death was discussed.  I recommended abdominal exploration to diagnose & treat the source of the problem.  Laparoscopic & open techniques were discussed.   Reduction of small intestine out of hernia.  Possible bowel resection.  Hernia repair.  Risks such as bleeding, infection, abscess, leak, reoperation, bowel  resection, possible ostomy, injury to other organs, need for repair of tissues / organs, hernia, heart attack, death, and other risks were discussed.   The risks of no intervention will lead to serious problems including death.   I expressed a good likelihood that surgery will address the problem.    Goals of post-operative recovery were discussed as well.  We will work to minimize complications although risks in an emergent setting are high.   Questions were answered.  The patient expressed understanding & wishes to proceed with surgery.  Patient's husband and daughter were present.  Questions answered.  They agree with surgery as well.      -COPD.  Not oxygen dependent.  Stable on inhalers and Flonase.  Follow. -VTE prophylaxis- SCDs, etc -mobilize as tolerated to help recovery  50 minutes spent in review, evaluation, examination, counseling, and coordination of care.  More than 50% of that time was spent in counseling.  Adin Hector, MD, FACS, MASCRS Gastrointestinal and Minimally Invasive Surgery    [address] [place], [place] [address] [phone] Main / Paging [phone] Fax   05/04/2018      Past Medical History:  Diagnosis Date  . Coccygeal contusion 05/02/2015  . COPD (chronic obstructive pulmonary disease) (HCC)    Mild  . DJD (degenerative joint disease) of knee   . Rotator cuff tendinitis, left 01/03/2018  . Scoliosis   . Spinal stenosis     Past Surgical History:  Procedure Laterality Date  . APPENDECTOMY  2005  . CATARACT EXTRACTION     Bilateral  . NASAL SINUS SURGERY    . TONSILLECTOMY    . TOTAL KNEE ARTHROPLASTY  2001   Left    Social History   Socioeconomic History  . Marital status: Married    Spouse name: Not on file  . Number of children: Not on file  . Years of education: Not on file  . Highest education level: Not on file  Occupational History  . Not on file  Social Needs  . Financial resource strain: Not on  file  . Food insecurity:    Worry: Not on file    Inability: Not on file  . Transportation needs:    Medical: Not on file    Non-medical: Not on file  Tobacco Use  . Smoking status: Never Smoker  . Smokeless tobacco: Never Used  Substance and Sexual Activity  . Alcohol use: No  . Drug use: Yes  . Sexual activity: Not on file  Lifestyle  . Physical activity:    Days per week: Not on file    Minutes per session: Not on file  . Stress: Not on file  Relationships  . Social connections:    Talks on phone: Not on file    Gets together: Not on file    Attends religious service: Not on file    Active member of club or organization: Not on file    Attends meetings of clubs or organizations: Not on file    Relationship  status: Not on file  . Intimate partner violence:    Fear of current or ex partner: Not on file    Emotionally abused: Not on file    Physically abused: Not on file    Forced sexual activity: Not on file  Other Topics Concern  . Not on file  Social History Narrative  . Not on file    Family History  Problem Relation Age of Onset  . Heart disease Mother 26       Deceased  . Diabetes Mother   . Heart disease Father 45       Deceased  . Asthma Father   . Heart disease Maternal Uncle   . Colon cancer Brother   . Diabetes Brother        #2  . Heart disease Brother        x2  . Healthy Sister   . Healthy Brother        x1  . Hyperlipidemia Son        x2    No current facility-administered medications for this encounter.    Current Outpatient Medications  Medication Sig Dispense Refill  . aspirin 81 MG tablet Take 81 mg by mouth daily.    . Calcium Carbonate-Vitamin D (CALCIUM 600+D) 600-400 MG-UNIT per tablet Take 2 tablets by mouth daily.    . clindamycin (CLEOCIN) 300 MG capsule Take 600 mg by mouth once prior to dental procedure 10 capsule 0  . fluticasone (FLONASE) 50 MCG/ACT nasal spray Place 2 sprays into both nostrils daily. 16 g 5  .  Fluticasone-Salmeterol (ADVAIR) 250-50 MCG/DOSE AEPB Inhale 1 puff into the lungs 2 (two) times daily. 60 each 5  . glucosamine-chondroitin 500-400 MG tablet Take 1 tablet by mouth daily.     . meloxicam (MOBIC) 15 MG tablet TAKE 1 TABLET(15 MG) BY MOUTH DAILY AS NEEDED FOR PAIN 90 tablet 1  . Multiple Vitamin (MULTIVITAMIN) tablet Take 1 tablet by mouth daily.    Marland Kitchen omega-3 fish oil (MAXEPA) 1000 MG CAPS capsule Take 2 capsules by mouth daily. TAKING 1200mg  DAILY       Allergies  Allergen Reactions  . Amoxicillin Nausea And Vomiting  . Ibuprofen Swelling    Mouth Swelling- however pt confirms that she is able to take mobic with no problems 11/2016     ROS:   All other systems reviewed & are negative except per HPI or as noted below: Constitutional:  No fevers, chills, sweats.  Weight stable Eyes:  No vision changes, No discharge HENT:  No sore throats, nasal drainage Lymph: No neck swelling, No bruising easily Pulmonary:  No cough, productive sputum CV: No orthopnea, PND  Patient walks 10 minutes the help of a walker or occasionally a cane for balance.  No exertional chest/neck/shoulder/arm pain. GI: No personal nor family history of GI/colon cancer, inflammatory bowel disease, irritable bowel syndrome, allergy such as Celiac Sprue, dietary/dairy problems, colitis, ulcers nor gastritis.  No recent sick contacts/gastroenteritis.  No travel outside the country.  No changes in diet. Renal: No UTIs, No hematuria Genital:  No drainage, bleeding, masses Musculoskeletal: Chronic knee and back pain from osteoarthritis.  Good ROM major joints Skin:  No sores or lesions.  No rashes Heme/Lymph:  No easy bleeding.  No swollen lymph nodes Neuro: No focal weakness/numbness.  No seizures Psych: No suicidal ideation.  No hallucinations  BP (!) 153/66 (BP Location: Left Arm)   Pulse 92   Temp 97.6 F (36.4 C) (Oral)  Resp 20   Ht 5\' 1"  (1.549 m)   Wt 67.6 kg   SpO2 100%   BMI 28.15 kg/m    Physical Exam: General: Pt awake/alert/oriented x4 in mild major acute distress.  Comfortable.  Not toxic.  Not sickly. Eyes: PERRL, normal EOM. Sclera nonicteric Neuro: CN II-XII intact w/o focal sensory/motor deficits. Lymph: No head/neck/groin lymphadenopathy Psych:  No delerium/psychosis/paranoia HENT: Normocephalic, Mucus membranes moist.  No thrush.  Hard of hearing Neck: Supple, No tracheal deviation Chest: No pain.  Good respiratory excursion. CV:  Pulses intact.  Regular rhythm Abdomen: Soft, Nondistended.  Nontender.  Significant eventration towards left flank.  Possible flank hernia as well but soft and reducible.    Gen: Left groin mass 7 x 5 x 5 cm with some edema and pain.  Not reducible.  Correlates with the small intestine within a left inguinal hernia by my viewing CAT scan.  Seems rather superior to be a femoral hernia.  Seems more consistent with an inguinal hernia.  No obvious right inguinal hernia.  Left side marked by me.   Ext:  SCDs BLE.  No significant edema.  No cyanosis Skin: No petechiae / purpurea.  No major sores Musculoskeletal: No severe joint pain.  Good ROM major joints   Results:   Labs: Results for orders placed or performed during the hospital encounter of 05/04/18 (from the past 48 hour(s))  CBC with Differential     Status: Abnormal   Collection Time: 05/04/18  4:00 PM  Result Value Ref Range   WBC 10.0 4.0 - 10.5 K/uL   RBC 4.66 3.87 - 5.11 MIL/uL   Hemoglobin 13.8 12.0 - 15.0 g/dL   HCT 42.7 36.0 - 46.0 %   MCV 91.6 80.0 - 100.0 fL   MCH 29.6 26.0 - 34.0 pg   MCHC 32.3 30.0 - 36.0 g/dL   RDW 13.2 11.5 - 15.5 %   Platelets 276 150 - 400 K/uL   nRBC 0.0 0.0 - 0.2 %   Neutrophils Relative % 91 %   Neutro Abs 9.1 (H) 1.7 - 7.7 K/uL   Lymphocytes Relative 6 %   Lymphs Abs 0.6 (L) 0.7 - 4.0 K/uL   Monocytes Relative 2 %   Monocytes Absolute 0.2 0.1 - 1.0 K/uL   Eosinophils Relative 0 %   Eosinophils Absolute 0.0 0.0 - 0.5 K/uL    Basophils Relative 1 %   Basophils Absolute 0.1 0.0 - 0.1 K/uL   Immature Granulocytes 0 %   Abs Immature Granulocytes 0.03 0.00 - 0.07 K/uL    Comment: Performed at [facility], [address] [place], [place] [address]  Comprehensive metabolic panel     Status: Abnormal   Collection Time: 05/04/18  4:00 PM  Result Value Ref Range   Sodium 136 135 - 145 mmol/L   Potassium 4.3 3.5 - 5.1 mmol/L   Chloride 104 98 - 111 mmol/L   CO2 23 22 - 32 mmol/L   Glucose, Bld 133 (H) 70 - 99 mg/dL   BUN 28 (H) 8 - 23 mg/dL   Creatinine, Ser 0.49 0.44 - 1.00 mg/dL   Calcium 9.4 8.9 - 10.3 mg/dL   Total Protein 7.3 6.5 - 8.1 g/dL   Albumin 4.0 3.5 - 5.0 g/dL   AST 28 15 - 41 U/L   ALT 16 0 - 44 U/L   Alkaline Phosphatase 81 38 - 126 U/L   Total Bilirubin 0.8 0.3 - 1.2 mg/dL  GFR calc non Af Amer >60 >60 mL/min   GFR calc Af Amer >60 >60 mL/min   Anion gap 9 5 - 15    Comment: Performed at [facility], [address] [place], [place] [address]  Lactic acid, plasma     Status: None   Collection Time: 05/04/18  4:43 PM  Result Value Ref Range   Lactic Acid, Venous 0.9 0.5 - 1.9 mmol/L    Comment: Performed at [facility], [address] [place], [place] [address]    Imaging / Studies: Ct Abdomen Pelvis W Contrast  Result Date: 05/04/2018 CLINICAL DATA:  Lump in left groin area, evaluate for hernia EXAM: CT ABDOMEN AND PELVIS WITH CONTRAST TECHNIQUE: Multidetector CT imaging of the abdomen and pelvis was performed using the standard protocol following bolus administration of intravenous contrast. CONTRAST:  180mL OMNIPAQUE IOHEXOL 300 MG/ML  SOLN COMPARISON:  12/21/2016 FINDINGS: Lower chest: Lung bases are clear. Hepatobiliary: Liver is within normal limits. Gallbladder is unremarkable. No intrahepatic or extrahepatic ductal dilatation. Pancreas: Within normal limits. Spleen: Within normal limits. Adrenals/Urinary Tract: Adrenal glands are within normal limits.  Kidneys are within normal limits.  No hydronephrosis. Bladder is within normal limits. Stomach/Bowel: Stomach is within normal limits. Prior appendectomy. Diastasis of the left lateral abdominal wall with multiple loops of small bowel (series 2/image 32), chronic. Moderate left femoral hernia containing loops of small bowel (series 2/image 35), previously fat containing. Dilated small bowel entering the hernia (series 2/image 62) and decompressed loops of distal small bowel exiting the hernia (series 2/image 64), indicating that the hernia is obstructive. Colon is relatively decompressed. Sigmoid diverticulosis, without evidence of diverticulitis. Vascular/Lymphatic: No evidence of abdominal aortic aneurysm. Atherosclerotic calcifications of the abdominal aorta and branch vessels. No suspicious abdominopelvic lymphadenopathy. Reproductive: Uterus is notable for a dominant 4.0 cm subserosal fibroid in the posterior uterine fundus. No adnexal masses. Other: No abdominopelvic ascites. Musculoskeletal: Degenerative changes of the visualized thoracolumbar spine. IMPRESSION: Moderate left femoral hernia containing loops of small bowel, new/progressive, previously fat containing. At least some degree of small bowel obstruction is present on the basis of the hernia. Correlate for reducibility/incarceration. Surgical consultation is suggested. Additional stable ancillary findings as above. Electronically Signed   By: Julian Hy M.D.   On: 05/04/2018 17:32    Medications / Allergies: per chart  Antibiotics: Anti-infectives (From admission, onward)   None        Note: Portions of this report may have been transcribed using voice recognition software. Every effort was made to ensure accuracy; however, inadvertent computerized transcription errors may be present.   Any transcriptional errors that result from this process are unintentional.    Adin Hector, MD, FACS, MASCRS Gastrointestinal and  Minimally Invasive Surgery    [address] [place], [place] [address] [phone] Main / Paging [phone] Fax   05/04/2018

## 2018-05-04 NOTE — ED Notes (Signed)
ED Provider at bedside. 

## 2018-05-04 NOTE — Anesthesia Procedure Notes (Addendum)
Procedure Name: Intubation Date/Time: 05/04/2018 9:50 PM Performed by: Lissa Morales, CRNA Pre-anesthesia Checklist: Patient identified, Emergency Drugs available, Suction available and Patient being monitored Patient Re-evaluated:Patient Re-evaluated prior to induction Oxygen Delivery Method: Circle system utilized Preoxygenation: Pre-oxygenation with 100% oxygen Induction Type: IV induction, Rapid sequence and Cricoid Pressure applied Ventilation: Mask ventilation without difficulty Laryngoscope Size: Mac and 4 Tube type: Subglottic suction tube Tube size: 7.0 mm Number of attempts: 1 Airway Equipment and Method: Stylet and Oral airway Placement Confirmation: ETT inserted through vocal cords under direct vision,  positive ETCO2 and breath sounds checked- equal and bilateral Secured at: 21 cm Tube secured with: Tape Dental Injury: Teeth and Oropharynx as per pre-operative assessment

## 2018-05-04 NOTE — ED Notes (Signed)
ED Provider at bedside. Dr. Long 

## 2018-05-04 NOTE — ED Provider Notes (Signed)
[facility] EMERGENCY DEPARTMENT Provider Note   CSN: [id number] Arrival date & time: 05/04/18  1523    History   Chief Complaint Chief Complaint  Patient presents with  . Abdominal Pain  . Hernia    HPI Tanya Harris is a 85 y.o. female.    HPI   Patient is an 85 year old female history of COPD, HLD, DJD, scoliosis, who presents the emergency department today complaining of lower abdominal pain.  Patient states that she has had gas all day and she attempted to have a bowel movement this afternoon and had worsening of her lower abdominal pain.  Describes pain as a discomfort.  Rates it a 7/10.  It has been constant since onset.  States after her BM she was pushing on her lower abdomen and noticed a knot in the left inguinal region which she has never had before.  She denies any fevers, nausea, vomiting. states that she had a normal bowel movement this morning that was not hard.  No diarrhea.  No bloody stools.  No urinary symptoms.  She last ate at 6 AM.  Past Medical History:  Diagnosis Date  . Buckle type impacted fracture of the distal left radial metaphysis 05/04/2014  . Closed nondisplaced fracture of styloid process of left ulna 05/04/2014  . Coccygeal contusion 05/02/2015  . COPD (chronic obstructive pulmonary disease) (HCC)    Mild  . DJD (degenerative joint disease) of knee   . Fall at home 05/04/2018  . Rotator cuff tendinitis, left 01/03/2018  . Scoliosis   . Spinal stenosis     Patient Active Problem List   Diagnosis Date Noted  . Scoliosis, levoconvex 05/04/2018  . Incarcerated left inguinal hernia 05/04/2018  . SBO (small bowel obstruction) ([place]) 05/04/2018  . Fibroid uterus 05/04/2018  . Abdominal wall asymmetry 05/04/2018  . Diverticulosis of left colon 05/04/2018  . Hyperglycemia 05/04/2018  . Spinal stenosis   . Rotator cuff tendinitis, left 01/03/2018  . History of excessive cerumen 12/04/2017  . Chronic pain of left knee 10/01/2017  . Fall at  home 05/03/2017  . Lesion of skin of breast 12/12/2015  . Screening, ischemic heart disease 12/08/2014  . Hearing loss 12/08/2014  . Hyperlipidemia 06/04/2014  . Buckle type impacted fracture of the distal left radial metaphysis 05/04/2014  . Closed nondisplaced fracture of styloid process of left ulna 05/04/2014  . Chronic allergic rhinitis 04/15/2014  . Medicare annual wellness visit, subsequent 11/30/2013  . COPD with chronic bronchitis ([place]) 11/02/2013  . Osteopenia 11/02/2013  . Gait instability 11/02/2013    Past Surgical History:  Procedure Laterality Date  . APPENDECTOMY  2005  . CATARACT EXTRACTION     Bilateral  . NASAL SINUS SURGERY    . TONSILLECTOMY    . TOTAL KNEE ARTHROPLASTY  2001   Left     OB History   No obstetric history on file.      Home Medications    Prior to Admission medications   Medication Sig Start Date End Date Taking? Authorizing Provider  aspirin 81 MG tablet Take 81 mg by mouth daily.    [provider]  Calcium Carbonate-Vitamin D (CALCIUM 600+D) 600-400 MG-UNIT per tablet Take 2 tablets by mouth daily.    [provider]  clindamycin (CLEOCIN) 300 MG capsule Take 600 mg by mouth once prior to dental procedure 10/16/17   Copland, Gay Filler, MD  fluticasone (FLONASE) 50 MCG/ACT nasal spray Place 2 sprays into both nostrils daily. 10/01/17  Carollee Herter, Yvonne R, DO  Fluticasone-Salmeterol (ADVAIR) 250-50 MCG/DOSE AEPB Inhale 1 puff into the lungs 2 (two) times daily. 12/18/17   Copland, Gay Filler, MD  glucosamine-chondroitin 500-400 MG tablet Take 1 tablet by mouth daily.     [provider]  meloxicam (MOBIC) 15 MG tablet TAKE 1 TABLET(15 MG) BY MOUTH DAILY AS NEEDED FOR PAIN 12/19/17   Copland, Gay Filler, MD  Multiple Vitamin (MULTIVITAMIN) tablet Take 1 tablet by mouth daily.    [provider]  omega-3 fish oil (MAXEPA) 1000 MG CAPS capsule Take 2 capsules by mouth daily. TAKING 1200mg  DAILY     [provider]    Family History Family History  Problem Relation Age of Onset  . Heart disease Mother 73       Deceased  . Diabetes Mother   . Heart disease Father 33       Deceased  . Asthma Father   . Heart disease Maternal Uncle   . Colon cancer Brother   . Diabetes Brother        #2  . Heart disease Brother        x2  . Healthy Sister   . Healthy Brother        x1  . Hyperlipidemia Son        x2    Social History Social History   Tobacco Use  . Smoking status: Never Smoker  . Smokeless tobacco: Never Used  Substance Use Topics  . Alcohol use: No  . Drug use: Yes     Allergies   Amoxicillin and Ibuprofen   Review of Systems Review of Systems  Constitutional: Negative for chills and fever.  HENT: Negative for ear pain and sore throat.   Eyes: Negative for visual disturbance.  Respiratory: Negative for cough and shortness of breath.   Cardiovascular: Negative for chest pain.  Gastrointestinal: Positive for abdominal pain. Negative for constipation, diarrhea, nausea and vomiting.  Genitourinary: Negative for dysuria, flank pain and hematuria.  Musculoskeletal: Negative for back pain.  Skin: Negative for rash.  Neurological: Negative for headaches.  All other systems reviewed and are negative.    Physical Exam Updated Vital Signs BP (!) 157/72 (BP Location: Right Arm)   Pulse 87   Temp 97.6 F (36.4 C) (Oral)   Resp 18   Ht 5\' 1"  (1.549 m)   Wt 67.6 kg   SpO2 97%   BMI 28.15 kg/m   Physical Exam Vitals signs and nursing note reviewed.  Constitutional:      General: She is not in acute distress.    Appearance: She is well-developed.  HENT:     Head: Normocephalic and atraumatic.  Eyes:     Conjunctiva/sclera: Conjunctivae normal.  Neck:     Musculoskeletal: Neck supple.  Cardiovascular:     Rate and Rhythm: Normal rate and regular rhythm.     Heart sounds: No murmur.  Pulmonary:     Effort: Pulmonary effort is normal. No  respiratory distress.     Breath sounds: Normal breath sounds.  Abdominal:     Palpations: Abdomen is soft.     Comments: TTP to the left inguinal region with 4cm x4cm hardened area that is slightly erythematous. She has mild TTP to the LLQ/periumbilical area as well. Normoactive bowel sounds. No diffuse abd rigidity or rebound tenderness.  Skin:    General: Skin is warm and dry.  Neurological:     Mental Status: She is alert.  ED Treatments / Results  Labs (all labs ordered are listed, but only abnormal results are displayed) Labs Reviewed  CBC WITH DIFFERENTIAL/PLATELET - Abnormal; Notable for the following components:      Result Value   Neutro Abs 9.1 (*)    Lymphs Abs 0.6 (*)    All other components within normal limits  COMPREHENSIVE METABOLIC PANEL - Abnormal; Notable for the following components:   Glucose, Bld 133 (*)    BUN 28 (*)    All other components within normal limits  LACTIC ACID, PLASMA  URINALYSIS, ROUTINE W REFLEX MICROSCOPIC  LACTIC ACID, PLASMA    EKG None  Radiology Ct Abdomen Pelvis W Contrast  Result Date: 05/04/2018 CLINICAL DATA:  Lump in left groin area, evaluate for hernia EXAM: CT ABDOMEN AND PELVIS WITH CONTRAST TECHNIQUE: Multidetector CT imaging of the abdomen and pelvis was performed using the standard protocol following bolus administration of intravenous contrast. CONTRAST:  177mL OMNIPAQUE IOHEXOL 300 MG/ML  SOLN COMPARISON:  12/21/2016 FINDINGS: Lower chest: Lung bases are clear. Hepatobiliary: Liver is within normal limits. Gallbladder is unremarkable. No intrahepatic or extrahepatic ductal dilatation. Pancreas: Within normal limits. Spleen: Within normal limits. Adrenals/Urinary Tract: Adrenal glands are within normal limits. Kidneys are within normal limits.  No hydronephrosis. Bladder is within normal limits. Stomach/Bowel: Stomach is within normal limits. Prior appendectomy. Diastasis of the left lateral abdominal wall with multiple  loops of small bowel (series 2/image 32), chronic. Moderate left femoral hernia containing loops of small bowel (series 2/image 35), previously fat containing. Dilated small bowel entering the hernia (series 2/image 62) and decompressed loops of distal small bowel exiting the hernia (series 2/image 64), indicating that the hernia is obstructive. Colon is relatively decompressed. Sigmoid diverticulosis, without evidence of diverticulitis. Vascular/Lymphatic: No evidence of abdominal aortic aneurysm. Atherosclerotic calcifications of the abdominal aorta and branch vessels. No suspicious abdominopelvic lymphadenopathy. Reproductive: Uterus is notable for a dominant 4.0 cm subserosal fibroid in the posterior uterine fundus. No adnexal masses. Other: No abdominopelvic ascites. Musculoskeletal: Degenerative changes of the visualized thoracolumbar spine. IMPRESSION: Moderate left femoral hernia containing loops of small bowel, new/progressive, previously fat containing. At least some degree of small bowel obstruction is present on the basis of the hernia. Correlate for reducibility/incarceration. Surgical consultation is suggested. Additional stable ancillary findings as above. Electronically Signed   By: Julian Hy M.D.   On: 05/04/2018 17:32    Procedures Procedures (including critical care time)  Medications Ordered in ED Medications  morphine 4 MG/ML injection 4 mg (4 mg Intravenous Given 05/04/18 1612)  sodium chloride 0.9 % bolus 500 mL (0 mLs Intravenous Stopped 05/04/18 1715)  iohexol (OMNIPAQUE) 300 MG/ML solution 100 mL (100 mLs Intravenous Contrast Given 05/04/18 1646)  morphine 4 MG/ML injection 4 mg (4 mg Intravenous Given 05/04/18 1853)     Initial Impression / Assessment and Plan / ED Course  I have reviewed the triage vital signs and the nursing notes.  Pertinent labs & imaging results that were available during my care of the patient were reviewed by me and considered in my medical  decision making (see chart for details).        Final Clinical Impressions(s) / ED Diagnoses   Final diagnoses:  Non-recurrent unilateral femoral hernia with obstruction without gangrene  Femoral hernia with obstruction without gangrene, recurrence not specified, unspecified laterality   Pt presenting with 1 day h/o abd pain and swelling to the left inguinal region. No fevers, slightly hypertensive here but otherwise vital signs  are reassuring.  On exam pt does have swelling and firmness to the left inguinal area with some erythema to the skin concerning for inguinal vs femoral hernia. On initial evaluation this area is nonreducible. Will order lab work, ct abd/pelvis, and pain medications and attempt reduction again following administration of pain medications.   CBC is unremarkable. CMP with slightly elevated BUN but normal creatinine, electrolytes and liver function. Lactic acid is negative UA pending at time of transfer  Case was staffed with Dr. Laverta Baltimore, supervising physician who personally evaluated the pt and also attempted to reduce the hernia without success.  CT abd/ pelvis with moderate left femoral hernia containing loops of small bowel, new/progressive, previously fat containing. At least some degree of small bowel obstruction is present on the basis of the hernia.  Dr. Laverta Baltimore discussed patient case with Dr. gross who recommends ED to ED transfer and he will evaluate the patient in the ED and attempt reduction of the hernia.  Dr. Laverta Baltimore also spoke with Dr. Zenia Resides who accepts patient for transfer to a single ED.  6:40 PM [facility] in route.  Pt transferred to [facility] long ED.  ED Discharge Orders    None       Bishop Dublin 05/04/18 1945    Margette Fast, MD 05/05/18 [id number]

## 2018-05-04 NOTE — Anesthesia Preprocedure Evaluation (Signed)
Anesthesia Evaluation  Patient identified by MRN, date of birth, ID band Patient awake    Reviewed: Allergy & Precautions, NPO status , Patient's Chart, lab work & pertinent test results  Airway Mallampati: I  TM Distance: >3 FB Neck ROM: Full    Dental   Pulmonary COPD,    Pulmonary exam normal        Cardiovascular Normal cardiovascular exam     Neuro/Psych    GI/Hepatic   Endo/Other    Renal/GU      Musculoskeletal   Abdominal   Peds  Hematology   Anesthesia Other Findings   Reproductive/Obstetrics                             Anesthesia Physical Anesthesia Plan  ASA: III  Anesthesia Plan: General   Post-op Pain Management:    Induction: Intravenous, Rapid sequence and Cricoid pressure planned  PONV Risk Score and Plan: 3 and Ondansetron and Treatment may vary due to age or medical condition  Airway Management Planned: Oral ETT  Additional Equipment:   Intra-op Plan:   Post-operative Plan: Extubation in OR  Informed Consent: I have reviewed the patients History and Physical, chart, labs and discussed the procedure including the risks, benefits and alternatives for the proposed anesthesia with the patient or authorized representative who has indicated his/her understanding and acceptance.       Plan Discussed with: CRNA and Surgeon  Anesthesia Plan Comments:         Anesthesia Quick Evaluation

## 2018-05-04 NOTE — ED Notes (Signed)
CT must wait for results of bun/creat/egfr per [facility] radiology protocol

## 2018-05-04 NOTE — ED Triage Notes (Signed)
Reports lower abdominal pain which began this morning and now endorses "knot" to the left groin.

## 2018-05-04 NOTE — H&P (Signed)
Tanya Harris  Jul 03, 1932 [id number]  CARE TEAM:  PCP: Darreld Mclean, MD  Outpatient Care Team: Patient Care Team: Copland, Gay Filler, MD as PCP - General (Family Medicine) Becky Sax, MD as Referring Physician (Orthopedic Surgery)  Inpatient Treatment Team: Treatment Team: Attending Provider: Margette Fast, MD; Physician Assistant: Bishop Dublin; Registered Nurse: Lilli Light, RN; Consulting Physician: Edison Pace, Md, MD   This patient is a 85 y.o.female who presents today for surgical evaluation at the request of Dr Nanda Quinton, [facility].   Chief complaint / Reason for evaluation: Incarcerated left groin hernia with SBO  Elderly woman with history of scoliosis and some abdominal asymmetry.  Developed worsening abdominal pain and crampiness.  Noticed a lump in left groin.  Became firm and uncomfortable.  Concerning.  Went to the emergency room at [facility] in [facility]  CT scan notes small bowel going into hernia left groin.  They think it is a femoral hernia.  Emergency department has tried to reduce it with some pain medication & Trendelenburg positioning without success.  Surgical consultation requested.  Transfer to hospital where surgery can be done.  That will come to [facility] long hospital.  Patient did have a bowel movement earlier today but has had some crampiness and no flatus in a while.  She usually has some constipation but can get her bowels moving at least every other day.  She had an appendectomy done many years ago.  No other abdominal surgery.  She has some uterine fibroids.  Patient has COPD on inhalers but is not oxygen dependent.  She is never smoked.  She was involved with some abdominal wall trauma and noticed some left-sided swelling that concerned her.  She had a CAT scan in October thousand 19 which showed some abdominal wall asymmetry and stretching on the left side most likely felt to be due to her thin body habitus and scoliosis without a true  hernia.  She is lived in [place] but relocated to the [place] region in 2015.  She is hard of hearing.  She needs a walker for balance.  She has had a history of falls at home.  I did have knee surgery but no hip or neck fractures.  She has significant scoliosis with some spinal stenosis and some intermittent leg pain and neuropathy.  No evidence of any arterial insufficiency.  No dysuria or pyuria.   Assessment  Tanya Harris  85 y.o. female       Problem List:  Principal Problem:   Incarcerated left inguinal hernia Active Problems:   COPD with chronic bronchitis (HCC)   SBO (small bowel obstruction) (HCC)   Hyperlipidemia   Hearing loss   Scoliosis, levoconvex   Fibroid uterus   Abdominal wall asymmetry   Incarcerated left inguinal hernia not reducible with evidence of edema and inflammation on CAT scan containing small intestine.  Source of bowel obstruction  Plan:  Emergent operative exploration.  Start out laparoscopically.  It can be reduced and no evidence of any ischemia or cellulitis, plan hernia repair with mesh.  Most likely a Tapp type approach.  See if she has evidence of a left flank hernia as well versus significant diastases from her significant scoliosis which is the more likely etiology.  Would hold off on doing any complicated repair on that side unless there is a more discrete defect as well but is a potential source of obstruction.  I suspect either significant abdominal wall  diastases or a large defect not requiring emergent surgery.  If there is evidence of bowel ischemia or necrosis requiring test no resection, most likely primary inguinal hernia repair with possible reinforcement with more absorbable mesh such as Phasix.  The anatomy & physiology of the digestive tract was discussed.  The pathophysiology of perforation was discussed.  Differential diagnosis such as perforated ulcer or colon, etc was discussed.   Natural history risks without surgery such as  death was discussed.  I recommended abdominal exploration to diagnose & treat the source of the problem.  Laparoscopic & open techniques were discussed.   Reduction of small intestine out of hernia.  Possible bowel resection.  Hernia repair.  Risks such as bleeding, infection, abscess, leak, reoperation, bowel resection, possible ostomy, injury to other organs, need for repair of tissues / organs, hernia, heart attack, death, and other risks were discussed.   The risks of no intervention will lead to serious problems including death.   I expressed a good likelihood that surgery will address the problem.    Goals of post-operative recovery were discussed as well.  We will work to minimize complications although risks in an emergent setting are high.   Questions were answered.  The patient expressed understanding & wishes to proceed with surgery.  Patient's husband and daughter were present.  Questions answered.  They agree with surgery as well.      -COPD.  Not oxygen dependent.  Stable on inhalers and Flonase.  Follow. -History of gait instability with falls.  No evidence of any neurological defects at this time.  Most likely would benefit from physical occupational therapy evaluation. -Hyperglycemia mild with normal hemoglobin A1c.  Follow. -VTE prophylaxis- SCDs, etc -mobilize as tolerated to help recovery  50 minutes spent in review, evaluation, examination, counseling, and coordination of care.  More than 50% of that time was spent in counseling.  Adin Hector, MD, FACS, MASCRS Gastrointestinal and Minimally Invasive Surgery    [address] [place], [place] [address] [phone] Main / Paging [phone] Fax   05/04/2018      Past Medical History:  Diagnosis Date  . Coccygeal contusion 05/02/2015  . COPD (chronic obstructive pulmonary disease) (HCC)    Mild  . DJD (degenerative joint disease) of knee   . Rotator cuff tendinitis, left 01/03/2018  .  Scoliosis   . Spinal stenosis     Past Surgical History:  Procedure Laterality Date  . APPENDECTOMY  2005  . CATARACT EXTRACTION     Bilateral  . NASAL SINUS SURGERY    . TONSILLECTOMY    . TOTAL KNEE ARTHROPLASTY  2001   Left    Social History   Socioeconomic History  . Marital status: Married    Spouse name: Not on file  . Number of children: Not on file  . Years of education: Not on file  . Highest education level: Not on file  Occupational History  . Not on file  Social Needs  . Financial resource strain: Not on file  . Food insecurity:    Worry: Not on file    Inability: Not on file  . Transportation needs:    Medical: Not on file    Non-medical: Not on file  Tobacco Use  . Smoking status: Never Smoker  . Smokeless tobacco: Never Used  Substance and Sexual Activity  . Alcohol use: No  . Drug use: Yes  . Sexual activity: Not on file  Lifestyle  .  Physical activity:    Days per week: Not on file    Minutes per session: Not on file  . Stress: Not on file  Relationships  . Social connections:    Talks on phone: Not on file    Gets together: Not on file    Attends religious service: Not on file    Active member of club or organization: Not on file    Attends meetings of clubs or organizations: Not on file    Relationship status: Not on file  . Intimate partner violence:    Fear of current or ex partner: Not on file    Emotionally abused: Not on file    Physically abused: Not on file    Forced sexual activity: Not on file  Other Topics Concern  . Not on file  Social History Narrative  . Not on file    Family History  Problem Relation Age of Onset  . Heart disease Mother 88       Deceased  . Diabetes Mother   . Heart disease Father 42       Deceased  . Asthma Father   . Heart disease Maternal Uncle   . Colon cancer Brother   . Diabetes Brother        #2  . Heart disease Brother        x2  . Healthy Sister   . Healthy Brother        x1  .  Hyperlipidemia Son        x2    No current facility-administered medications for this encounter.    Current Outpatient Medications  Medication Sig Dispense Refill  . aspirin 81 MG tablet Take 81 mg by mouth daily.    . Calcium Carbonate-Vitamin D (CALCIUM 600+D) 600-400 MG-UNIT per tablet Take 2 tablets by mouth daily.    . clindamycin (CLEOCIN) 300 MG capsule Take 600 mg by mouth once prior to dental procedure 10 capsule 0  . fluticasone (FLONASE) 50 MCG/ACT nasal spray Place 2 sprays into both nostrils daily. 16 g 5  . Fluticasone-Salmeterol (ADVAIR) 250-50 MCG/DOSE AEPB Inhale 1 puff into the lungs 2 (two) times daily. 60 each 5  . glucosamine-chondroitin 500-400 MG tablet Take 1 tablet by mouth daily.     . meloxicam (MOBIC) 15 MG tablet TAKE 1 TABLET(15 MG) BY MOUTH DAILY AS NEEDED FOR PAIN 90 tablet 1  . Multiple Vitamin (MULTIVITAMIN) tablet Take 1 tablet by mouth daily.    Marland Kitchen omega-3 fish oil (MAXEPA) 1000 MG CAPS capsule Take 2 capsules by mouth daily. TAKING 1200mg  DAILY       Allergies  Allergen Reactions  . Amoxicillin Nausea And Vomiting  . Ibuprofen Swelling    Mouth Swelling- however pt confirms that she is able to take mobic with no problems 11/2016     ROS:   All other systems reviewed & are negative except per HPI or as noted below: Constitutional:  No fevers, chills, sweats.  Weight stable Eyes:  No vision changes, No discharge HENT:  No sore throats, nasal drainage Lymph: No neck swelling, No bruising easily Pulmonary:  No cough, productive sputum CV: No orthopnea, PND  Patient walks 10 minutes the help of a walker or occasionally a cane for balance.  No exertional chest/neck/shoulder/arm pain. GI: No personal nor family history of GI/colon cancer, inflammatory bowel disease, irritable bowel syndrome, allergy such as Celiac Sprue, dietary/dairy problems, colitis, ulcers nor gastritis.  No recent sick contacts/gastroenteritis.  No  travel outside the country.  No  changes in diet. Renal: No UTIs, No hematuria Genital:  No drainage, bleeding, masses Musculoskeletal: Chronic knee and back pain from osteoarthritis.  Good ROM major joints Skin:  No sores or lesions.  No rashes Heme/Lymph:  No easy bleeding.  No swollen lymph nodes Neuro: No focal weakness/numbness.  No seizures Psych: No suicidal ideation.  No hallucinations  BP (!) 153/66 (BP Location: Left Arm)   Pulse 92   Temp 97.6 F (36.4 C) (Oral)   Resp 20   Ht 5\' 1"  (1.549 m)   Wt 67.6 kg   SpO2 100%   BMI 28.15 kg/m   Physical Exam: General: Pt awake/alert/oriented x4 in mild major acute distress.  Comfortable.  Not toxic.  Not sickly. Eyes: PERRL, normal EOM. Sclera nonicteric Neuro: CN II-XII intact w/o focal sensory/motor deficits. Lymph: No head/neck/groin lymphadenopathy Psych:  No delerium/psychosis/paranoia HENT: Normocephalic, Mucus membranes moist.  No thrush.  Hard of hearing Neck: Supple, No tracheal deviation Chest: No pain.  Good respiratory excursion. CV:  Pulses intact.  Regular rhythm Abdomen: Soft, Nondistended.  Nontender.  Significant eventration towards left flank.  Possible flank hernia as well but soft and reducible.    Gen: Left groin mass 7 x 5 x 5 cm with some edema and pain.  Not reducible.  Correlates with the small intestine within a left inguinal hernia by my viewing CAT scan.  Seems rather superior to be a femoral hernia.  Seems more consistent with an inguinal hernia.  No obvious right inguinal hernia.  Left side marked by me.   Ext:  SCDs BLE.  No significant edema.  No cyanosis Skin: No petechiae / purpurea.  No major sores Musculoskeletal: No severe joint pain.  Good ROM major joints   Results:   Labs: Results for orders placed or performed during the hospital encounter of 05/04/18 (from the past 48 hour(s))  CBC with Differential     Status: Abnormal   Collection Time: 05/04/18  4:00 PM  Result Value Ref Range   WBC 10.0 4.0 - 10.5 K/uL    RBC 4.66 3.87 - 5.11 MIL/uL   Hemoglobin 13.8 12.0 - 15.0 g/dL   HCT 42.7 36.0 - 46.0 %   MCV 91.6 80.0 - 100.0 fL   MCH 29.6 26.0 - 34.0 pg   MCHC 32.3 30.0 - 36.0 g/dL   RDW 13.2 11.5 - 15.5 %   Platelets 276 150 - 400 K/uL   nRBC 0.0 0.0 - 0.2 %   Neutrophils Relative % 91 %   Neutro Abs 9.1 (H) 1.7 - 7.7 K/uL   Lymphocytes Relative 6 %   Lymphs Abs 0.6 (L) 0.7 - 4.0 K/uL   Monocytes Relative 2 %   Monocytes Absolute 0.2 0.1 - 1.0 K/uL   Eosinophils Relative 0 %   Eosinophils Absolute 0.0 0.0 - 0.5 K/uL   Basophils Relative 1 %   Basophils Absolute 0.1 0.0 - 0.1 K/uL   Immature Granulocytes 0 %   Abs Immature Granulocytes 0.03 0.00 - 0.07 K/uL    Comment: Performed at [facility], [address] [place], [place] [address]  Comprehensive metabolic panel     Status: Abnormal   Collection Time: 05/04/18  4:00 PM  Result Value Ref Range   Sodium 136 135 - 145 mmol/L   Potassium 4.3 3.5 - 5.1 mmol/L   Chloride 104 98 - 111 mmol/L   CO2 23 22 - 32 mmol/L  Glucose, Bld 133 (H) 70 - 99 mg/dL   BUN 28 (H) 8 - 23 mg/dL   Creatinine, Ser 0.49 0.44 - 1.00 mg/dL   Calcium 9.4 8.9 - 10.3 mg/dL   Total Protein 7.3 6.5 - 8.1 g/dL   Albumin 4.0 3.5 - 5.0 g/dL   AST 28 15 - 41 U/L   ALT 16 0 - 44 U/L   Alkaline Phosphatase 81 38 - 126 U/L   Total Bilirubin 0.8 0.3 - 1.2 mg/dL   GFR calc non Af Amer >60 >60 mL/min   GFR calc Af Amer >60 >60 mL/min   Anion gap 9 5 - 15    Comment: Performed at [facility], [address] [place], [place] [address]  Lactic acid, plasma     Status: None   Collection Time: 05/04/18  4:43 PM  Result Value Ref Range   Lactic Acid, Venous 0.9 0.5 - 1.9 mmol/L    Comment: Performed at [facility], [address] [place], [place] [address]    Imaging / Studies: Ct Abdomen Pelvis W Contrast  Result Date: 05/04/2018 CLINICAL DATA:  Lump in left groin area, evaluate for hernia EXAM: CT ABDOMEN AND PELVIS WITH CONTRAST  TECHNIQUE: Multidetector CT imaging of the abdomen and pelvis was performed using the standard protocol following bolus administration of intravenous contrast. CONTRAST:  174mL OMNIPAQUE IOHEXOL 300 MG/ML  SOLN COMPARISON:  12/21/2016 FINDINGS: Lower chest: Lung bases are clear. Hepatobiliary: Liver is within normal limits. Gallbladder is unremarkable. No intrahepatic or extrahepatic ductal dilatation. Pancreas: Within normal limits. Spleen: Within normal limits. Adrenals/Urinary Tract: Adrenal glands are within normal limits. Kidneys are within normal limits.  No hydronephrosis. Bladder is within normal limits. Stomach/Bowel: Stomach is within normal limits. Prior appendectomy. Diastasis of the left lateral abdominal wall with multiple loops of small bowel (series 2/image 32), chronic. Moderate left femoral hernia containing loops of small bowel (series 2/image 35), previously fat containing. Dilated small bowel entering the hernia (series 2/image 62) and decompressed loops of distal small bowel exiting the hernia (series 2/image 64), indicating that the hernia is obstructive. Colon is relatively decompressed. Sigmoid diverticulosis, without evidence of diverticulitis. Vascular/Lymphatic: No evidence of abdominal aortic aneurysm. Atherosclerotic calcifications of the abdominal aorta and branch vessels. No suspicious abdominopelvic lymphadenopathy. Reproductive: Uterus is notable for a dominant 4.0 cm subserosal fibroid in the posterior uterine fundus. No adnexal masses. Other: No abdominopelvic ascites. Musculoskeletal: Degenerative changes of the visualized thoracolumbar spine. IMPRESSION: Moderate left femoral hernia containing loops of small bowel, new/progressive, previously fat containing. At least some degree of small bowel obstruction is present on the basis of the hernia. Correlate for reducibility/incarceration. Surgical consultation is suggested. Additional stable ancillary findings as above.  Electronically Signed   By: Julian Hy M.D.   On: 05/04/2018 17:32    Medications / Allergies: per chart  Antibiotics: Anti-infectives (From admission, onward)   None        Note: Portions of this report may have been transcribed using voice recognition software. Every effort was made to ensure accuracy; however, inadvertent computerized transcription errors may be present.   Any transcriptional errors that result from this process are unintentional.    Adin Hector, MD, FACS, MASCRS Gastrointestinal and Minimally Invasive Surgery    [address] [place], [place] [address] [phone] Main / Paging [phone] Fax   05/04/2018

## 2018-05-04 NOTE — ED Provider Notes (Signed)
Patient transferred from [facility] ER for general surgery to manage left inguinal hernia which was present on abdominal pelvis CT scan obtained today.  Patient developed acute onset of left lower abdominal pain earlier today when she attempted to have a bowel movement.  Provider did attempt to reduce the hernia without success.  Pt transfer here for further care.   9:11 PM General Surgeon Dr. Johney Maine has seen and evaluated pt and will take pt to OR for further management of her incarcerated hernia.  BP 139/71 (BP Location: Left Arm)   Pulse 75   Temp 97.6 F (36.4 C) (Oral)   Resp 16   Ht 5\' 1"  (1.549 m)   Wt 67.6 kg   SpO2 98%   BMI 28.15 kg/m   Results for orders placed or performed during the hospital encounter of 05/04/18  CBC with Differential  Result Value Ref Range   WBC 10.0 4.0 - 10.5 K/uL   RBC 4.66 3.87 - 5.11 MIL/uL   Hemoglobin 13.8 12.0 - 15.0 g/dL   HCT 42.7 36.0 - 46.0 %   MCV 91.6 80.0 - 100.0 fL   MCH 29.6 26.0 - 34.0 pg   MCHC 32.3 30.0 - 36.0 g/dL   RDW 13.2 11.5 - 15.5 %   Platelets 276 150 - 400 K/uL   nRBC 0.0 0.0 - 0.2 %   Neutrophils Relative % 91 %   Neutro Abs 9.1 (H) 1.7 - 7.7 K/uL   Lymphocytes Relative 6 %   Lymphs Abs 0.6 (L) 0.7 - 4.0 K/uL   Monocytes Relative 2 %   Monocytes Absolute 0.2 0.1 - 1.0 K/uL   Eosinophils Relative 0 %   Eosinophils Absolute 0.0 0.0 - 0.5 K/uL   Basophils Relative 1 %   Basophils Absolute 0.1 0.0 - 0.1 K/uL   Immature Granulocytes 0 %   Abs Immature Granulocytes 0.03 0.00 - 0.07 K/uL  Comprehensive metabolic panel  Result Value Ref Range   Sodium 136 135 - 145 mmol/L   Potassium 4.3 3.5 - 5.1 mmol/L   Chloride 104 98 - 111 mmol/L   CO2 23 22 - 32 mmol/L   Glucose, Bld 133 (H) 70 - 99 mg/dL   BUN 28 (H) 8 - 23 mg/dL   Creatinine, Ser 0.49 0.44 - 1.00 mg/dL   Calcium 9.4 8.9 - 10.3 mg/dL   Total Protein 7.3 6.5 - 8.1 g/dL   Albumin 4.0 3.5 - 5.0 g/dL   AST 28 15 - 41 U/L   ALT 16 0 - 44 U/L    Alkaline Phosphatase 81 38 - 126 U/L   Total Bilirubin 0.8 0.3 - 1.2 mg/dL   GFR calc non Af Amer >60 >60 mL/min   GFR calc Af Amer >60 >60 mL/min   Anion gap 9 5 - 15  Lactic acid, plasma  Result Value Ref Range   Lactic Acid, Venous 0.9 0.5 - 1.9 mmol/L   Ct Abdomen Pelvis W Contrast  Result Date: 05/04/2018 CLINICAL DATA:  Lump in left groin area, evaluate for hernia EXAM: CT ABDOMEN AND PELVIS WITH CONTRAST TECHNIQUE: Multidetector CT imaging of the abdomen and pelvis was performed using the standard protocol following bolus administration of intravenous contrast. CONTRAST:  155mL OMNIPAQUE IOHEXOL 300 MG/ML  SOLN COMPARISON:  12/21/2016 FINDINGS: Lower chest: Lung bases are clear. Hepatobiliary: Liver is within normal limits. Gallbladder is unremarkable. No intrahepatic or extrahepatic ductal dilatation. Pancreas: Within normal limits. Spleen: Within normal limits. Adrenals/Urinary Tract: Adrenal glands  are within normal limits. Kidneys are within normal limits.  No hydronephrosis. Bladder is within normal limits. Stomach/Bowel: Stomach is within normal limits. Prior appendectomy. Diastasis of the left lateral abdominal wall with multiple loops of small bowel (series 2/image 32), chronic. Moderate left femoral hernia containing loops of small bowel (series 2/image 35), previously fat containing. Dilated small bowel entering the hernia (series 2/image 62) and decompressed loops of distal small bowel exiting the hernia (series 2/image 64), indicating that the hernia is obstructive. Colon is relatively decompressed. Sigmoid diverticulosis, without evidence of diverticulitis. Vascular/Lymphatic: No evidence of abdominal aortic aneurysm. Atherosclerotic calcifications of the abdominal aorta and branch vessels. No suspicious abdominopelvic lymphadenopathy. Reproductive: Uterus is notable for a dominant 4.0 cm subserosal fibroid in the posterior uterine fundus. No adnexal masses. Other: No abdominopelvic  ascites. Musculoskeletal: Degenerative changes of the visualized thoracolumbar spine. IMPRESSION: Moderate left femoral hernia containing loops of small bowel, new/progressive, previously fat containing. At least some degree of small bowel obstruction is present on the basis of the hernia. Correlate for reducibility/incarceration. Surgical consultation is suggested. Additional stable ancillary findings as above. Electronically Signed   By: Julian Hy M.D.   On: 05/04/2018 17:32       Domenic Moras, PA-C 05/04/18 2112    Julianne Rice, MD 05/05/18 [phone]

## 2018-05-05 ENCOUNTER — Encounter (HOSPITAL_COMMUNITY): Payer: Self-pay | Admitting: Surgery

## 2018-05-05 DIAGNOSIS — Z791 Long term (current) use of non-steroidal anti-inflammatories (NSAID): Secondary | ICD-10-CM | POA: Diagnosis not present

## 2018-05-05 DIAGNOSIS — M4800 Spinal stenosis, site unspecified: Secondary | ICD-10-CM | POA: Diagnosis not present

## 2018-05-05 DIAGNOSIS — Z9181 History of falling: Secondary | ICD-10-CM | POA: Diagnosis not present

## 2018-05-05 DIAGNOSIS — K4130 Unilateral femoral hernia, with obstruction, without gangrene, not specified as recurrent: Secondary | ICD-10-CM | POA: Diagnosis not present

## 2018-05-05 DIAGNOSIS — R1032 Left lower quadrant pain: Secondary | ICD-10-CM | POA: Diagnosis not present

## 2018-05-05 DIAGNOSIS — Z800 Family history of malignant neoplasm of digestive organs: Secondary | ICD-10-CM | POA: Diagnosis not present

## 2018-05-05 DIAGNOSIS — Z8349 Family history of other endocrine, nutritional and metabolic diseases: Secondary | ICD-10-CM | POA: Diagnosis not present

## 2018-05-05 DIAGNOSIS — G629 Polyneuropathy, unspecified: Secondary | ICD-10-CM | POA: Diagnosis not present

## 2018-05-05 DIAGNOSIS — Z7951 Long term (current) use of inhaled steroids: Secondary | ICD-10-CM | POA: Diagnosis not present

## 2018-05-05 DIAGNOSIS — Z96652 Presence of left artificial knee joint: Secondary | ICD-10-CM | POA: Diagnosis present

## 2018-05-05 DIAGNOSIS — Z825 Family history of asthma and other chronic lower respiratory diseases: Secondary | ICD-10-CM | POA: Diagnosis not present

## 2018-05-05 DIAGNOSIS — M8580 Other specified disorders of bone density and structure, unspecified site: Secondary | ICD-10-CM | POA: Diagnosis not present

## 2018-05-05 DIAGNOSIS — Z7982 Long term (current) use of aspirin: Secondary | ICD-10-CM | POA: Diagnosis not present

## 2018-05-05 DIAGNOSIS — Z9049 Acquired absence of other specified parts of digestive tract: Secondary | ICD-10-CM | POA: Diagnosis not present

## 2018-05-05 DIAGNOSIS — Z8249 Family history of ischemic heart disease and other diseases of the circulatory system: Secondary | ICD-10-CM | POA: Diagnosis not present

## 2018-05-05 DIAGNOSIS — M419 Scoliosis, unspecified: Secondary | ICD-10-CM | POA: Diagnosis not present

## 2018-05-05 DIAGNOSIS — R2681 Unsteadiness on feet: Secondary | ICD-10-CM | POA: Diagnosis not present

## 2018-05-05 DIAGNOSIS — R0689 Other abnormalities of breathing: Secondary | ICD-10-CM | POA: Diagnosis not present

## 2018-05-05 DIAGNOSIS — H9190 Unspecified hearing loss, unspecified ear: Secondary | ICD-10-CM | POA: Diagnosis present

## 2018-05-05 DIAGNOSIS — Z833 Family history of diabetes mellitus: Secondary | ICD-10-CM | POA: Diagnosis not present

## 2018-05-05 DIAGNOSIS — D259 Leiomyoma of uterus, unspecified: Secondary | ICD-10-CM | POA: Diagnosis not present

## 2018-05-05 DIAGNOSIS — K55029 Acute infarction of small intestine, extent unspecified: Secondary | ICD-10-CM | POA: Diagnosis not present

## 2018-05-05 DIAGNOSIS — E785 Hyperlipidemia, unspecified: Secondary | ICD-10-CM | POA: Diagnosis not present

## 2018-05-05 DIAGNOSIS — R739 Hyperglycemia, unspecified: Secondary | ICD-10-CM | POA: Diagnosis not present

## 2018-05-05 DIAGNOSIS — J449 Chronic obstructive pulmonary disease, unspecified: Secondary | ICD-10-CM | POA: Diagnosis not present

## 2018-05-05 MED ORDER — FLUTICASONE PROPIONATE 50 MCG/ACT NA SUSP
2.0000 | Freq: Every day | NASAL | Status: DC
  Administered 2018-05-05 – 2018-05-07 (×3): 2 via NASAL
  Filled 2018-05-05: qty 16

## 2018-05-05 MED ORDER — METRONIDAZOLE IN NACL 5-0.79 MG/ML-% IV SOLN
500.0000 mg | Freq: Four times a day (QID) | INTRAVENOUS | Status: AC
  Administered 2018-05-05 (×4): 500 mg via INTRAVENOUS
  Filled 2018-05-05 (×4): qty 100

## 2018-05-05 MED ORDER — SODIUM CHLORIDE 0.9 % IV SOLN
INTRAVENOUS | Status: DC | PRN
  Administered 2018-05-05: 250 mL via INTRAVENOUS
  Administered 2018-05-05 – 2018-05-07 (×2): 1000 mL via INTRAVENOUS

## 2018-05-05 MED ORDER — CEFAZOLIN SODIUM-DEXTROSE 2-4 GM/100ML-% IV SOLN
2.0000 g | Freq: Three times a day (TID) | INTRAVENOUS | Status: DC
  Administered 2018-05-05 – 2018-05-07 (×8): 2 g via INTRAVENOUS
  Filled 2018-05-05 (×10): qty 100

## 2018-05-05 MED ORDER — ACETAMINOPHEN 500 MG PO TABS
1000.0000 mg | ORAL_TABLET | Freq: Three times a day (TID) | ORAL | Status: DC
  Administered 2018-05-05 – 2018-05-06 (×6): 1000 mg via ORAL
  Filled 2018-05-05 (×7): qty 2

## 2018-05-05 MED ORDER — MELOXICAM 7.5 MG PO TABS
7.5000 mg | ORAL_TABLET | Freq: Every day | ORAL | Status: DC
  Administered 2018-05-05 – 2018-05-06 (×2): 7.5 mg via ORAL
  Filled 2018-05-05 (×4): qty 1

## 2018-05-05 MED ORDER — GABAPENTIN 300 MG PO CAPS: 300.0000 mg | ORAL_CAPSULE | Freq: Every day | ORAL | Status: DC

## 2018-05-05 MED ORDER — ASPIRIN EC 81 MG PO TBEC
81.0000 mg | DELAYED_RELEASE_TABLET | Freq: Every day | ORAL | Status: DC
  Administered 2018-05-05 – 2018-05-08 (×4): 81 mg via ORAL
  Filled 2018-05-05 (×4): qty 1

## 2018-05-05 MED ORDER — LIP MEDEX EX OINT
TOPICAL_OINTMENT | CUTANEOUS | Status: AC
  Filled 2018-05-05: qty 7

## 2018-05-05 MED ORDER — GABAPENTIN 100 MG PO CAPS
100.0000 mg | ORAL_CAPSULE | Freq: Every day | ORAL | Status: AC
  Administered 2018-05-05 – 2018-05-06 (×2): 100 mg via ORAL
  Filled 2018-05-05 (×2): qty 1

## 2018-05-05 MED ORDER — CALCIUM CARBONATE-VITAMIN D 500-200 MG-UNIT PO TABS
2.0000 | ORAL_TABLET | Freq: Every day | ORAL | Status: DC
  Administered 2018-05-05 – 2018-05-08 (×4): 2 via ORAL
  Filled 2018-05-05 (×4): qty 2

## 2018-05-05 MED ORDER — SODIUM CHLORIDE 0.9 % IV SOLN
INTRAVENOUS | Status: DC | PRN
  Administered 2018-05-04: 1000 mL

## 2018-05-05 MED ORDER — MOMETASONE FURO-FORMOTEROL FUM 200-5 MCG/ACT IN AERO
2.0000 | INHALATION_SPRAY | Freq: Two times a day (BID) | RESPIRATORY_TRACT | Status: DC
  Administered 2018-05-05 – 2018-05-08 (×6): 2 via RESPIRATORY_TRACT
  Filled 2018-05-05: qty 8.8

## 2018-05-05 MED ORDER — PROCHLORPERAZINE EDISYLATE 10 MG/2ML IJ SOLN: 5.0000 mg | INTRAMUSCULAR | Status: DC | PRN

## 2018-05-05 MED ORDER — ADULT MULTIVITAMIN W/MINERALS CH
1.0000 | ORAL_TABLET | Freq: Every day | ORAL | Status: DC
  Administered 2018-05-05 – 2018-05-08 (×4): 1 via ORAL
  Filled 2018-05-05 (×4): qty 1

## 2018-05-05 NOTE — Progress Notes (Signed)
1 Day Post-Op  Subjective: Tanya Harris is a 85 yo female who underwent laparoscopic left femoral hernia repair and small bowel resection yesterday, 3/1. Pain is controlled with Tylenol. She has not had a bowel movement and does not have any flatulence. Denies nausea, vomiting. She reported drinking some juice this morning and having no increase in pain or nausea afterwards. She has not ambulated since surgery.   Objective: Vital signs in last 24 hours: Temp:  [97.3 F (36.3 C)-97.6 F (36.4 C)] 97.5 F (36.4 C) (03/02 0515) Pulse Rate:  [70-92] 70 (03/02 0515) Resp:  [12-20] 16 (03/02 0515) BP: (117-157)/(49-72) 134/49 (03/02 0515) SpO2:  [96 %-100 %] 96 % (03/02 0515) Weight:  [67.6 kg] 67.6 kg (03/01 1528)    Intake/Output from previous day: 03/01 0701 - 03/02 0700 In: 3225.4 [I.V.:2520; IV Piggyback:705.4] Out: 200 [Urine:100; Blood:100] Intake/Output this shift: Total I/O In: 120 [P.O.:120] Out: -   PE: General: Patient seems to be resting comfortable in no apparent distress.  Cardiovascular: Regular rate and rhythm. No murmur, rub, gallop. UE and LE pulses 2+ equal bilaterally. Respiratory: CTA, no wheezing or crackles. Gastrointestinal: Decreased bowel sounds. TTP bilateral LQ. No rebound tenderness, guarding, rigidity.  Extremities: Ecchymosis noted on left proximal thigh. No edema distally. No calf pain.  Integumentary: Blood on honeycomb dressing. No pus or drainage.  Psych: Alert and oriented x3.  Lab Results:  Recent Labs    05/04/18 1600  WBC 10.0  HGB 13.8  HCT 42.7  PLT 276   BMET Recent Labs    05/04/18 1600  NA 136  K 4.3  CL 104  CO2 23  GLUCOSE 133*  BUN 28*  CREATININE 0.49  CALCIUM 9.4   PT/INR No results for input(s): LABPROT, INR in the last 72 hours. CMP     Component Value Date/Time   NA 136 05/04/2018 1600   K 4.3 05/04/2018 1600   CL 104 05/04/2018 1600   CO2 23 05/04/2018 1600   GLUCOSE 133 (H) 05/04/2018 1600   BUN 28  (H) 05/04/2018 1600   CREATININE 0.49 05/04/2018 1600   CALCIUM 9.4 05/04/2018 1600   PROT 7.3 05/04/2018 1600   ALBUMIN 4.0 05/04/2018 1600   AST 28 05/04/2018 1600   ALT 16 05/04/2018 1600   ALKPHOS 81 05/04/2018 1600   BILITOT 0.8 05/04/2018 1600   GFRNONAA >60 05/04/2018 1600   GFRAA >60 05/04/2018 1600   Lipase  No results found for: LIPASE     Studies/Results: Ct Abdomen Pelvis W Contrast  Result Date: 05/04/2018 CLINICAL DATA:  Lump in left groin area, evaluate for hernia EXAM: CT ABDOMEN AND PELVIS WITH CONTRAST TECHNIQUE: Multidetector CT imaging of the abdomen and pelvis was performed using the standard protocol following bolus administration of intravenous contrast. CONTRAST:  154mL OMNIPAQUE IOHEXOL 300 MG/ML  SOLN COMPARISON:  12/21/2016 FINDINGS: Lower chest: Lung bases are clear. Hepatobiliary: Liver is within normal limits. Gallbladder is unremarkable. No intrahepatic or extrahepatic ductal dilatation. Pancreas: Within normal limits. Spleen: Within normal limits. Adrenals/Urinary Tract: Adrenal glands are within normal limits. Kidneys are within normal limits.  No hydronephrosis. Bladder is within normal limits. Stomach/Bowel: Stomach is within normal limits. Prior appendectomy. Diastasis of the left lateral abdominal wall with multiple loops of small bowel (series 2/image 32), chronic. Moderate left femoral hernia containing loops of small bowel (series 2/image 35), previously fat containing. Dilated small bowel entering the hernia (series 2/image 62) and decompressed loops of distal small bowel exiting the  hernia (series 2/image 64), indicating that the hernia is obstructive. Colon is relatively decompressed. Sigmoid diverticulosis, without evidence of diverticulitis. Vascular/Lymphatic: No evidence of abdominal aortic aneurysm. Atherosclerotic calcifications of the abdominal aorta and branch vessels. No suspicious abdominopelvic lymphadenopathy. Reproductive: Uterus is notable  for a dominant 4.0 cm subserosal fibroid in the posterior uterine fundus. No adnexal masses. Other: No abdominopelvic ascites. Musculoskeletal: Degenerative changes of the visualized thoracolumbar spine. IMPRESSION: Moderate left femoral hernia containing loops of small bowel, new/progressive, previously fat containing. At least some degree of small bowel obstruction is present on the basis of the hernia. Correlate for reducibility/incarceration. Surgical consultation is suggested. Additional stable ancillary findings as above. Electronically Signed   By: Julian Hy M.D.   On: 05/04/2018 17:32    Anti-infectives: Anti-infectives (From admission, onward)   Start     Dose/Rate Route Frequency Ordered Stop   05/05/18 0600  clindamycin (CLEOCIN) IVPB 900 mg     900 mg 100 mL/hr over 30 Minutes Intravenous On call to O.R. 05/04/18 2042 05/06/18 0559   05/05/18 0600  gentamicin (GARAMYCIN) 340 mg in dextrose 5 % 100 mL IVPB     5 mg/kg  67.6 kg 108.5 mL/hr over 60 Minutes Intravenous On call to O.R. 05/04/18 2042 05/04/18 2229   05/05/18 0130  ceFAZolin (ANCEF) IVPB 2g/100 mL premix     2 g 200 mL/hr over 30 Minutes Intravenous Every 8 hours 05/05/18 0121 05/08/18 2159   05/05/18 0130  metroNIDAZOLE (FLAGYL) IVPB 500 mg     500 mg 100 mL/hr over 60 Minutes Intravenous Every 6 hours 05/05/18 0121 05/05/18 2359   05/04/18 2310  clindamycin (CLEOCIN) 900 mg, gentamicin (GARAMYCIN) 240 mg in sodium chloride 0.9 % 1,000 mL for intraperitoneal lavage  Status:  Discontinued       As needed 05/05/18 0524 05/05/18 0524   05/04/18 2230  clindamycin (CLEOCIN) 900 mg, gentamicin (GARAMYCIN) 240 mg in sodium chloride 0.9 % 1,000 mL for intraperitoneal lavage  Status:  Discontinued    Note to Pharmacy:  Have in the  [facility] room for final irrigation in bowel surgery case to minimize risk of abscess/infection Pharmacy may adjust dosing strength, schedule, rate of infusion, etc as needed to optimize therapy     Intraperitoneal To Surgery 05/04/18 2226 05/05/18 0115   05/04/18 2124  clindamycin (CLEOCIN) 900 MG/50ML IVPB    Note to Pharmacy:  Elisha Headland  : cabinet override      05/04/18 2124 05/05/18 0929       Assessment/Plan Post-op laparoscopic left femoral hernia repair and small bowel resection for strangulated left femoral hernia and SBO Continue clear liquid diet. Ice left proximal thigh for 20 min intervals throughout the day to decrease ecchymosis. See PT/OT to start ambulating today.   FEN - IVF VTE - Aspirin ID - Cleocin, Ancef, Flagyl   LOS: 0 days    Deforest Hoyles , PA-S [facility] 05/05/2018, 8:56 AM Pager: [phone]

## 2018-05-05 NOTE — Discharge Instructions (Signed)
HERNIA REPAIR: POST OP INSTRUCTIONS ° °###################################################################### ° °EAT °Gradually transition to a high fiber diet with a fiber supplement over the next few weeks after discharge.  Start with a pureed / full liquid diet (see below) ° °WALK °Walk an hour a day.  Control your pain to do that.   ° °CONTROL PAIN °Control pain so that you can walk, sleep, tolerate sneezing/coughing, and go up/down stairs. ° °HAVE A BOWEL MOVEMENT DAILY °Keep your bowels regular to avoid problems.  OK to try a laxative to override constipation.  OK to use an antidairrheal to slow down diarrhea.  Call if not better after 2 tries ° °CALL IF YOU HAVE PROBLEMS/CONCERNS °Call if you are still struggling despite following these instructions. °Call if you have concerns not answered by these instructions ° °###################################################################### ° ° ° °1. DIET: Follow a light bland diet the first 24 hours after arrival home, such as soup, liquids, crackers, etc.  Be sure to include lots of fluids daily.  Advance to a low fat / high fiber diet over the next few days after surgery.  Avoid fast food or heavy meals the first week as your are more likely to get nauseated.   ° °2. Take your usually prescribed home medications unless otherwise directed. ° °3. PAIN CONTROL: °a. Pain is best controlled by a usual combination of three different methods TOGETHER: °i. Ice/Heat °ii. Over the counter pain medication °iii. Prescription pain medication °b. Most patients will experience some swelling and bruising around the hernia(s) such as the bellybutton, groins, or old incisions.  Ice packs or heating pads (30-60 minutes up to 6 times a day) will help. Use ice for the first few days to help decrease swelling and bruising, then switch to heat to help relax tight/sore spots and speed recovery.  Some people prefer to use ice alone, heat alone, alternating between ice & heat.  Experiment  to what works for you.  Swelling and bruising can take several weeks to resolve.   °c. It is helpful to take an over-the-counter pain medication regularly for the first few weeks.  Choose one of the following that works best for you: °i. Naproxen (Aleve, etc)  Two 220mg tabs twice a day °ii. Ibuprofen (Advil, etc) Three 200mg tabs four times a day (every meal & bedtime) °iii. Acetaminophen (Tylenol, etc) 325-650mg four times a day (every meal & bedtime) °d. A  prescription for pain medication should be given to you upon discharge.  Take your pain medication as prescribed.  °i. If you are having problems/concerns with the prescription medicine (does not control pain, nausea, vomiting, rash, itching, etc), please call us [phone] to see if we need to switch you to a different pain medicine that will work better for you and/or control your side effect better. °ii. If you need a refill on your pain medication, please contact your pharmacy.  They will contact our office to request authorization. Prescriptions will not be filled after 5 pm or on week-ends. ° °4. Avoid getting constipated.  Between the surgery and the pain medications, it is common to experience some constipation.  Increasing fluid intake and taking a fiber supplement (such as Metamucil, Citrucel, FiberCon, MiraLax, etc) 1-2 times a day regularly will usually help prevent this problem from occurring.  A mild laxative (prune juice, Milk of Magnesia, MiraLax, etc) should be taken according to package directions if there are no bowel movements after 48 hours.   ° °5. Wash / shower every   day.  You may shower over the dressings as they are waterproof.   ° °6. Remove your waterproof bandages, skin tapes, and other bandages 5 days after surgery. You may replace a dressing/Band-Aid to cover the incision for comfort if you wish. You may leave the incisions open to air.  You may replace a dressing/Band-Aid to cover an incision for comfort if you wish.   Continue to shower over incision(s) after the dressing is off. ° °7. ACTIVITIES as tolerated:   °a. You may resume regular (light) daily activities beginning the next day--such as daily self-care, walking, climbing stairs--gradually increasing activities as tolerated.  Control your pain so that you can walk an hour a day.  If you can walk 30 minutes without difficulty, it is safe to try more intense activity such as jogging, treadmill, bicycling, low-impact aerobics, swimming, etc. °b. Save the most intensive and strenuous activity for last such as sit-ups, heavy lifting, contact sports, etc  Refrain from any heavy lifting or straining until you are off narcotics for pain control.   °c. DO NOT PUSH THROUGH PAIN.  Let pain be your guide: If it hurts to do something, don't do it.  Pain is your body warning you to avoid that activity for another week until the pain goes down. °d. You may drive when you are no longer taking prescription pain medication, you can comfortably wear a seatbelt, and you can safely maneuver your car and apply brakes. °e. You may have sexual intercourse when it is comfortable.  ° °8. FOLLOW UP in our office °a. Please call [facility] at [phone] to set up an appointment to see your surgeon in the office for a follow-up appointment approximately 2-3 weeks after your surgery. °b. Make sure that you call for this appointment the day you arrive home to insure a convenient appointment time. ° °9.  If you have disability of FMLA / Family leave forms, please bring the forms to the office for processing.  (do not give to your surgeon). ° °WHEN TO CALL US [phone]: °1. Poor pain control °2. Reactions / problems with new medications (rash/itching, nausea, etc)  °3. Fever over 101.5 F (38.5 C) °4. Inability to urinate °5. Nausea and/or vomiting °6. Worsening swelling or bruising °7. Continued bleeding from incision. °8. Increased pain, redness, or drainage from the incision ° ° The clinic staff is  available to answer your questions during regular business hours (8:30am-5pm).  Please don’t hesitate to call and ask to speak to one of our nurses for clinical concerns.  ° If you have a medical emergency, go to the nearest emergency room or call 911. ° A surgeon from [facility] is always on call at the hospitals in [place] ° °[facility] °[address], [address], [place], [place]  [address] ? ° [address] [address], [place], [place]   [address] °MAIN: [phone] ? TOLL FREE: [phone] ? FAX: [phone] °[email or web address] ° °

## 2018-05-05 NOTE — Op Note (Signed)
05/04/2018 - 05/05/2018  12:12 AM  PATIENT:  Tanya Harris  85 y.o. female  Patient Care Team: Copland, Gwenlyn Found, MD as PCP - General (Family Medicine) Delena Serve, MD as Referring Physician (Orthopedic Surgery) Goodhart Chimes, MD as Consulting Physician (Ophthalmology) Karie Soda, MD as Consulting Physician (General Surgery)  PRE-OPERATIVE DIAGNOSIS:  Incarcerated femoral vs inguinal hernia with small bowel obstruction  POST-OPERATIVE DIAGNOSIS:  Strangulated left femoral hernia with small bowel obstruction  PROCEDURE:    LAPAROSCOPIC LEFT FEMORAL HERNIA REPAIR LAPAROSCOPIC SMALL BOWEL RESECTION LEFT TAP BLOCK LAPAROSCOPY DIAGNOSTIC  SURGEON:  Ardeth Sportsman, MD  ASSISTANT: RN   ANESTHESIA:    General  Ilioinguinal, genitofemoral, and spermatic cord nerve blocks  Nerve block provided with liposomal bupivacaine (Experel) mixed with 0.25% bupivacaine as a Left TAP block x 30mL t the level of the transverse abdominis & preperitoneal spaces along the flank at the anterior axillary line, from subcostal ridge to iliac crest under laparoscopic guidance   EBL:  Total I/O In: 2000 [I.V.:2000] Out: 200 [Urine:100; Blood:100]  Delay start of Pharmacological VTE agent (>24hrs) due to surgical blood loss or risk of bleeding:  NO  DRAINS: none   SPECIMEN:   Distal ileum x15 cm Left femoral hernia sac   DISPOSITION OF SPECIMEN:  N/A  COUNTS:  YES  PLAN OF CARE: Discharge to home after PACU  PATIENT DISPOSITION:  PACU - hemodynamically stable.  INDICATION: Obese woman with scoliosis with severe pain and left groin mass.  CT scan suspicious for femoral versus inguinal hernia causing small bowel obstruction.  Not reducible with pain and some early cellulitis.  Recommended laparoscopic exploration.  Repair of hernias found.  Possible need for small bowel resection.  Possible conversion open.  The anatomy & physiology of the abdominal wall and pelvic floor was discussed.   The pathophysiology of hernias in the inguinal and pelvic region was discussed.  Natural history risks such as progressive enlargement, pain, incarceration & strangulation was discussed.   Contributors to complications such as smoking, obesity, diabetes, prior surgery, etc were discussed.    I feel the risks of no intervention will lead to serious problems that outweigh the operative risks; therefore, I recommended surgery to reduce and repair the hernia.  I explained laparoscopic techniques with possible need for an open approach.  I noted usual use of mesh to patch and/or buttress hernia repair  Risks such as bleeding, infection, abscess, need for further treatment, heart attack, death, and other risks were discussed.  I noted a good likelihood this will help address the problem.   Goals of post-operative recovery were discussed as well.  Possibility that this will not correct all symptoms was explained.  I stressed the importance of low-impact activity, aggressive pain control, avoiding constipation, & not pushing through pain to minimize risk of post-operative chronic pain or injury. Possibility of reherniation was discussed.  We will work to minimize complications.     An educational handout further explaining the pathology & treatment options was given as well.  Questions were answered.  The patient expresses understanding & wishes to proceed with surgery.  OR FINDINGS: Large left femoral hernia with distal ileum incarcerated in it with complete necrosis x15 cm.  Resection and side-to-side anastomosis done.  Transabdominal preperitoneal (TAPP) hernia repair done with 20x15cm  absorbable mesh  Mesh was used: PhasixT Mesh (a knitted monofilament mesh scaffold using Poly-4-hydroxybutyrate (P4HB), a biologically derived, fully resorbable material)  DESCRIPTION:   Informed consent was confirmed.  The  patient underwent general anaesthesia without difficulty.  With the patient asleep I was able to  finally reduce the left groin hernia.  The patient was positioned appropriately.  VTE prevention in place.  The patient's abdomen was clipped, prepped, & draped in a sterile fashion.  Surgical timeout confirmed our plan.  Peritoneal entry with a laparoscopic port was obtained using optical entry technique in the right upper abdomen as the patient was positioned in reverse Trendelenburg.  Entry was clean.  I induced carbon dioxide insufflation.  Camera inspection revealed no injury.  Extra ports were carefully placed under direct laparoscopic visualization.  Patient had a very contorted abdominal wall with significant stretching and eventration of the left flank but no true hernia there.  However I could see a loop of dark purple necrotic small bowel with clear transition points consistent with the area of incarceration.  Bowel was necrotic = strangulated hernia.  Felt that this would require bowel resection.    I started with a TAPP approach to help get the peritoneum down and reduce the hernia sac out.  Freed the peritoneum off the anterior abdominal wall with a long transverse incision going from the right flank to the left flank.  I used blunt & focused sharp dissection to free the peritoneum off the flank and down to the pubic rim.  I focused on the LEFT side where the hernia was detected preoperatively.  I freed the anteriolateral bladder wall off the anteriolateral pelvic wall on that side, sparing midline attachments.  Was able to reduce a large thickened hernia sac out of the femoral canal.  No definite direct space nor indirect inguinal hernias.  No obturator hernia.  I freed the peritoneum off its attachment to the round ligament.  Freed off the retroperitoneum.  Ended up transecting very lateral & twisted arcuate ligament above the left iliac crest to not been adequate preperitoneal space pocket.  Hemostasis was ensured.  I did irrigation with antibiotic solution (clindamycin/gentamicin x1 L)  peritoneal lavage.  I left that in place.  I created a Pfannenstiel incision in the suprapubic region 2 fingerbreadths above the pubic rim.  Came through the subcutaneous tissues and anterior rectus fascia transversely.  I freed the fascia off the linea alba.  I came through the rectus muscles and encountered the preperitoneal pocket.  I placed a wound protector.  Was able to bring up the small intestine confirmed the purple necrotic ileum with sharp demarcation consistent with the bowel that had been strangulated in the left femoral hernia.  I performed an extracorporeal resection anastomosis.  Resected using a 75 GIA stapler to form a side-to-side stapled anastomosis.  I did inspection evacuated some old clot out of the ileal lumen..  Mucosa viable at the anastomosis.  We then used a TX 90 to transect across the distal end staple anastomosis and removing the common channel a Barcelona type two staple technique.  Transected the mesentery with clamps and silk ties.  I closed the mesenteric defect transversely using the omentum to patch over the TX staple line to good result.  Placed a antimesenteric tension-free crotch stitch as well.  Ran the small bowel & confirmed that the anastomosis was within 15 cm of the ileocecal region.  Ran the small bowel bowel proximally was seen no other abnormalities above the small intestine return in the abdomen.  I brought up the peritoneum and closed some peritoneal defects with 2-0 Vicryl suture in a running fashion.  Then aspirated the antibiotic irrigation.  Hemostasis was good.  Because it was a low femoral hernia moderate size, I did not think this could be easily stitched and therefore required mesh patching.  Because there is a necrotic bowel, I do not feel permanent polypropylene mesh could be used.  Therefore I chose Phasix 15x20 cm mesh.  Once tied did have an anti-adhesion coating.  I cut a sigmoid-shaped slit within a side of the mesh, about 7cm from one edge.  I  placed the mesh into the preperitoneal space & laid it as a diamond such that the inferior point had a 6x6 cm corner flap resting in the true pelvis, covering the obturator & femoral foramina.  Had the mesh laying more medially than usual such that it was definitely over the midline for plenty of overlap.  Because of the large hernia, I did use a secure strap tacker to help tack the superomedial corners of the mesh along the suprapubic pelvic brim.  Also a few superolaterally above the iliac crest on the left side.  Then brought the peritoneum up and tacked that up as well laterally.  I reinspected the bowel and saw no other abnormalities of concern.  Eventually carbon oxide.  Her wound protector.  I could see the peritoneum through the Pfannenstiel incision.  Brought that up.  Patient had redundant peritoneum due to the large femoral hernia sac.  I transected that off where was most edematous and thickened.  There is no evidence of any purulence or necrosis.  This allowed a more normal appearing peritoneum.  I closed that primarily transversely and then vertically such that there were no peritoneal defects.  I did laparoscopic reinspection confirmed that all peritoneal defects had been closed and there was no exposed mesh to the intraperitoneal cavity.  Hemostasis good.  Bowels viable.  We evacuated the carbon dioxide out of the peritoneum.  I closed the anterior rectus fascia transversely using #1 PDS.  I closed the skin with Monocryl.  I did leave the corners of the Pfannenstiel incision open and placed antibiotic soaked umbilical tape wicks into the deep subcutaneous tissues.  Sterile dressings applied.  Patient did have some hypercapnia at first but that rapidly improved.  Blood loss not significant.  Hemodynamically stable.  Reasonable to go to the floor.  Anesthesia concurred.  I discussed operative findings, updated the patient's status, discussed probable steps to recovery, and gave postoperative  recommendations to the patient's family.  Recommendations were made.  Questions were answered.  They expressed understanding & appreciation.     Ardeth Sportsman, M.D., F.A.C.S. Gastrointestinal and Minimally Invasive Surgery [facility] [address], [address] [place], [place] [address] [phone] Main / Paging

## 2018-05-05 NOTE — Anesthesia Postprocedure Evaluation (Signed)
Anesthesia Post Note  Patient: [name]  Procedure(s) Performed: LAPAROSCOPY DIAGNOSTIC , INGUINAL HERNIA REPAIR (N/A ) LAPAROSCOPIC SMALL BOWEL RESECTION (N/A )     Patient location during evaluation: PACU Anesthesia Type: General Level of consciousness: awake and alert Pain management: pain level controlled Vital Signs Assessment: post-procedure vital signs reviewed and stable Respiratory status: spontaneous breathing, nonlabored ventilation, respiratory function stable and patient connected to nasal cannula oxygen Cardiovascular status: blood pressure returned to baseline and stable Postop Assessment: no apparent nausea or vomiting Anesthetic complications: no    Last Vitals:  Vitals:   05/04/18 1959 05/05/18 0015  BP: 139/71 (!) 146/69  Pulse: 75 80  Resp: 16 14  Temp:  (!) 36.3 C  SpO2: 98% 100%    Last Pain:  Vitals:   05/05/18 0015  TempSrc:   PainSc: Asleep                 Juniel Groene DAVID

## 2018-05-05 NOTE — Transfer of Care (Signed)
Immediate Anesthesia Transfer of Care Note  Patient: Tanya Harris  Procedure(s) Performed: LAPAROSCOPY DIAGNOSTIC , FEMORAL  HERNIA REPAIR WITH SMALL BOWEL RESECTION WITH TAP BLOCK (N/A )  Patient Location: PACU  Anesthesia Type:General  Level of Consciousness: awake, alert , oriented and patient cooperative  Airway & Oxygen Therapy: Patient Spontanous Breathing and Patient connected to face mask oxygen  Post-op Assessment: Report given to RN, Post -op Vital signs reviewed and stable and Patient moving all extremities X 4  Post vital signs: stable  Last Vitals:  Vitals Value Taken Time  BP    Temp    Pulse 75 05/05/2018 12:30 AM  Resp 18 05/05/2018 12:30 AM  SpO2 100 % 05/05/2018 12:30 AM  Vitals shown include unvalidated device data.  Last Pain:  Vitals:   05/05/18 0015  TempSrc:   PainSc: Asleep         Complications: No apparent anesthesia complications

## 2018-05-06 MED ORDER — SODIUM CHLORIDE 0.9 % IV SOLN: INTRAVENOUS | Status: DC

## 2018-05-06 NOTE — Progress Notes (Addendum)
2 Days Post-Op  Subjective: Tanya Harris is an 85 yo female who is day 2 post-op from a laparoscopic left femoral hernia repair and small bowel resection for a strangulated left femoral hernia with SBO. She reported having more pain this morning due to not yet receiving pain medication. Her pain is very minimal when she is given Tylenol. She is tolerating clear liquid diet well. She denies nausea, vomiting. She has not yet had a bowel movement since surgery but she reported passing gas one time this morning. Her catheter was removed and she has been getting up to urinate with the assistance of her walker. She reported having normal urine output. She ambulated yesterday and denies shortness of breath or increase in pain with this. She has been using a spirometer every hour. No shortness of breath, wheezing, cough. The discoloration on her left proximal thigh seems to be improving and it is not causing her any discomfort.   Objective: Vital signs in last 24 hours: Temp:  [97.4 F (36.3 C)-98.3 F (36.8 C)] 98.3 F (36.8 C) (03/03 0545) Pulse Rate:  [75-87] 83 (03/03 0545) Resp:  [16-18] 16 (03/03 0545) BP: (125-142)/(60-69) 142/62 (03/03 0545) SpO2:  [97 %-98 %] 97 % (03/03 0735)    Intake/Output from previous day: 03/02 0701 - 03/03 0700 In: 364.7 [P.O.:120; I.V.:49.7; IV Piggyback:195.1] Out: 250 [Urine:250] Intake/Output this shift: No intake/output data recorded.  PE: General: Patient seems to be resting comfortable in no apparent pain or distress.  Cardiovascular: Regular rate and rhythm. No murmur, rub, gallop. UE and LE pulses 2+ equal bilaterally. Respiratory: CTA, no wheezing or crackles. Gastrointestinal: Decreased bowel sounds. Mildly TTP bilateral LLQ. No rebound tenderness, guarding, rigidity.  Extremities: Ecchymosis noted on left proximal thigh. No edema distally. No calf pain.  Integumentary: Blood on honeycomb dressing. No pus or drainage.  Psych: Alert and oriented  x3.  Lab Results:  Recent Labs    05/04/18 1600  WBC 10.0  HGB 13.8  HCT 42.7  PLT 276   BMET Recent Labs    05/04/18 1600  NA 136  K 4.3  CL 104  CO2 23  GLUCOSE 133*  BUN 28*  CREATININE 0.49  CALCIUM 9.4   PT/INR No results for input(s): LABPROT, INR in the last 72 hours. CMP     Component Value Date/Time   NA 136 05/04/2018 1600   K 4.3 05/04/2018 1600   CL 104 05/04/2018 1600   CO2 23 05/04/2018 1600   GLUCOSE 133 (H) 05/04/2018 1600   BUN 28 (H) 05/04/2018 1600   CREATININE 0.49 05/04/2018 1600   CALCIUM 9.4 05/04/2018 1600   PROT 7.3 05/04/2018 1600   ALBUMIN 4.0 05/04/2018 1600   AST 28 05/04/2018 1600   ALT 16 05/04/2018 1600   ALKPHOS 81 05/04/2018 1600   BILITOT 0.8 05/04/2018 1600   GFRNONAA >60 05/04/2018 1600   GFRAA >60 05/04/2018 1600   Lipase  No results found for: LIPASE     Studies/Results: Ct Abdomen Pelvis W Contrast  Result Date: 05/04/2018 CLINICAL DATA:  Lump in left groin area, evaluate for hernia EXAM: CT ABDOMEN AND PELVIS WITH CONTRAST TECHNIQUE: Multidetector CT imaging of the abdomen and pelvis was performed using the standard protocol following bolus administration of intravenous contrast. CONTRAST:  141mL OMNIPAQUE IOHEXOL 300 MG/ML  SOLN COMPARISON:  12/21/2016 FINDINGS: Lower chest: Lung bases are clear. Hepatobiliary: Liver is within normal limits. Gallbladder is unremarkable. No intrahepatic or extrahepatic ductal dilatation. Pancreas: Within normal  limits. Spleen: Within normal limits. Adrenals/Urinary Tract: Adrenal glands are within normal limits. Kidneys are within normal limits.  No hydronephrosis. Bladder is within normal limits. Stomach/Bowel: Stomach is within normal limits. Prior appendectomy. Diastasis of the left lateral abdominal wall with multiple loops of small bowel (series 2/image 32), chronic. Moderate left femoral hernia containing loops of small bowel (series 2/image 35), previously fat containing. Dilated  small bowel entering the hernia (series 2/image 62) and decompressed loops of distal small bowel exiting the hernia (series 2/image 64), indicating that the hernia is obstructive. Colon is relatively decompressed. Sigmoid diverticulosis, without evidence of diverticulitis. Vascular/Lymphatic: No evidence of abdominal aortic aneurysm. Atherosclerotic calcifications of the abdominal aorta and branch vessels. No suspicious abdominopelvic lymphadenopathy. Reproductive: Uterus is notable for a dominant 4.0 cm subserosal fibroid in the posterior uterine fundus. No adnexal masses. Other: No abdominopelvic ascites. Musculoskeletal: Degenerative changes of the visualized thoracolumbar spine. IMPRESSION: Moderate left femoral hernia containing loops of small bowel, new/progressive, previously fat containing. At least some degree of small bowel obstruction is present on the basis of the hernia. Correlate for reducibility/incarceration. Surgical consultation is suggested. Additional stable ancillary findings as above. Electronically Signed   By: Julian Hy M.D.   On: 05/04/2018 17:32    Anti-infectives: Anti-infectives (From admission, onward)   Start     Dose/Rate Route Frequency Ordered Stop   05/05/18 0600  clindamycin (CLEOCIN) IVPB 900 mg     900 mg 100 mL/hr over 30 Minutes Intravenous On call to O.R. 05/04/18 2042 05/06/18 0559   05/05/18 0600  gentamicin (GARAMYCIN) 340 mg in dextrose 5 % 100 mL IVPB     5 mg/kg  67.6 kg 108.5 mL/hr over 60 Minutes Intravenous On call to O.R. 05/04/18 2042 05/04/18 2229   05/05/18 0130  ceFAZolin (ANCEF) IVPB 2g/100 mL premix     2 g 200 mL/hr over 30 Minutes Intravenous Every 8 hours 05/05/18 0121 05/08/18 2159   05/05/18 0130  metroNIDAZOLE (FLAGYL) IVPB 500 mg     500 mg 100 mL/hr over 60 Minutes Intravenous Every 6 hours 05/05/18 0121 05/05/18 2100   05/04/18 2310  clindamycin (CLEOCIN) 900 mg, gentamicin (GARAMYCIN) 240 mg in sodium chloride 0.9 % 1,000 mL  for intraperitoneal lavage  Status:  Discontinued       As needed 05/05/18 0524 05/05/18 0524   05/04/18 2230  clindamycin (CLEOCIN) 900 mg, gentamicin (GARAMYCIN) 240 mg in sodium chloride 0.9 % 1,000 mL for intraperitoneal lavage  Status:  Discontinued    Note to Pharmacy:  Have in the  [place] room for final irrigation in bowel surgery case to minimize risk of abscess/infection Pharmacy may adjust dosing strength, schedule, rate of infusion, etc as needed to optimize therapy    Intraperitoneal To Surgery 05/04/18 2226 05/05/18 0115   05/04/18 2124  clindamycin (CLEOCIN) 900 MG/50ML IVPB    Note to Pharmacy:  Elisha Headland  : cabinet override      05/04/18 2124 05/05/18 0929       Assessment/Plan Post-op laparoscopic left femoral hernia repair and small bowel resection for strangulated left femoral hernia and SBO Continue clear liquid diet. Continue to ice left proximal thigh for 20 min intervals throughout the day to decrease ecchymosis. Continue ambulation throughout the day. Continue to use spirometer.   FEN - IVF VTE - Aspirin, ambulating  ID - Ancef   LOS: 1 day    Deforest Hoyles , PA-S [facility] 05/06/2018, 8:10 AM Pager: [phone]

## 2018-05-07 LAB — BASIC METABOLIC PANEL
Anion gap: 5 (ref 5–15)
BUN: 14 mg/dL (ref 8–23)
CO2: 25 mmol/L (ref 22–32)
Calcium: 8.2 mg/dL — ABNORMAL LOW (ref 8.9–10.3)
Chloride: 103 mmol/L (ref 98–111)
Creatinine, Ser: 0.34 mg/dL — ABNORMAL LOW (ref 0.44–1.00)
GFR calc non Af Amer: >60 mL/min (ref >60)
Glucose, Bld: 112 mg/dL — ABNORMAL HIGH (ref 70–99)
Potassium: 3.4 mmol/L — ABNORMAL LOW (ref 3.5–5.1)
Sodium: 133 mmol/L — ABNORMAL LOW (ref 135–145)

## 2018-05-07 MED ORDER — OXYCODONE HCL 5 MG PO TABS: 5.0000 mg | ORAL_TABLET | Freq: Four times a day (QID) | ORAL | Status: DC | PRN
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
## 2018-05-07 NOTE — Progress Notes (Signed)
3 Days Post-Op  Subjective: Tanya Harris is an 85 yo female who is day 3 post-op from a laparoscopic left femoral hernia repair and small bowel resection for a strangulated left femoral hernia with SBO. She reported feeling a lot better this morning than she did yesterday. She reported not being in any pain this morning and discharged pain medications on her own due to them increasing nausea. She had a bowel movement this morning and is passing gas. She did not start fulls until this morning due to feeling nauseous yesterday. She reported eating cream of wheat and tolerating it well. Normal urine output. Denies nausea, vomiting. Denies shortness of breath, wheezing, cough. Ecchymosis of left proximal thigh continues to improve.   Objective: Vital signs in last 24 hours: Temp:  [97 F (36.1 C)-98.3 F (36.8 C)] 97 F (36.1 C) (03/04 0524) Pulse Rate:  [79-86] 79 (03/04 0524) Resp:  [16-20] 20 (03/04 0524) BP: (132-143)/(59-77) 133/61 (03/04 0524) SpO2:  [96 %-99 %] 99 % (03/04 0524) Last BM Date: 05/04/18  Intake/Output from previous day: 03/03 0701 - 03/04 0700 In: -  Out: 500 [Urine:500] Intake/Output this shift: No intake/output data recorded.  PE: General:Patient seems to be resting comfortable in no apparent pain or distress.  Cardiovascular: Regular rate and rhythm. No murmur, rub, gallop. UE and LE pulses 2+ equal bilaterally. Respiratory:CTA, no wheezing or crackles. Gastrointestinal: Normoactive bowel sounds. Mildly TTP LLQ. Abdomen soft. No rebound tenderness, guarding, rigidity.  Extremities: Ecchymosis noted on left proximal thigh but improving. No edema distally. No calf pain.  Integumentary:Blood on honeycomb dressing. No pus or drainage.  Psych: Alert and oriented x3.  Lab Results:  Recent Labs    05/04/18 1600  WBC 10.0  HGB 13.8  HCT 42.7  PLT 276   BMET Recent Labs    05/04/18 1600 05/07/18 0627  NA 136 133*  K 4.3 3.4*  CL 104 103  CO2 23 25    GLUCOSE 133* 112*  BUN 28* 14  CREATININE 0.49 0.34*  CALCIUM 9.4 8.2*   PT/INR No results for input(s): LABPROT, INR in the last 72 hours. CMP     Component Value Date/Time   NA 133 (L) 05/07/2018 0627   K 3.4 (L) 05/07/2018 0627   CL 103 05/07/2018 0627   CO2 25 05/07/2018 0627   GLUCOSE 112 (H) 05/07/2018 0627   BUN 14 05/07/2018 0627   CREATININE 0.34 (L) 05/07/2018 0627   CALCIUM 8.2 (L) 05/07/2018 0627   PROT 7.3 05/04/2018 1600   ALBUMIN 4.0 05/04/2018 1600   AST 28 05/04/2018 1600   ALT 16 05/04/2018 1600   ALKPHOS 81 05/04/2018 1600   BILITOT 0.8 05/04/2018 1600   GFRNONAA >60 05/07/2018 0627   GFRAA >60 05/07/2018 0627   Lipase  No results found for: LIPASE  Studies/Results: No results found.  Anti-infectives: Anti-infectives (From admission, onward)   Start     Dose/Rate Route Frequency Ordered Stop   05/05/18 0600  clindamycin (CLEOCIN) IVPB 900 mg  Status:  Discontinued     900 mg 100 mL/hr over 30 Minutes Intravenous On call to O.R. 05/04/18 2042 05/06/18 0559   05/05/18 0600  gentamicin (GARAMYCIN) 340 mg in dextrose 5 % 100 mL IVPB     5 mg/kg  67.6 kg 108.5 mL/hr over 60 Minutes Intravenous On call to O.R. 05/04/18 2042 05/04/18 2229   05/05/18 0130  ceFAZolin (ANCEF) IVPB 2g/100 mL premix     2 g 200 mL/hr  over 30 Minutes Intravenous Every 8 hours 05/05/18 0121 05/08/18 2159   05/05/18 0130  metroNIDAZOLE (FLAGYL) IVPB 500 mg     500 mg 100 mL/hr over 60 Minutes Intravenous Every 6 hours 05/05/18 0121 05/05/18 2100   05/04/18 2310  clindamycin (CLEOCIN) 900 mg, gentamicin (GARAMYCIN) 240 mg in sodium chloride 0.9 % 1,000 mL for intraperitoneal lavage  Status:  Discontinued       As needed 05/05/18 0524 05/05/18 0524   05/04/18 2230  clindamycin (CLEOCIN) 900 mg, gentamicin (GARAMYCIN) 240 mg in sodium chloride 0.9 % 1,000 mL for intraperitoneal lavage  Status:  Discontinued    Note to Pharmacy:  Have in the  [place] room for final irrigation in bowel  surgery case to minimize risk of abscess/infection Pharmacy may adjust dosing strength, schedule, rate of infusion, etc as needed to optimize therapy    Intraperitoneal To Surgery 05/04/18 2226 05/05/18 0115   05/04/18 2124  clindamycin (CLEOCIN) 900 MG/50ML IVPB    Note to Pharmacy:  Elisha Headland  : cabinet override      05/04/18 2124 05/05/18 0929     Assessment/Plan Post-op laparoscopic left femoral hernia repair and small bowel resection for strangulated left femoral hernia and SBO Continue fulls as tolerated. Continue to ice left proximal thigh for 20 min intervals throughout the day to decrease ecchymosis. See PT/OT today.   FEN - IVF VTE - SCDs, Aspirin, ambulating  ID - Ancef   LOS: 2 days    Deforest Hoyles , PA-S [facility] 05/07/2018, 8:34 AM Pager: [phone]

## 2018-05-08 ENCOUNTER — Other Ambulatory Visit (INDEPENDENT_AMBULATORY_CARE_PROVIDER_SITE_OTHER): Payer: Self-pay | Admitting: Physician Assistant

## 2018-05-08 MED ORDER — POLYETHYLENE GLYCOL 3350 17 G PO PACK: 17.0000 g | PACK | Freq: Every day | ORAL | 0 refills | Status: AC

## 2018-05-08 MED ORDER — DOCUSATE SODIUM 100 MG PO CAPS: 100.0000 mg | ORAL_CAPSULE | Freq: Every day | ORAL | 2 refills | Status: DC | PRN

## 2018-05-08 MED ORDER — ACETAMINOPHEN 325 MG PO TABS: 650.0000 mg | ORAL_TABLET | Freq: Four times a day (QID) | ORAL | 0 refills | Status: DC | PRN

## 2018-05-08 MED ORDER — OXYCODONE HCL 5 MG PO TABS: 5.0000 mg | ORAL_TABLET | Freq: Four times a day (QID) | ORAL | 0 refills | Status: DC | PRN

## 2018-05-08 NOTE — Discharge Summary (Signed)
[facility] Discharge Summary   Patient ID: Tanya Harris MRN: [id number] DOB/AGE: 1932/05/27 85 y.o.  Admit date: 05/04/2018 Discharge date: 05/08/2018  Admitting Diagnosis: Incarcerated femoral vs inguinal hernia with small bowel obstruction  Discharge Diagnosis Patient Active Problem List   Diagnosis Date Noted  . Scoliosis, levoconvex 05/04/2018  . Strangulated left femoral hernia s/p lap SB resction & hernia repair 05/04/2018 05/04/2018  . SBO (small bowel obstruction) ([place]) 05/04/2018  . Fibroid uterus 05/04/2018  . Abdominal wall asymmetry 05/04/2018  . Diverticulosis of left colon 05/04/2018  . Hyperglycemia 05/04/2018  . Spinal stenosis   . Rotator cuff tendinitis, left 01/03/2018  . History of excessive cerumen 12/04/2017  . Chronic pain of left knee 10/01/2017  . Fall at home 05/03/2017  . Lesion of skin of breast 12/12/2015  . Screening, ischemic heart disease 12/08/2014  . HOH (hard of hearing) 12/08/2014  . Hyperlipidemia 06/04/2014  . Buckle type impacted fracture of the distal left radial metaphysis 05/04/2014  . Closed nondisplaced fracture of styloid process of left ulna 05/04/2014  . Chronic allergic rhinitis 04/15/2014  . Medicare annual wellness visit, subsequent 11/30/2013  . COPD with chronic bronchitis ([place]) 11/02/2013  . Osteopenia 11/02/2013  . Gait instability 11/02/2013    Consultants PT  Procedures Dr. Mayvis Agudelo [place] (05/05/18) - Diagnostic laparoscopy with left femoral hernia repair and small bowel resection  POST-OPERATIVE DIAGNOSIS:  Strangulated left femoral hernia with small bowel obstruction  Hospital Course:  Tanya Harris is a 85yo female who presented to [facility] with abdominal pain, cramping, and lump in her left groin.  Workup showed left femoral hernia containing loops of small bowel and SBO at the basis of the hernia. General surgery was consulted. Patient was admitted and underwent emergent procedure listed above.  The  patient tolerated procedure well and was transferred to the floor.  Foley was removed POD 1. Diet was advanced as tolerated.  Patient worked with PT during this admission who recommended no PT follow up. Home DME 3 n 1 ordered. On POD 3, the patient was voiding well, tolerating diet, ambulating well (with walker, her baseline), pain well controlled with oral medications, vital signs stable, incisions c/d/i and felt stable for discharge home (she lives at home with husband). Patient was noted to have large area of ecchymosis across left flank/abdomen/upper thigh that was improving at the time of discharge. Patient will follow up as below and knows to call with questions or concerns.   I have personally reviewed the patients medication history on the [place] controlled substance database.   I was not directly involved in this patient's care therefore the information in this discharge summary was taken from the chart.  Physical Exam: General:  Alert, NAD, pleasant, comfortable Pulm: CTA b/l, effort normal Abd:  Soft, ND, appropriately tender, tegaderm removed, multiple lap incisions C/D/I. No evidence of hernia. +BS.  Skin: Ecchymosis noted to the left flank, left abdomen, and left proximal thigh. Minimal to no ttp. No warmth or induration.  Ext: No edema  Allergies as of 05/08/2018      Reactions   Ibuprofen Swelling   Mouth Swelling- however pt confirms that she is able to take mobic with no problems 11/2016   Amoxicillin Nausea And Vomiting   Did it involve swelling of the face/tongue/throat, SOB, or low BP? no Did it involve sudden or severe rash/hives, skin peeling, or any reaction on the inside of your mouth or nose? no Did you need to seek medical attention at  a hospital or doctor's office? no  When did it last happen?2005 If all above answers are "NO", may proceed with cephalosporin use.      Medication List    STOP taking these medications   clindamycin 300 MG capsule Commonly known  as:  CLEOCIN     TAKE these medications   acetaminophen 325 MG tablet Commonly known as:  TYLENOL Take 2 tablets (650 mg total) by mouth every 6 (six) hours as needed.   aspirin 81 MG tablet Take 81 mg by mouth daily.   CALCIUM 600+D 600-400 MG-UNIT tablet Generic drug:  Calcium Carbonate-Vitamin D Take 2 tablets by mouth daily.   docusate sodium 100 MG capsule Commonly known as:  COLACE Take 1 capsule (100 mg total) by mouth daily as needed.   fluticasone 50 MCG/ACT nasal spray Commonly known as:  FLONASE Place 2 sprays into both nostrils daily.   Fluticasone-Salmeterol 250-50 MCG/DOSE Aepb Commonly known as:  ADVAIR Inhale 1 puff into the lungs 2 (two) times daily.   glucosamine-chondroitin 500-400 MG tablet Take 1 tablet by mouth daily.   guaiFENesin 600 MG 12 hr tablet Commonly known as:  MUCINEX Take 600 mg by mouth 2 (two) times daily as needed for cough or to loosen phlegm.   meloxicam 15 MG tablet Commonly known as:  MOBIC TAKE 1 TABLET(15 MG) BY MOUTH DAILY AS NEEDED FOR PAIN What changed:  See the new instructions.   multivitamin tablet Take 1 tablet by mouth daily.   omega-3 fish oil 1000 MG Caps capsule Commonly known as:  MAXEPA Take 2 capsules by mouth daily. TAKING 1200mg  DAILY   OVER THE COUNTER MEDICATION Apply 1 application topically as needed (neuropathy/feet). OTC Pain relieving foot cream: Magnilife DB   oxyCODONE 5 MG immediate release tablet Commonly known as:  Oxy IR/ROXICODONE Take 1 tablet (5 mg total) by mouth every 6 (six) hours as needed for moderate pain or severe pain.   polyethylene glycol packet Commonly known as:  MIRALAX / GLYCOLAX Take 17 g by mouth daily.   polyvinyl alcohol 1.4 % ophthalmic solution Commonly known as:  LIQUIFILM TEARS Place 1 drop into both eyes as needed for dry eyes.   trolamine salicylate 10 % cream Commonly known as:  ASPERCREME Apply 1 application topically as needed for muscle pain (shoulder  pain).            Durable Medical Equipment  (From admission, onward)         Start     Ordered   05/08/18 0717  For home use only DME 3 n 1  Once     05/08/18 9371           Follow-up Information    Lamorris Knoblock [place], MD Follow up.   Specialty:  General Surgery Why:  Please call for your appointment time. We are working hard on scheduling this. Please arrive 30 minutes early for paperwork. Please bring a copy of your photo ID and insurance card.  Contact information: [address] [place] [address] [phone]           Signed: Jillyn Ledger, [facility] 05/08/2018, 10:47 AM Pager: [phone]

## 2018-05-08 NOTE — Care Management Important Message (Signed)
Important Message  Patient Details  Name: Tanya Harris MRN: [id number] Date of Birth: [birth year]   Medicare Important Message Given:  Yes    Maurine Mowbray 05/08/2018, 9:22 AM

## 2018-05-08 NOTE — Progress Notes (Signed)
Tanya Harris to be D/C'd Home per MD order.  Discussed prescriptions and follow up appointments with the patient. Prescriptions given to patient, medication list explained in detail. Pt verbalized understanding.  Allergies as of 05/08/2018      Reactions   Ibuprofen Swelling   Mouth Swelling- however pt confirms that she is able to take mobic with no problems 11/2016   Amoxicillin Nausea And Vomiting   Did it involve swelling of the face/tongue/throat, SOB, or low BP? no Did it involve sudden or severe rash/hives, skin peeling, or any reaction on the inside of your mouth or nose? no Did you need to seek medical attention at a hospital or doctor's office? no  When did it last happen?2005 If all above answers are "NO", may proceed with cephalosporin use.      Medication List    STOP taking these medications   clindamycin 300 MG capsule Commonly known as:  CLEOCIN     TAKE these medications   acetaminophen 325 MG tablet Commonly known as:  TYLENOL Take 2 tablets (650 mg total) by mouth every 6 (six) hours as needed.   aspirin 81 MG tablet Take 81 mg by mouth daily.   CALCIUM 600+D 600-400 MG-UNIT tablet Generic drug:  Calcium Carbonate-Vitamin D Take 2 tablets by mouth daily.   docusate sodium 100 MG capsule Commonly known as:  COLACE Take 1 capsule (100 mg total) by mouth daily as needed.   fluticasone 50 MCG/ACT nasal spray Commonly known as:  FLONASE Place 2 sprays into both nostrils daily.   Fluticasone-Salmeterol 250-50 MCG/DOSE Aepb Commonly known as:  ADVAIR Inhale 1 puff into the lungs 2 (two) times daily.   glucosamine-chondroitin 500-400 MG tablet Take 1 tablet by mouth daily.   guaiFENesin 600 MG 12 hr tablet Commonly known as:  MUCINEX Take 600 mg by mouth 2 (two) times daily as needed for cough or to loosen phlegm.   meloxicam 15 MG tablet Commonly known as:  MOBIC TAKE 1 TABLET(15 MG) BY MOUTH DAILY AS NEEDED FOR PAIN What changed:  See the new  instructions.   multivitamin tablet Take 1 tablet by mouth daily.   omega-3 fish oil 1000 MG Caps capsule Commonly known as:  MAXEPA Take 2 capsules by mouth daily. TAKING 1200mg  DAILY   OVER THE COUNTER MEDICATION Apply 1 application topically as needed (neuropathy/feet). OTC Pain relieving foot cream: Magnilife DB   oxyCODONE 5 MG immediate release tablet Commonly known as:  Oxy IR/ROXICODONE Take 1 tablet (5 mg total) by mouth every 6 (six) hours as needed for moderate pain or severe pain.   polyethylene glycol packet Commonly known as:  MIRALAX / GLYCOLAX Take 17 g by mouth daily.   polyvinyl alcohol 1.4 % ophthalmic solution Commonly known as:  LIQUIFILM TEARS Place 1 drop into both eyes as needed for dry eyes.   trolamine salicylate 10 % cream Commonly known as:  ASPERCREME Apply 1 application topically as needed for muscle pain (shoulder pain).            Durable Medical Equipment  (From admission, onward)         Start     Ordered   05/08/18 0717  For home use only DME 3 n 1  Once     05/08/18 0716          Vitals:   05/07/18 2005 05/08/18 0508  BP:  130/64  Pulse: 84 88  Resp: 19 17  Temp: 97.9 F (36.6 C) 98  F (36.7 C)  SpO2: 97% 95%    Skin clean, dry and intact without evidence of skin break down, no evidence of skin tears noted. IV catheter discontinued intact. Site without signs and symptoms of complications. Dressing and pressure applied. Pt denies pain at this time. No complaints noted.  An After Visit Summary was printed and given to the patient. Patient escorted via [place], and D/C home via private auto.  Lolita Rieger 05/08/2018 11:47 AM

## 2018-09-28 ENCOUNTER — Encounter: Payer: Self-pay | Admitting: Family Medicine

## 2018-09-29 ENCOUNTER — Other Ambulatory Visit: Payer: Self-pay

## 2018-09-29 ENCOUNTER — Ambulatory Visit (HOSPITAL_BASED_OUTPATIENT_CLINIC_OR_DEPARTMENT_OTHER)
Admission: RE | Admit: 2018-09-29 | Discharge: 2018-09-29 | Disposition: A | Discharge status: Home / Self Care | Payer: Medicare Other | Source: Ambulatory Visit | Attending: Family Medicine | Admitting: Family Medicine

## 2018-09-29 ENCOUNTER — Ambulatory Visit (INDEPENDENT_AMBULATORY_CARE_PROVIDER_SITE_OTHER): Payer: Medicare Other | Admitting: Family Medicine

## 2018-09-29 ENCOUNTER — Encounter: Payer: Self-pay | Admitting: Family Medicine

## 2018-09-29 VITALS — BP 126/80 | HR 77 | Temp 97.6°F | Resp 16

## 2018-09-29 DIAGNOSIS — M545 Low back pain, unspecified: Secondary | ICD-10-CM

## 2018-09-29 DIAGNOSIS — M1991 Primary osteoarthritis, unspecified site: Secondary | ICD-10-CM | POA: Diagnosis not present

## 2018-09-29 DIAGNOSIS — E2839 Other primary ovarian failure: Secondary | ICD-10-CM

## 2018-09-29 DIAGNOSIS — J302 Other seasonal allergic rhinitis: Secondary | ICD-10-CM | POA: Diagnosis not present

## 2018-09-29 DIAGNOSIS — R911 Solitary pulmonary nodule: Secondary | ICD-10-CM | POA: Diagnosis not present

## 2018-09-29 DIAGNOSIS — J449 Chronic obstructive pulmonary disease, unspecified: Secondary | ICD-10-CM

## 2018-09-29 MED ORDER — FLUTICASONE-SALMETEROL 250-50 MCG/DOSE IN AEPB: 1.0000 | INHALATION_SPRAY | Freq: Two times a day (BID) | RESPIRATORY_TRACT | 11 refills | Status: DC

## 2018-09-29 MED ORDER — ACETAMINOPHEN-CODEINE #3 300-30 MG PO TABS: 1.0000 | ORAL_TABLET | Freq: Four times a day (QID) | ORAL | 0 refills | Status: DC | PRN

## 2018-09-29 MED ORDER — MELOXICAM 15 MG PO TABS: ORAL_TABLET | ORAL | 1 refills | Status: DC

## 2018-09-29 MED ORDER — FLUTICASONE PROPIONATE 50 MCG/ACT NA SUSP: 2.0000 | Freq: Every day | NASAL | 11 refills | Status: DC

## 2018-09-29 NOTE — Progress Notes (Addendum)
[facility] [place], [place], [place] [address] [phone]  Date:  09/29/2018   Name:  Tanya Harris   DOB:  1933-01-19   MRN:  [id number]  PCP:  Darreld Mclean, MD    Chief Complaint: Back Pain (chronic arthritis in back, lower back, started wednesday, no known injruy)   History of Present Illness:  Tanya Harris is a 85 y.o. very pleasant female patient who presents with the following:  Pt contacted me via mychart with low back pain- I asked her to come in for eval  About 5 days ago she awoke with severe lower back pain NKI recently- no falls or any  She did have surgery on 3/1 for a hernia  She has more pain with walking or changing position Heat and tylenol can help, she is using aspercreme as well No change in her bowel or bladder function except she may go 2-3 days between [facility]  She had one day of pain radiation down her left leg, but this now resolved   She is trying to walk for exercise, but the gym has been closed due to  She was given oxycodone at the hospital for her operation but did not fill an rx outpt   Patient Active Problem List   Diagnosis Date Noted  . Scoliosis, levoconvex 05/04/2018  . Strangulated left femoral hernia s/p lap SB resction & hernia repair 05/04/2018 05/04/2018  . SBO (small bowel obstruction) ([place]) 05/04/2018  . Fibroid uterus 05/04/2018  . Abdominal wall asymmetry 05/04/2018  . Diverticulosis of left colon 05/04/2018  . Hyperglycemia 05/04/2018  . Spinal stenosis   . Rotator cuff tendinitis, left 01/03/2018  . History of excessive cerumen 12/04/2017  . Chronic pain of left knee 10/01/2017  . Fall at home 05/03/2017  . Lesion of skin of breast 12/12/2015  . Screening, ischemic heart disease 12/08/2014  . HOH (hard of hearing) 12/08/2014  . Hyperlipidemia 06/04/2014  . Buckle type impacted fracture of the distal left radial metaphysis 05/04/2014  . Closed  nondisplaced fracture of styloid process of left ulna 05/04/2014  . Chronic allergic rhinitis 04/15/2014  . Medicare annual wellness visit, subsequent 11/30/2013  . COPD with chronic bronchitis ([place]) 11/02/2013  . Osteopenia 11/02/2013  . Gait instability 11/02/2013    Past Medical History:  Diagnosis Date  . Buckle type impacted fracture of the distal left radial metaphysis 05/04/2014  . Closed nondisplaced fracture of styloid process of left ulna 05/04/2014  . Coccygeal contusion 05/02/2015  . COPD (chronic obstructive pulmonary disease) (HCC)    Mild  . DJD (degenerative joint disease) of knee   . Fall at home 05/04/2018  . Rotator cuff tendinitis, left 01/03/2018  . Scoliosis   . Spinal stenosis     Past Surgical History:  Procedure Laterality Date  . APPENDECTOMY  2005  . CATARACT EXTRACTION     Bilateral  . LAPAROSCOPY N/A 05/04/2018   Procedure: LAPAROSCOPY DIAGNOSTIC , FEMORAL  HERNIA REPAIR WITH SMALL BOWEL RESECTION WITH TAP BLOCK;  Surgeon: Michael Boston, MD;  Location: [facility];  Service: General;  Laterality: N/A;  . NASAL SINUS SURGERY    . TONSILLECTOMY    . TOTAL KNEE ARTHROPLASTY  2001   Left    Social History   Tobacco Use  . Smoking status: Never Smoker  . Smokeless tobacco: Never Used  Substance Use Topics  . Alcohol use: No  . Drug use: Yes  Family History  Problem Relation Age of Onset  . Heart disease Mother 21       Deceased  . Diabetes Mother   . Heart disease Father 36       Deceased  . Asthma Father   . Heart disease Maternal Uncle   . Colon cancer Brother   . Diabetes Brother        #2  . Heart disease Brother        x2  . Healthy Sister   . Healthy Brother        x1  . Hyperlipidemia Son        x2    Allergies  Allergen Reactions  . Ibuprofen Swelling    Mouth Swelling- however pt confirms that she is able to take mobic with no problems 11/2016   . Amoxicillin Nausea And Vomiting    Did it involve swelling of the  face/tongue/throat, SOB, or low BP? no Did it involve sudden or severe rash/hives, skin peeling, or any reaction on the inside of your mouth or nose? no Did you need to seek medical attention at a hospital or doctor's office? no  When did it last happen?2005 If all above answers are "NO", may proceed with cephalosporin use.     Medication list has been reviewed and updated.  Current Outpatient Medications on File Prior to Visit  Medication Sig Dispense Refill  . acetaminophen (TYLENOL) 325 MG tablet Take 2 tablets (650 mg total) by mouth every 6 (six) hours as needed. 30 tablet 0  . aspirin 81 MG tablet Take 81 mg by mouth daily.    . Calcium Carbonate-Vitamin D (CALCIUM 600+D) 600-400 MG-UNIT per tablet Take 2 tablets by mouth daily.    Marland Kitchen docusate sodium (COLACE) 100 MG capsule Take 1 capsule (100 mg total) by mouth daily as needed. 30 capsule 2  . glucosamine-chondroitin 500-400 MG tablet Take 1 tablet by mouth daily.     . Multiple Vitamin (MULTIVITAMIN) tablet Take 1 tablet by mouth daily.    Marland Kitchen omega-3 fish oil (MAXEPA) 1000 MG CAPS capsule Take 2 capsules by mouth daily. TAKING 1200mg  DAILY    . OVER THE COUNTER MEDICATION Apply 1 application topically as needed (neuropathy/feet). OTC Pain relieving foot cream: Magnilife DB    . polyethylene glycol (MIRALAX / GLYCOLAX) packet Take 17 g by mouth daily. 14 each 0  . polyvinyl alcohol (LIQUIFILM TEARS) 1.4 % ophthalmic solution Place 1 drop into both eyes as needed for dry eyes.    Marland Kitchen trolamine salicylate (ASPERCREME) 10 % cream Apply 1 application topically as needed for muscle pain (shoulder pain).     No current facility-administered medications on file prior to visit.     Review of Systems:  As per HPI- otherwise negative.   Physical Examination: Vitals:   09/29/18 1120  BP: 126/80  Pulse: 77  Resp: 16  Temp: 97.6 F (36.4 C)  SpO2: 98%   Vitals:   There is no height or weight on file to calculate BMI. Ideal  Body Weight:    GEN: WDWN, NAD, Non-toxic, A & O x 3, well appearing older lady accompanied by her husband  HEENT: Atraumatic, Normocephalic. Neck supple. No masses, No LAD. Ears and Nose: No external deformity. CV: RRR, No M/G/R. No JVD. No thrill. No extra heart sounds. PULM: CTA B, no wheezes, crackles, rhonchi. No retractions. No resp. distress. No accessory muscle use. ABD: S, NT, ND. No rebound. No HSM.  Belly is benign  EXTR: No c/c/e NEURO sitting in WC but is able to get to her feet with a bit of help Pt has known significant scoliosis  PSYCH: Normally interactive. Conversant. Not depressed or anxious appearing.  Calm demeanor.  Normal strength and sensation of both LE, negative SLR She is tender over the mid to lower thoracic spine No redness or swelling, no skin findings   Assessment and Plan:   ICD-10-CM   1. Lumbar pain  M54.5 DG Lumbar Spine Complete    acetaminophen-codeine (TYLENOL #3) 300-30 MG tablet  2. Seasonal allergies  J30.2 fluticasone (FLONASE) 50 MCG/ACT nasal spray  3. Primary osteoarthritis, unspecified site  M19.91 meloxicam (MOBIC) 15 MG tablet  4. COPD with chronic bronchitis (HCC)  J44.9 Fluticasone-Salmeterol (ADVAIR) 250-50 MCG/DOSE AEPB   Here today with lower back pain, NKI Suspect a compression fracture Send for films now Refilled her standing meds as above She uses mobic a couple of times a week generally- ok to take daily for now if needed Also rx tylenol #3 for her to use if needed for more severe pain  Follow-up: No follow-ups on file.  Meds ordered this encounter  Medications  . fluticasone (FLONASE) 50 MCG/ACT nasal spray    Sig: Place 2 sprays into both nostrils daily.    Dispense:  16 g    Refill:  11  . meloxicam (MOBIC) 15 MG tablet    Sig: TAKE 1 TABLET(15 MG) BY MOUTH DAILY AS NEEDED FOR PAIN    Dispense:  90 tablet    Refill:  1    **Patient requests 90 days supply**  . Fluticasone-Salmeterol (ADVAIR) 250-50 MCG/DOSE AEPB     Sig: Inhale 1 puff into the lungs 2 (two) times daily.    Dispense:  60 each    Refill:  11  . acetaminophen-codeine (TYLENOL #3) 300-30 MG tablet    Sig: Take 1 tablet by mouth every 6 (six) hours as needed for moderate pain.    Dispense:  30 tablet    Refill:  0   Orders Placed This Encounter  Procedures  . DG Lumbar Spine Complete    @SIGN @    Signed Lamar Blinks, MD  Received her spine films-  Dg Lumbar Spine Complete  Result Date: 09/29/2018 CLINICAL DATA:  Severe low back pain. EXAM: LUMBAR SPINE - COMPLETE 4+ VIEW COMPARISON:  CT 05/04/2018 report. FINDINGS: Lumbar spine numbered the lowest segmented appearing lumbar shaped vertebrae on lateral view as L5. Diffuse severe osteopenia. Severe lumbar spine scoliosis concave right. Diffuse severe multilevel degenerative change. 4 mm retrolisthesis of L4 on L5. No acute bony abnormality identified. No evidence of fracture. IMPRESSION: Diffuse severe osteopenia, scoliosis, and multilevel degenerative change. 4 mm retrolisthesis L4 on L5. No acute bony abnormality. No evidence of fracture. Electronically Signed   By: Marcello Moores  Register   On: 09/29/2018 16:57   Called pt - no fracture  We plan to treat her for pain for a few days- if not improving can order MRI I also ordered a dexa to be done in October which will be her 2 year mark Went over recent CT scans of abd/pelvis from October and also March.  I called radiology on 7/28 to see if lung CT recommended on CT abd from October is still necessary- yes. Will order for her

## 2018-09-29 NOTE — Addendum Note (Signed)
Addended by: Darreld Mclean on: 09/29/2018 06:49 PM   Modules accepted: Orders

## 2018-09-29 NOTE — Patient Instructions (Signed)
Good to see you today-, but I am sorry that your back is hurting!  Please proceed to imaging on the ground floor to have your x-rays and then you can go home Ok to use meloxicam daily for your back right now if need be I also sent in tylenol #3- this has codeine which is a mild narcotic. It can cause drowsiness!   You can also use this as needed for your back pain- don't use regular tylenol in the same dose  Please keep me posted about your pain

## 2018-09-30 ENCOUNTER — Encounter: Payer: Self-pay | Admitting: Family Medicine

## 2018-09-30 DIAGNOSIS — M4127 Other idiopathic scoliosis, lumbosacral region: Secondary | ICD-10-CM

## 2018-09-30 DIAGNOSIS — M545 Low back pain, unspecified: Secondary | ICD-10-CM

## 2018-09-30 NOTE — Addendum Note (Signed)
Addended by: Lamar Blinks C on: 09/30/2018 12:59 PM   Modules accepted: Orders

## 2018-10-01 ENCOUNTER — Ambulatory Visit: Payer: Medicare Other | Admitting: Family Medicine

## 2018-10-02 ENCOUNTER — Other Ambulatory Visit: Payer: Self-pay

## 2018-10-06 MED ORDER — ACETAMINOPHEN-CODEINE #3 300-30 MG PO TABS: 1.0000 | ORAL_TABLET | Freq: Four times a day (QID) | ORAL | 0 refills | Status: DC | PRN

## 2018-10-06 NOTE — Addendum Note (Signed)
Addended by: Lamar Blinks C on: 10/06/2018 07:26 PM   Modules accepted: Orders

## 2018-10-15 DIAGNOSIS — M545 Low back pain: Secondary | ICD-10-CM | POA: Diagnosis not present

## 2018-10-15 DIAGNOSIS — M5136 Other intervertebral disc degeneration, lumbar region: Secondary | ICD-10-CM | POA: Diagnosis not present

## 2018-10-15 DIAGNOSIS — M6281 Muscle weakness (generalized): Secondary | ICD-10-CM | POA: Diagnosis not present

## 2018-10-23 DIAGNOSIS — M5136 Other intervertebral disc degeneration, lumbar region: Secondary | ICD-10-CM | POA: Diagnosis not present

## 2018-10-27 ENCOUNTER — Encounter: Payer: Self-pay | Admitting: Neurology

## 2018-10-27 ENCOUNTER — Other Ambulatory Visit: Payer: Self-pay

## 2018-10-27 ENCOUNTER — Ambulatory Visit (INDEPENDENT_AMBULATORY_CARE_PROVIDER_SITE_OTHER): Payer: Medicare Other | Admitting: Neurology

## 2018-10-27 VITALS — BP 141/68 | HR 67 | Temp 98.0°F | Wt 146.0 lb

## 2018-10-27 DIAGNOSIS — R269 Unspecified abnormalities of gait and mobility: Secondary | ICD-10-CM

## 2018-10-27 DIAGNOSIS — R202 Paresthesia of skin: Secondary | ICD-10-CM | POA: Diagnosis not present

## 2018-10-27 DIAGNOSIS — G629 Polyneuropathy, unspecified: Secondary | ICD-10-CM | POA: Diagnosis not present

## 2018-10-27 MED ORDER — TOPIRAMATE 25 MG PO TABS: 25.0000 mg | ORAL_TABLET | Freq: Two times a day (BID) | ORAL | 2 refills | Status: DC

## 2018-10-27 NOTE — Patient Instructions (Addendum)
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  I recommend we check neuropathy panel labs and EMG nerve conduction study.  Trial of Topamax 25 mg at night for her paresthesias to be increased if tolerated without side effects to twice daily and further if needed.  I have discussed possible side effects with the patient and husband and asked him to call me if needed.  She was encouraged to use her walker at all times and we discussed fall and safety precautions.  She will continue follow-up with Dr Nelva Bush for her compression fracture and back pain.  She will return for follow-up in 2 months or call earlier if necessary.  Neuropathic Pain Neuropathic pain is pain caused by damage to the nerves that are responsible for certain sensations in your body (sensory nerves). The pain can be caused by:  Damage to the sensory nerves that send signals to your spinal cord and brain (peripheral nervous system).  Damage to the sensory nerves in your brain or spinal cord (central nervous system). Neuropathic pain can make you more sensitive to pain. Even a minor sensation can feel very painful. This is usually a long-term condition that can be difficult to treat. The type of pain differs from person to person. It may:  Start suddenly (acute), or it may develop slowly and last for a long time (chronic).  Come and go as damaged nerves heal, or it may stay at the same level for years.  Cause emotional distress, loss of sleep, and a lower quality of life. What are the causes? The most common cause of this condition is diabetes. Many other diseases and conditions can also cause neuropathic pain. Causes of neuropathic pain can be classified as:  Toxic. This is caused by medicines and chemicals. The most common cause of toxic  neuropathic pain is damage from cancer treatments (chemotherapy).  Metabolic. This can be caused by: ? Diabetes. This is the most common disease that damages the nerves. ? Lack of vitamin B from long-term alcohol abuse.  Traumatic. Any injury that cuts, crushes, or stretches a nerve can cause damage and pain. A common example is feeling pain after losing an arm or leg (phantom limb pain).  Compression-related. If a sensory nerve gets trapped or compressed for a long period of time, the blood supply to the nerve can be cut off.  Vascular. Many blood vessel diseases can cause neuropathic pain by decreasing blood supply and oxygen to nerves.  Autoimmune. This type of pain results from diseases in which the body's defense system (immune system) mistakenly attacks sensory nerves. Examples of autoimmune diseases that can cause neuropathic pain include lupus and multiple sclerosis.  Infectious. Many types of viral infections can damage sensory nerves and cause pain. Shingles infection is a common cause of this type of pain.  Inherited. Neuropathic pain can be a symptom of many diseases that are passed down through families (genetic). What increases the risk? You are more likely to develop this condition if:  You have diabetes.  You smoke.  You drink too much alcohol.  You are taking certain medicines, including medicines that kill cancer cells (chemotherapy) or that treat immune system disorders. What are the signs or symptoms? The main symptom is pain. Neuropathic pain is often described as:  Burning.  Shock-like.  Stinging.  Hot or cold.  Itching. How  is this diagnosed? No single test can diagnose neuropathic pain. It is diagnosed based on:  Physical exam and your symptoms. Your health care provider will ask you about your pain. You may be asked to use a pain scale to describe how bad your pain is.  Tests. These may be done to see if you have a high sensitivity to pain and to  help find the cause and location of any sensory nerve damage. They include: ? Nerve conduction studies to test how well nerve signals travel through your sensory nerves (electrodiagnostic testing). ? Stimulating your sensory nerves through electrodes on your skin and measuring the response in your spinal cord and brain (somatosensory evoked potential).  Imaging studies, such as: ? X-rays. ? CT scan. ? MRI. How is this treated? Treatment for neuropathic pain may change over time. You may need to try different treatment options or a combination of treatments. Some options include:  Treating the underlying cause of the neuropathy, such as diabetes, kidney disease, or vitamin deficiencies.  Stopping medicines that can cause neuropathy, such as chemotherapy.  Medicine to relieve pain. Medicines may include: ? Prescription or over-the-counter pain medicine. ? Anti-seizure medicine. ? Antidepressant medicines. ? Pain-relieving patches that are applied to painful areas of skin. ? A medicine to numb the area (local anesthetic), which can be injected as a nerve block.  Transcutaneous nerve stimulation. This uses electrical currents to block painful nerve signals. The treatment is painless.  Alternative treatments, such as: ? Acupuncture. ? Meditation. ? Massage. ? Physical therapy. ? Pain management programs. ? Counseling. Follow these instructions at home: Medicines   Take over-the-counter and prescription medicines only as told by your health care provider.  Do not drive or use heavy machinery while taking prescription pain medicine.  If you are taking prescription pain medicine, take actions to prevent or treat constipation. Your health care provider may recommend that you: ? Drink enough fluid to keep your urine pale yellow. ? Eat foods that are high in fiber, such as fresh fruits and vegetables, whole grains, and beans. ? Limit foods that are high in fat and processed sugars,  such as fried or sweet foods. ? Take an over-the-counter or prescription medicine for constipation. Lifestyle   Have a good support system at home.  Consider joining a chronic pain support group.  Do not use any products that contain nicotine or tobacco, such as cigarettes and e-cigarettes. If you need help quitting, ask your health care provider.  Do not drink alcohol. General instructions  Learn as much as you can about your condition.  Work closely with all your health care providers to find the treatment plan that works best for you.  Ask your health care provider what activities are safe for you.  Keep all follow-up visits as told by your health care provider. This is important. Contact a health care provider if:  Your pain treatments are not working.  You are having side effects from your medicines.  You are struggling with tiredness (fatigue), mood changes, depression, or anxiety. Summary  Neuropathic pain is pain caused by damage to the nerves that are responsible for certain sensations in your body (sensory nerves).  Neuropathic pain may come and go as damaged nerves heal, or it may stay at the same level for years.  Neuropathic pain is usually a long-term condition that can be difficult to treat. Consider joining a chronic pain support group. This information is not intended to replace advice given to you by  your health care provider. Make sure you discuss any questions you have with your health care provider. Document Released: 11/17/2003 Document Revised: 06/12/2018 Document Reviewed: 03/08/2017 Elsevier Patient Education  2020 [employer].

## 2018-10-27 NOTE — Progress Notes (Signed)
[facility] [address] [place]. [place] [address] [phone]       OFFICE CONSULT NOTE  [name] Date of Birth:  21-Dec-1932 Medical Record Number:  [id number]   Referring MD: Suella Broad  Reason for Referral: Neuropathy  HPI: [name] is a 85 year old pleasant Caucasian lady seen today for initial office consultation visit for neuropathy.  History is obtained from the patient and her husband and review of referral notes.  Patient states that she has had bilateral tingling numbness in her feet for greater than 5 years while she was living in [place] and then moved to [place].  Primary care physician told her that she had a peripheral neuropathy but she did not see a neurologist and did not have EMG nerve conduction studies done.  Over the years she feels the paresthesias seem to be getting worse they have more severe now and constant.  They seem to be more with.  Is having activity and at night.  Previously they used to be at the bottom of her feet but now they seem to have progressed to involve up to her cough.  She is also noticed that her balance is off she stumbles a lot and her too often catches when she is trying to walk fast.  She has had a few minor falls but none in the last 1 year.  She has been using a walker for the last 4 years but mostly due to degenerative arthritis in her back as well as still need left knee surgery.  She denies any history of bowel bladder dysfunction, severe back injury or fall.  She saw Dr. Nelva Bush for pain in the low back radiating down the left leg.  X-ray of the lumbar spine were obtained on 09/29/2018 which I personally reviewed which showed just osteopenia without any compression fracture.  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  She has not had any recent EMG nerve conduction study or MRI scan of the lumbar spine.  She has not had any lab work for reversible causes of neuropathy.   She denies history of diabetes, alcohol intake or exposure to toxic medications which can cause neuropathy.  ROS:   14 system review of systems is positive for leg pain, tingling, numbness, gait imbalance, back pain, anxiety and all other systems negative PMH:  Past Medical History:  Diagnosis Date   Buckle type impacted fracture of the distal left radial metaphysis 05/04/2014   Closed nondisplaced fracture of styloid process of left ulna 05/04/2014   Coccygeal contusion 05/02/2015   COPD (chronic obstructive pulmonary disease) (HCC)    Mild   DJD (degenerative joint disease) of knee    Fall at home 05/04/2018   Rotator cuff tendinitis, left 01/03/2018   Scoliosis    Spinal stenosis     Social History:  Social History   Socioeconomic History   Marital status: Married    Spouse name: Not on file   Number of children: Not on file   Years of education: Not on file   Highest education level: Not on file  Occupational History   Not on file  Social Needs   Financial resource strain: Not on file   Food insecurity    Worry: Not on file    Inability: Not on file   Transportation needs    Medical: Not on file    Non-medical: Not on file  Tobacco Use   Smoking status: Never Smoker  Smokeless tobacco: Never Used  Substance and Sexual Activity   Alcohol use: No   Drug use: Yes   Sexual activity: Not on file  Lifestyle   Physical activity    Days per week: Not on file    Minutes per session: Not on file   Stress: Not on file  Relationships   Social connections    Talks on phone: Not on file    Gets together: Not on file    Attends religious service: Not on file    Active member of club or organization: Not on file    Attends meetings of clubs or organizations: Not on file    Relationship status: Not on file   Intimate partner violence    Fear of current or ex partner: Not on file    Emotionally abused: Not on file    Physically abused: Not on file     Forced sexual activity: Not on file  Other Topics Concern   Not on file  Social History Narrative   Moved from [place] to [place] in 2015    Medications:   Current Outpatient Medications on File Prior to Visit  Medication Sig Dispense Refill   acetaminophen (TYLENOL) 325 MG tablet Take 2 tablets (650 mg total) by mouth every 6 (six) hours as needed. 30 tablet 0   acetaminophen-codeine (TYLENOL #3) 300-30 MG tablet Take 1 tablet by mouth every 6 (six) hours as needed for moderate pain. 30 tablet 0   aspirin 81 MG tablet Take 81 mg by mouth daily.     Calcium Carbonate-Vitamin D (CALCIUM 600+D) 600-400 MG-UNIT per tablet Take 2 tablets by mouth daily.     docusate sodium (COLACE) 100 MG capsule Take 1 capsule (100 mg total) by mouth daily as needed. 30 capsule 2   fluticasone (FLONASE) 50 MCG/ACT nasal spray Place 2 sprays into both nostrils daily. 16 g 11   Fluticasone-Salmeterol (ADVAIR) 250-50 MCG/DOSE AEPB Inhale 1 puff into the lungs 2 (two) times daily. 60 each 11   glucosamine-chondroitin 500-400 MG tablet Take 1 tablet by mouth daily.      meloxicam (MOBIC) 15 MG tablet TAKE 1 TABLET(15 MG) BY MOUTH DAILY AS NEEDED FOR PAIN 90 tablet 1   Multiple Vitamin (MULTIVITAMIN) tablet Take 1 tablet by mouth daily.     omega-3 fish oil (MAXEPA) 1000 MG CAPS capsule Take 2 capsules by mouth daily. TAKING 1200mg  DAILY     OVER THE COUNTER MEDICATION Apply 1 application topically as needed (neuropathy/feet). OTC Pain relieving foot cream: Magnilife DB     polyethylene glycol (MIRALAX / GLYCOLAX) packet Take 17 g by mouth daily. 14 each 0   polyvinyl alcohol (LIQUIFILM TEARS) 1.4 % ophthalmic solution Place 1 drop into both eyes as needed for dry eyes.     trolamine salicylate (ASPERCREME) 10 % cream Apply 1 application topically as needed for muscle pain (shoulder pain).     No current facility-administered medications on file prior to visit.     Allergies:   Allergies  Allergen  Reactions   Ibuprofen Swelling    Mouth Swelling- however pt confirms that she is able to take mobic with no problems 11/2016    Amoxicillin Nausea And Vomiting    Did it involve swelling of the face/tongue/throat, SOB, or low BP? no Did it involve sudden or severe rash/hives, skin peeling, or any reaction on the inside of your mouth or nose? no Did you need to seek medical attention at a hospital or  doctor's office? no  When did it last happen?2005 If all above answers are NO, may proceed with cephalosporin use.     Physical Exam General: well developed, well nourished pleasant elderly Caucasian lady, seated, in no evident distress Head: head normocephalic and atraumatic.   Neck: supple with no carotid or supraclavicular bruits Cardiovascular: regular rate and rhythm, no murmurs Musculoskeletal: no deformity except mild kyphoscoliosis Skin:  no rash/petichiae Vascular:  Normal pulses all extremities  Neurologic Exam Mental Status: Awake and fully alert. Oriented to place and time. Recent and remote memory intact. Attention span, concentration and fund of knowledge appropriate. Mood and affect appropriate.  Cranial Nerves: Fundoscopic exam reveals sharp disc margins. Pupils equal, briskly reactive to light. Extraocular movements full without nystagmus. Visual fields full to confrontation. Hearing intact. Facial sensation intact. Face, tongue, palate moves normally and symmetrically.  Motor: Normal bulk and tone. Normal strength in all tested extremity muscles except mild weakness of ankle dorsiflexors left greater than right.. Sensory.: intact to touch , pinprick , position and vibratory sensation.  Diminished vibration sensation in both feet from ankle down.  Romberg sign is negative. Coordination: Rapid alternating movements normal in all extremities. Finger-to-nose and heel-to-shin performed accurately bilaterally. Gait and Station: Arises from chair with  difficulty. Stance  is broad-based.  Uses a wheeled walker.  Gait demonstrates mild ataxia and unable to stand on either foot unsupported or walk tandem.   Reflexes: 1+ and symmetric except both knee jerks are depressed and ankle jerks are absent. Toes downgoing.       ASSESSMENT: 85 year old Caucasian lady with a longstanding history of bilateral lower extremity paresthesias and gait imbalance likely from longstanding peripheral neuropathy of undetermined etiology.     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  I recommend we check neuropathy panel labs and EMG nerve conduction study.  Trial of Topamax 25 mg at night for her paresthesias to be increased if tolerated without side effects to twice daily and further if needed.  I have discussed possible side effects with the patient and husband and asked him to call me if needed.  She was encouraged to use her walker at all times and we discussed fall and safety precautions.  She will continue follow-up with Dr Nelva Bush for her compression fracture and back pain.  She will return for follow-up in 2 months or call earlier if necessary. Antony Contras, MD  [facility] [address] [place], [place] [address]  Phone [phone] Fax [phone] Note: This document was prepared with digital dictation and possible smart phrase technology. Any transcriptional errors that result from this process are unintentional.

## 2018-10-28 DIAGNOSIS — M5136 Other intervertebral disc degeneration, lumbar region: Secondary | ICD-10-CM | POA: Diagnosis not present

## 2018-10-30 LAB — NEUROPATHY PANEL
A/G Ratio: 1.1 (ref 0.7–1.7)
Albumin ELP: 3.9 g/dL (ref 2.9–4.4)
Alpha 1: 0.3 g/dL (ref 0.0–0.4)
Alpha 2: 0.8 g/dL (ref 0.4–1.0)
Angio Convert Enzyme: 35 U/L (ref 14–82)
Anti Nuclear Antibody (ANA): POSITIVE — AB
Beta: 0.9 g/dL (ref 0.7–1.3)
Gamma Globulin: 1.6 g/dL (ref 0.4–1.8)
Globulin, Total: 3.7 g/dL (ref 2.2–3.9)
Rheumatoid fact SerPl-aCnc: 11.2 IU/mL (ref 0.0–13.9)
Sed Rate: 18 mm/hr (ref 0–40)
TSH: 1.4 u[IU]/mL (ref 0.450–4.500)
Total Protein: 7.6 g/dL (ref 6.0–8.5)
Vit D, 25-Hydroxy: 40.7 ng/mL (ref 30.0–100.0)
Vitamin B-12: 823 pg/mL (ref 232–1245)

## 2018-10-30 LAB — HEMOGLOBIN A1C
Est. average glucose Bld gHb Est-mCnc: 111 mg/dL
Hgb A1c MFr Bld: 5.5 % (ref 4.8–5.6)

## 2018-10-31 ENCOUNTER — Telehealth: Payer: Self-pay | Admitting: Neurology

## 2018-10-31 NOTE — Telephone Encounter (Signed)
Before pt agrees to scheduling her NCV/EMG she would like ask questions to the RN about it.  Pt wants to know if it is covered by her insurance.  Pt was asked if she has not asked her insurance company about it being covered, she said she wants to discuss with RN 1st

## 2018-11-03 NOTE — Telephone Encounter (Signed)
I returned the call to the patient.  The purpose of the NCV/EMG tests was reviewed with her.  She is going to call her insurance to check on her coverage.

## 2018-11-04 ENCOUNTER — Encounter: Payer: Self-pay | Admitting: Family Medicine

## 2018-11-04 ENCOUNTER — Other Ambulatory Visit: Payer: Self-pay | Admitting: Family Medicine

## 2018-11-25 DIAGNOSIS — F5104 Psychophysiologic insomnia: Secondary | ICD-10-CM | POA: Diagnosis not present

## 2018-11-25 DIAGNOSIS — M5136 Other intervertebral disc degeneration, lumbar region: Secondary | ICD-10-CM | POA: Diagnosis not present

## 2018-11-26 ENCOUNTER — Ambulatory Visit (INDEPENDENT_AMBULATORY_CARE_PROVIDER_SITE_OTHER): Payer: Medicare Other

## 2018-11-26 ENCOUNTER — Other Ambulatory Visit: Payer: Self-pay

## 2018-11-26 DIAGNOSIS — Z23 Encounter for immunization: Secondary | ICD-10-CM | POA: Diagnosis not present

## 2018-12-11 ENCOUNTER — Other Ambulatory Visit: Payer: Self-pay

## 2018-12-11 ENCOUNTER — Ambulatory Visit (INDEPENDENT_AMBULATORY_CARE_PROVIDER_SITE_OTHER): Payer: Medicare Other | Admitting: Diagnostic Neuroimaging

## 2018-12-11 ENCOUNTER — Encounter (INDEPENDENT_AMBULATORY_CARE_PROVIDER_SITE_OTHER): Payer: Medicare Other | Admitting: Diagnostic Neuroimaging

## 2018-12-11 DIAGNOSIS — Z0289 Encounter for other administrative examinations: Secondary | ICD-10-CM

## 2018-12-11 DIAGNOSIS — G629 Polyneuropathy, unspecified: Secondary | ICD-10-CM | POA: Diagnosis not present

## 2018-12-14 ENCOUNTER — Other Ambulatory Visit: Payer: Self-pay | Admitting: Neurology

## 2018-12-15 ENCOUNTER — Other Ambulatory Visit: Payer: Self-pay

## 2018-12-15 MED ORDER — TOPIRAMATE 25 MG PO TABS: 25.0000 mg | ORAL_TABLET | Freq: Two times a day (BID) | ORAL | 3 refills | Status: DC

## 2018-12-18 NOTE — Procedures (Signed)
[facility]  NCS (NERVE CONDUCTION STUDY) WITH EMG (ELECTROMYOGRAPHY) REPORT   STUDY DATE: 12/11/18 PATIENT NAME: Tanya Harris DOB: 09/26/1932 MRN: [id number]  ORDERING CLINICIAN: Antony Contras, MD   TECHNOLOGIST: Sherre Scarlet ELECTROMYOGRAPHER: Earlean Polka. , MD  CLINICAL INFORMATION: 85 year old female with burning feet.  FINDINGS: NERVE CONDUCTION STUDY: Bilateral peroneal motor responses have normal distal latencies, normal amplitudes, slow conduction velocities.  Right tibial motor response is normal.  Left tibial motor response has decreased amplitude and slow conduction velocity.  Bilateral sural sensory responses are normal.  Bilateral superficial peroneal sensory sponsor prolonged peak latencies and decreased amplitudes.  Bilateral tibial F wave latencies are prolonged.   NEEDLE ELECTROMYOGRAPHY:  Needle examination of left vastus medialis, tibialis anterior, gastrocnemius is normal.   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    INTERPRETING PHYSICIAN:  Penni Bombard, MD Certified in Neurology, Neurophysiology and Neuroimaging  [facility] [address], [place], [place] [address] [phone]   [facility]    Nerve / Sites Muscle Latency Ref. Amplitude Ref. Rel Amp Segments Distance Velocity Ref. Area    ms ms mV mV %  cm m/s m/s mVms  R Peroneal - EDB     Ankle EDB 5.4 ?6.5 2.0 ?2.0 100 Ankle - EDB 9   6.8     Fib head EDB 12.4  1.2  60.9 Fib head - Ankle 27 39 ?44 4.2     Pop fossa EDB 15.4  1.2  103 Pop fossa - Fib head 10 34 ?44 4.0         Pop fossa - Ankle      L Peroneal - EDB     Ankle EDB 6.1 ?6.5 2.0 ?2.0 100 Ankle - EDB 9   6.2     Fib head EDB 12.3  1.8  89 Fib head - Ankle 26 42 ?44 5.5     Pop fossa EDB 15.1  1.7  97.2 Pop fossa - Fib head 10 36 ?44 5.2         Pop fossa - Ankle      R Tibial - AH     Ankle AH 4.6 ?5.8 4.4 ?4.0 100 Ankle -  AH 9   16.5     Pop fossa AH 13.6  3.3  75.9 Pop fossa - Ankle 37 41 ?41 16.8  L Tibial - AH     Ankle AH 4.8 ?5.8 1.7 ?4.0 100 Ankle - AH 9   6.2     Pop fossa AH 14.3  0.9  55 Pop fossa - Ankle 35 37 ?41 3.4             SNC    Nerve / Sites Rec. Site Peak Lat Ref.  Amp Ref. Segments Distance    ms ms V V  cm  R Sural - Ankle (Calf)     Calf Ankle 3.9 ?4.4 7 ?6 Calf - Ankle 14  L Sural - Ankle (Calf)     Calf Ankle 4.2 ?4.4 10 ?6 Calf - Ankle 14  R Superficial peroneal - Ankle     Lat leg Ankle 4.6 ?4.4 3 ?6 Lat leg - Ankle 14  L Superficial peroneal - Ankle     Lat leg Ankle 4.6 ?4.4 2 ?6 Lat leg - Ankle 14              F  Wave    Nerve F Lat Ref.  ms ms  R Tibial - AH 59.6 ?56.0  L Tibial - AH 64.6 ?56.0         EMG full       EMG Summary Table    Spontaneous MUAP Recruitment  Muscle IA Fib PSW Fasc Other Amp Dur. Poly Pattern  L. Vastus medialis Normal None None None _______ Normal Normal Normal Normal  L. Tibialis anterior Normal None None None _______ Normal Normal Normal Normal  L. Gastrocnemius (Medial head) Normal None None None _______ Normal Normal Normal Normal

## 2018-12-23 DIAGNOSIS — F5104 Psychophysiologic insomnia: Secondary | ICD-10-CM | POA: Diagnosis not present

## 2018-12-23 DIAGNOSIS — M5136 Other intervertebral disc degeneration, lumbar region: Secondary | ICD-10-CM | POA: Diagnosis not present

## 2018-12-31 DIAGNOSIS — M25562 Pain in left knee: Secondary | ICD-10-CM | POA: Diagnosis not present

## 2019-01-02 ENCOUNTER — Other Ambulatory Visit (HOSPITAL_COMMUNITY): Payer: Self-pay | Admitting: Physician Assistant

## 2019-01-02 ENCOUNTER — Other Ambulatory Visit: Payer: Self-pay | Admitting: Physician Assistant

## 2019-01-02 DIAGNOSIS — M25562 Pain in left knee: Secondary | ICD-10-CM

## 2019-01-12 ENCOUNTER — Encounter (HOSPITAL_COMMUNITY)
Admission: RE | Admit: 2019-01-12 | Discharge: 2019-01-12 | Disposition: A | Discharge status: Home / Self Care | Payer: Medicare Other | Source: Ambulatory Visit | Attending: Physician Assistant | Admitting: Physician Assistant

## 2019-01-12 ENCOUNTER — Other Ambulatory Visit: Payer: Self-pay

## 2019-01-12 DIAGNOSIS — M25562 Pain in left knee: Secondary | ICD-10-CM | POA: Diagnosis not present

## 2019-01-12 DIAGNOSIS — R948 Abnormal results of function studies of other organs and systems: Secondary | ICD-10-CM | POA: Diagnosis not present

## 2019-01-12 MED ORDER — TECHNETIUM TC 99M MEDRONATE IV KIT
21.4000 | PACK | Freq: Once | INTRAVENOUS | Status: AC
  Administered 2019-01-12: 21.4 via INTRAVENOUS

## 2019-01-21 ENCOUNTER — Other Ambulatory Visit: Payer: Self-pay

## 2019-01-21 ENCOUNTER — Ambulatory Visit (INDEPENDENT_AMBULATORY_CARE_PROVIDER_SITE_OTHER): Payer: Medicare Other | Admitting: Neurology

## 2019-01-21 ENCOUNTER — Encounter: Payer: Self-pay | Admitting: Neurology

## 2019-01-21 VITALS — BP 156/79 | HR 74 | Temp 97.7°F | Wt 150.0 lb

## 2019-01-21 DIAGNOSIS — G6289 Other specified polyneuropathies: Secondary | ICD-10-CM | POA: Diagnosis not present

## 2019-01-21 MED ORDER — TOPIRAMATE 25 MG PO TABS: 100.0000 mg | ORAL_TABLET | Freq: Every day | ORAL | 3 refills | Status: DC

## 2019-01-21 NOTE — Patient Instructions (Signed)
I had a long discussion with the patient and her husband regarding her chronic longstanding peripheral neuropathy and paresthesias and discussed results of lab work and EMG nerve conduction study and answered questions.  I recommend she increase Topamax to 75 mg at night for 2 weeks and then 100 mg at night if tolerated.  If this is not effective or she has side effects may switch to Lyrica in the future.  She was advised to use a wheeled walker at all times and we discussed fall safety precautions.  She will return for follow-up in the future in 3 months with my nurse practitioner Janett Billow or call earlier if necessary.

## 2019-01-22 NOTE — Progress Notes (Signed)
[facility] [address] [place]. [place] [address] [phone]       OFFICE FOLLOW UP VISIT NOTE  [name] Date of Birth:  1932/12/22 Medical Record Number:  [id number]   Referring MD: Suella Broad  Reason for Referral: Neuropathy  HPI: Initial visit 10/27/2018 ;[name] is a 85 year old pleasant Caucasian lady seen today for initial office consultation visit for neuropathy.  History is obtained from the patient and her husband and review of referral notes.  Patient states that she has had bilateral tingling numbness in her feet for greater than 5 years while she was living in [place] and then moved to [place].  Primary care physician told her that she had a peripheral neuropathy but she did not see a neurologist and did not have EMG nerve conduction studies done.  Over the years she feels the paresthesias seem to be getting worse they have more severe now and constant.  They seem to be more with.  Is having activity and at night.  Previously they used to be at the bottom of her feet but now they seem to have progressed to involve up to her cough.  She is also noticed that her balance is off she stumbles a lot and her too often catches when she is trying to walk fast.  She has had a few minor falls but none in the last 1 year.  She has been using a walker for the last 4 years but mostly due to degenerative arthritis in her back as well as still need left knee surgery.  She denies any history of bowel bladder dysfunction, severe back injury or fall.  She saw Dr. Nelva Bush for pain in the low back radiating down the left leg.  X-ray of the lumbar spine were obtained on 09/29/2018 which I personally reviewed which showed just osteopenia without any compression fracture.  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  She has not had any recent EMG nerve conduction study or MRI scan of the lumbar spine.  She has not had any lab work for  reversible causes of neuropathy.  She denies history of diabetes, alcohol intake or exposure to toxic medications which can cause neuropathy. Update 01/21/2019 : She returns for follow-up after last visit 200 months ago.  She states she is tolerating Topamax 50 mg at night quite well without side effects but is not so sure that it is helping at all.  She does take trazodone 100 mg which helps her sleep.  She is not as much bothered during the day with the paresthesias and does not take any medicine for that.  She does apply Aspercreme locally which helps only temporarily for couple of hours.  She used to find some benefit with pool therapy but she is no longer able to do that because of knee pain.  She feels her back pain has improved and Dr. Alcide Evener feels she does not need vertebroplasty anymore.  Patient did undergo neuropathy panel labs at last visit which were all normal except elevated ANA but she does not have any clinical symptomatology to suggest lupus.  This is likely a lab false positive.  EMG nerve conduction study done on 12/11/2018 confirmed axonal peripheral polyneuropathy.  She continues to have paresthesias in the feet but feels her gait and balance are okay she has had no falls or injuries.  She does use a wheeled walker at all times..   ROS:   14 system  review of systems is positive for leg pain, tingling, numbness, gait imbalance, back pain, anxiety and all other systems negative PMH:  Past Medical History:  Diagnosis Date   Buckle type impacted fracture of the distal left radial metaphysis 05/04/2014   Closed nondisplaced fracture of styloid process of left ulna 05/04/2014   Coccygeal contusion 05/02/2015   COPD (chronic obstructive pulmonary disease) (HCC)    Mild   DJD (degenerative joint disease) of knee    Fall at home 05/04/2018   Rotator cuff tendinitis, left 01/03/2018   Scoliosis    Spinal stenosis     Social History:  Social History   Socioeconomic History   Marital  status: Married    Spouse name: Not on file   Number of children: Not on file   Years of education: Not on file   Highest education level: Not on file  Occupational History   Not on file  Social Needs   Financial resource strain: Not on file   Food insecurity    Worry: Not on file    Inability: Not on file   Transportation needs    Medical: Not on file    Non-medical: Not on file  Tobacco Use   Smoking status: Never Smoker   Smokeless tobacco: Never Used  Substance and Sexual Activity   Alcohol use: No   Drug use: Yes   Sexual activity: Not on file  Lifestyle   Physical activity    Days per week: Not on file    Minutes per session: Not on file   Stress: Not on file  Relationships   Social connections    Talks on phone: Not on file    Gets together: Not on file    Attends religious service: Not on file    Active member of club or organization: Not on file    Attends meetings of clubs or organizations: Not on file    Relationship status: Not on file   Intimate partner violence    Fear of current or ex partner: Not on file    Emotionally abused: Not on file    Physically abused: Not on file    Forced sexual activity: Not on file  Other Topics Concern   Not on file  Social History Narrative   Moved from [place] to [place] in 2015    Medications:   Current Outpatient Medications on File Prior to Visit  Medication Sig Dispense Refill   acetaminophen-codeine (TYLENOL #3) 300-30 MG tablet Take 1 tablet by mouth every 6 (six) hours as needed for moderate pain. 30 tablet 0   aspirin 81 MG tablet Take 81 mg by mouth daily.     Calcium Carbonate-Vitamin D (CALCIUM 600+D) 600-400 MG-UNIT per tablet Take 2 tablets by mouth daily.     fluticasone (FLONASE) 50 MCG/ACT nasal spray Place 2 sprays into both nostrils daily. 16 g 11   Fluticasone-Salmeterol (ADVAIR) 250-50 MCG/DOSE AEPB Inhale 1 puff into the lungs 2 (two) times daily. 60 each 11    glucosamine-chondroitin 500-400 MG tablet Take 1 tablet by mouth daily.      meloxicam (MOBIC) 15 MG tablet TAKE 1 TABLET(15 MG) BY MOUTH DAILY AS NEEDED FOR PAIN 90 tablet 1   Multiple Vitamin (MULTIVITAMIN) tablet Take 1 tablet by mouth daily.     omega-3 fish oil (MAXEPA) 1000 MG CAPS capsule Take 2 capsules by mouth daily. TAKING 1200mg  DAILY     OVER THE COUNTER MEDICATION Apply 1 application topically as needed (  neuropathy/feet). OTC Pain relieving foot cream: Magnilife DB     polyethylene glycol (MIRALAX / GLYCOLAX) packet Take 17 g by mouth daily. 14 each 0   polyvinyl alcohol (LIQUIFILM TEARS) 1.4 % ophthalmic solution Place 1 drop into both eyes as needed for dry eyes.     trolamine salicylate (ASPERCREME) 10 % cream Apply 1 application topically as needed for muscle pain (shoulder pain).     traZODone (DESYREL) 100 MG tablet Take 100 mg by mouth daily as needed.     No current facility-administered medications on file prior to visit.     Allergies:   Allergies  Allergen Reactions   Ibuprofen Swelling    Mouth Swelling- however pt confirms that she is able to take mobic with no problems 11/2016    Amoxicillin Nausea And Vomiting    Did it involve swelling of the face/tongue/throat, SOB, or low BP? no Did it involve sudden or severe rash/hives, skin peeling, or any reaction on the inside of your mouth or nose? no Did you need to seek medical attention at a hospital or doctor's office? no  When did it last happen?2005 If all above answers are NO, may proceed with cephalosporin use.     Physical Exam General: well developed, well nourished pleasant elderly Caucasian lady, seated, in no evident distress Head: head normocephalic and atraumatic.   Neck: supple with no carotid or supraclavicular bruits Cardiovascular: regular rate and rhythm, no murmurs Musculoskeletal: no deformity except mild kyphoscoliosis Skin:  no rash/petichiae Vascular:  Normal pulses all  extremities  Neurologic Exam Mental Status: Awake and fully alert. Oriented to place and time. Recent and remote memory intact. Attention span, concentration and fund of knowledge appropriate. Mood and affect appropriate.  Cranial Nerves: Fundoscopic exam reveals sharp disc margins. Pupils equal, briskly reactive to light. Extraocular movements full without nystagmus. Visual fields full to confrontation. Hearing diminished bilaterally. Facial sensation intact. Face, tongue, palate moves normally and symmetrically.  Motor: Normal bulk and tone. Normal strength in all tested extremity muscles except mild weakness of ankle dorsiflexors left greater than right.. Sensory.: intact to touch , pinprick , position and vibratory sensation.  Diminished vibration sensation in both feet from ankle down.  Romberg sign is negative. Coordination: Rapid alternating movements normal in all extremities. Finger-to-nose and heel-to-shin performed accurately bilaterally. Gait and Station: Arises from chair with  difficulty. Stance is broad-based.  Uses a wheeled walker.  Gait demonstrates mild ataxia and unable to stand on either foot unsupported or walk tandem.   Reflexes: 1+ and symmetric except both knee jerks are depressed and ankle jerks are absent. Toes downgoing.       ASSESSMENT: 85 year old Caucasian lady with a longstanding history of bilateral lower extremity paresthesias and gait imbalance likely from longstanding peripheral neuropathy of undetermined etiology.     PLAN: I had a long discussion with the patient and her husband regarding her chronic longstanding peripheral neuropathy and paresthesias and discussed results of lab work and EMG nerve conduction study and answered questions.  I recommend she increase Topamax to 75 mg at night for 2 weeks and then 100 mg at night if tolerated.  If this is not effective or she has side effects may switch to Lyrica in the future.  She was advised to use a wheeled  walker at all times and we discussed fall safety precautions.  Greater than 50% time during this 30-minute visit was spent on counseling and coordination of care about her neuropathy and paresthesias and  answering questions she will return for follow-up in the future in 3 months with my nurse practitioner Janett Billow or call earlier if necessary. Antony Contras, MD  [facility] [address] [place], [place] [address]  Phone [phone] Fax [phone] Note: This document was prepared with digital dictation and possible smart phrase technology. Any transcriptional errors that result from this process are unintentional.

## 2019-01-23 DIAGNOSIS — M25562 Pain in left knee: Secondary | ICD-10-CM | POA: Diagnosis not present

## 2019-01-23 DIAGNOSIS — Z96652 Presence of left artificial knee joint: Secondary | ICD-10-CM | POA: Diagnosis not present

## 2019-03-11 ENCOUNTER — Encounter: Payer: Self-pay | Admitting: Family Medicine

## 2019-03-15 ENCOUNTER — Other Ambulatory Visit: Payer: Self-pay | Admitting: Neurology

## 2019-03-17 ENCOUNTER — Other Ambulatory Visit: Payer: Self-pay

## 2019-03-17 MED ORDER — TOPIRAMATE 25 MG PO TABS: 100.0000 mg | ORAL_TABLET | Freq: Every day | ORAL | 0 refills | Status: DC

## 2019-03-27 ENCOUNTER — Other Ambulatory Visit: Payer: Self-pay | Admitting: Neurology

## 2019-04-05 ENCOUNTER — Ambulatory Visit: Payer: Medicare Other

## 2019-04-10 ENCOUNTER — Ambulatory Visit: Payer: Medicare Other

## 2019-04-12 ENCOUNTER — Ambulatory Visit: Payer: Medicare Other | Attending: Internal Medicine

## 2019-04-12 DIAGNOSIS — Z23 Encounter for immunization: Secondary | ICD-10-CM

## 2019-04-12 NOTE — Progress Notes (Signed)
   Covid-19 Vaccination Clinic  Name:  Tanya Harris    MRN: [id number] DOB: 09-Aug-1932  04/12/2019  Ms. [name] was observed post Covid-19 immunization for 15 minutes without incidence. She was provided with Vaccine Information Sheet and instruction to access the V-Safe system.   Ms. [name] was instructed to call 911 with any severe reactions post vaccine: Marland Kitchen Difficulty breathing  . Swelling of your face and throat  . A fast heartbeat  . A bad rash all over your body  . Dizziness and weakness    Immunizations Administered    Name Date Dose VIS Date Route   Pfizer COVID-19 Vaccine 04/12/2019  1:02 PM 0.3 mL 02/13/2019 Intramuscular   Manufacturer: [place]   Lot: [id number]   [place]: [id number]

## 2019-04-14 ENCOUNTER — Telehealth: Payer: Self-pay

## 2019-04-14 NOTE — Telephone Encounter (Signed)
Patient called stating she attempted to take 100mg  of her traZODone (DESYREL) 100 MG tablet however it caused her to have headaches and made her shaky and didn't feel good on the 100mg 

## 2019-04-14 NOTE — Telephone Encounter (Signed)
Ask to try Topamax 50 mg twice daily

## 2019-04-14 NOTE — Telephone Encounter (Signed)
I called pt about her trazodone.I stated Tanya Harris does not manage that medication.Pt stated she forgot to mention it was topamax. Pt took 100mg  at [facility] per Dr. Leonie Harris last note and it made her have headaches and shaky.She decrease back to 50mg  at [facility] but its not helping the neuropathy. She just pick up her 180 tablets last month of topamax. She may want to wait to change medications until she see Tanya Billow NP. She would like advice  From Dr Tanya Harris. I stated message will be sent to him.

## 2019-04-15 NOTE — Telephone Encounter (Signed)
I called pt that Dr.SEthi wants he to take topamax 50mg  in the am and 50mg  in the pm. Pt stated she does not need it during the day. She only needs the medication at night. Pt is going to take 50mg  at [facility] until next appt in April 2021 with Janett Billow NP. She stated the 100mg  Topamax was too much for her. She verbalized understanding.

## 2019-05-06 ENCOUNTER — Ambulatory Visit: Payer: Medicare Other | Attending: Internal Medicine

## 2019-05-06 ENCOUNTER — Ambulatory Visit: Payer: Medicare Other

## 2019-05-06 DIAGNOSIS — Z23 Encounter for immunization: Secondary | ICD-10-CM | POA: Insufficient documentation

## 2019-05-06 NOTE — Progress Notes (Signed)
   Covid-19 Vaccination Clinic  Name:  Tanya Harris    MRN: [id number] DOB: 1933/01/18  05/06/2019  [name] was observed post Covid-19 immunization for 15 minutes without incident. She was provided with Vaccine Information Sheet and instruction to access the V-Safe system.   [name] was instructed to call 911 with any severe reactions post vaccine: Marland Kitchen Difficulty breathing  . Swelling of face and throat  . A fast heartbeat  . A bad rash all over body  . Dizziness and weakness   Immunizations Administered    Name Date Dose VIS Date Route   Pfizer COVID-19 Vaccine 05/06/2019  2:45 PM 0.3 mL 02/13/2019 Intramuscular   Manufacturer: [place]   Lot: [id number]   [place]: [id number]

## 2019-05-07 ENCOUNTER — Ambulatory Visit: Payer: Medicare Other | Admitting: Adult Health

## 2019-05-14 DIAGNOSIS — M79604 Pain in right leg: Secondary | ICD-10-CM | POA: Diagnosis not present

## 2019-05-14 DIAGNOSIS — M5136 Other intervertebral disc degeneration, lumbar region: Secondary | ICD-10-CM | POA: Diagnosis not present

## 2019-05-14 DIAGNOSIS — M79605 Pain in left leg: Secondary | ICD-10-CM | POA: Diagnosis not present

## 2019-05-26 NOTE — Patient Instructions (Addendum)
It was great to see you again today You may be due for a tetanus vaccine if not given the last 10 years.  You can have this shot, and also the shingles vaccine, given at your pharmacy at your convenience I will be in touch with your labs We ordered a bone density scan today for you to have done at your convenience  Ok to continue trazodone as needed for sleep- I am glad to refill Refilled tylenol with codeine today- use as needed for more severe knee pain, watch as it may cause sleepiness/ dizziness!  Assuming all ok please see me in 6 months

## 2019-05-26 NOTE — Progress Notes (Addendum)
[facility] [address], [place], [place] [address] [phone]  Date:  05/28/2019   Name:  Tanya Harris   DOB:  [birth year]   MRN:  [id number]  PCP:  Darreld Mclean, MD    Chief Complaint: Knee Pain (left leg and knee pain, started friday night)   History of Present Illness:  Tanya Harris is a 86 y.o. very pleasant female patient who presents with the following:  Older lady with history of COPD, hearing loss, osteopenia, hyperlipidemia, spinal stenosis She saw neurology in November to discuss neuropathy- Dr Leonie Man recommended increasing Topamax, if this is not effective or well-tolerated switch to Lyrica Last seen by myself in July for lower back pain  Here today with concern of left knee pain Covid series is complete  She saw Dr Nelva Bush on 3/11 and was overall doing well, taking mobic a couple of times a week She notes LEFT knee pain again; he has seen ortho in the past, Dr Wynelle Link.  "the last time I went to see him it had quit hurting" She had her left knee replaced in 2001 She brings me a copy of a bone scan done in November per [facility]- it mentions possible loosening or hardware vs infection of the right knee.  Advised her that I do not routinely interpret bone scans so would defer to ordering provider.  However infection seems unlikely given chronic nature of her knee pain   She is not eager to have her knee operated on again  meloxicam does not always control her pain, but tylenol #3 does help her- she uses sparingly She did not have any acute injury or fall recently- she has tried to exercise and wonders if she over- did it  She is using trazodone for sleep as prescribed by Dr Nelva Bush- wonders if I can refill this for her, it does seem to help her sleep better  12/31/2018  1   12/31/2018  Acetaminophen-Cod #3 Tablet  14.00  [address]   [id number]   [facility] ([id number])   0  9.00 MME  Comm Ins   [place]  10/22/2018  1    10/22/2018  Diazepam 10 MG Tablet  2.00  1 Ri Ram   [id number]   [facility] ([id number])   0  2.00 LME  Comm Ins   [place]  10/06/2018  1   10/06/2018  Acetaminophen-Cod #3 Tablet  30.00  7 Je Cop   [id number]   [facility] ([id number])   0  19.29 MME  Comm Ins   [employer]  09/29/2018  1   09/29/2018  Acetaminophen-Cod #3 Tablet  30.00  7 Je Cop   [id number]   [facility] ([id number])   0  19.29 MME      We went over a CT report from 2019  IMPRESSION: Marked diastasis of the abdominal wall with protrusion of abdominal contents into the bilateral flanks, more prominent on the left, which may account for the swelling described by the patient.  Diffuse left colonic diverticulosis without evidence of acute diverticulitis.  Bulbous retroverted uterus with probable myometrial mass within the fundus.  Small area of ground-glass attenuation in the left lower lobe, which measures 2.1 cm, indeterminate. Initial follow-up with CT at 6-12 months is recommended to confirm persistence. If persistent, repeat CT is recommended every 2 years until 5 years of stability has been established. This recommendation follows the consensus statement: Guidelines for Management of Incidental Pulmonary  Nodules Detected on CT Images: From the Fleischner Society 2017; Radiology 2017; [phone].  She is not interested in following up her lung nodule at this time- "I don't want to know anything else that is wrong with me"  Patient Active Problem List   Diagnosis Date Noted  . Scoliosis, levoconvex 05/04/2018  . Strangulated left femoral hernia s/p lap SB resction & hernia repair 05/04/2018 05/04/2018  . SBO (small bowel obstruction) ([facility]) 05/04/2018  . Fibroid uterus 05/04/2018  . Abdominal wall asymmetry 05/04/2018  . Diverticulosis of left colon 05/04/2018  . Hyperglycemia 05/04/2018  . Spinal stenosis   . Rotator cuff tendinitis, left 01/03/2018  . History of excessive cerumen 12/04/2017  . Chronic pain of left knee 10/01/2017  . Fall at home 05/03/2017  . Lesion of  skin of breast 12/12/2015  . HOH (hard of hearing) 12/08/2014  . Hyperlipidemia 06/04/2014  . Buckle type impacted fracture of the distal left radial metaphysis 05/04/2014  . Closed nondisplaced fracture of styloid process of left ulna 05/04/2014  . Chronic allergic rhinitis 04/15/2014  . COPD with chronic bronchitis ([place]) 11/02/2013  . Osteopenia 11/02/2013  . Gait instability 11/02/2013    Past Medical History:  Diagnosis Date  . Buckle type impacted fracture of the distal left radial metaphysis 05/04/2014  . Closed nondisplaced fracture of styloid process of left ulna 05/04/2014  . Coccygeal contusion 05/02/2015  . COPD (chronic obstructive pulmonary disease) ([facility])    Mild  . DJD (degenerative joint disease) of knee   . Fall at home 05/04/2018  . Rotator cuff tendinitis, left 01/03/2018  . Scoliosis   . Spinal stenosis     Past Surgical History:  Procedure Laterality Date  . APPENDECTOMY  2005  . CATARACT EXTRACTION     Bilateral  . LAPAROSCOPY N/A 05/04/2018   Procedure: LAPAROSCOPY DIAGNOSTIC , FEMORAL  HERNIA REPAIR WITH SMALL BOWEL RESECTION WITH TAP BLOCK;  Surgeon: Michael Boston, MD;  Location: [facility];  Service: General;  Laterality: N/A;  . NASAL SINUS SURGERY    . TONSILLECTOMY    . TOTAL KNEE ARTHROPLASTY  2001   Left    Social History   Tobacco Use  . Smoking status: Never Smoker  . Smokeless tobacco: Never Used  Substance Use Topics  . Alcohol use: No  . Drug use: Yes    Family History  Problem Relation Age of Onset  . Heart disease Mother 72       Deceased  . Diabetes Mother   . Heart disease Father 40       Deceased  . Asthma Father   . Heart disease Maternal Uncle   . Colon cancer Brother   . Diabetes Brother        #2  . Heart disease Brother        x2  . Healthy Sister   . Healthy Brother        x1  . Hyperlipidemia Son        x2    Allergies  Allergen Reactions  . Ibuprofen Swelling    Mouth Swelling- however pt confirms that she is  able to take mobic with no problems 11/2016   . Amoxicillin Nausea And Vomiting    Did it involve swelling of the face/tongue/throat, SOB, or low BP? no Did it involve sudden or severe rash/hives, skin peeling, or any reaction on the inside of your mouth or nose? no Did you need to seek medical attention at a hospital or doctor's  office? no  When did it last happen?2005 If all above answers are "NO", may proceed with cephalosporin use.     Medication list has been reviewed and updated.  Current Outpatient Medications on File Prior to Visit  Medication Sig Dispense Refill  . acetaminophen-codeine (TYLENOL #3) 300-30 MG tablet Take 1 tablet by mouth every 6 (six) hours as needed for moderate pain. 30 tablet 0  . aspirin 81 MG tablet Take 81 mg by mouth daily.    . Calcium Carbonate-Vitamin D (CALCIUM 600+D) 600-400 MG-UNIT per tablet Take 2 tablets by mouth daily.    . fluticasone (FLONASE) 50 MCG/ACT nasal spray Place 2 sprays into both nostrils daily. 16 g 11  . Fluticasone-Salmeterol (ADVAIR) 250-50 MCG/DOSE AEPB Inhale 1 puff into the lungs 2 (two) times daily. 60 each 11  . gabapentin (NEURONTIN) 300 MG capsule Take 1 capsule (300 mg total) by mouth at bedtime. 90 capsule 3  . meloxicam (MOBIC) 15 MG tablet TAKE 1 TABLET(15 MG) BY MOUTH DAILY AS NEEDED FOR PAIN 90 tablet 1  . Multiple Vitamin (MULTIVITAMIN) tablet Take 1 tablet by mouth daily.    Marland Kitchen omega-3 fish oil (MAXEPA) 1000 MG CAPS capsule Take 2 capsules by mouth daily. TAKING 1200mg  DAILY    . OVER THE COUNTER MEDICATION Apply 1 application topically as needed (neuropathy/feet). OTC Pain relieving foot cream: Magnilife DB    . polyethylene glycol (MIRALAX / GLYCOLAX) packet Take 17 g by mouth daily. 14 each 0  . polyvinyl alcohol (LIQUIFILM TEARS) 1.4 % ophthalmic solution Place 1 drop into both eyes as needed for dry eyes.    . traZODone (DESYREL) 100 MG tablet Take 100 mg by mouth daily as needed.     No current  facility-administered medications on file prior to visit.    Review of Systems:  As per HPI- otherwise negative.   Physical Examination: Vitals:   05/28/19 0923  BP: 117/69  Pulse: 62  Resp: 16  Temp: 97.9 F (36.6 C)   Vitals:   05/28/19 0923  Weight: 146 lb (66.2 kg)  Height: 5\' 1"  (1.549 m)   Body mass index is 27.59 kg/m. Ideal Body Weight: Weight in (lb) to have BMI = 25: 132  GEN: no acute distress.  Looks well and her normal self, accompanied by her husband today  HEENT: Atraumatic, Normocephalic.  Ears and Nose: No external deformity. CV: RRR, No M/G/R. No JVD. No thrill. No extra heart sounds. PULM: CTA B, no wheezes, crackles, rhonchi. No retractions. No resp. distress. No accessory muscle use. ABD: S, NT, ND, +BS. No rebound. No HSM. EXTR: No c/c/e PSYCH: Normally interactive. Conversant.  Using a rolling walker Wearing a neoprene LEFT knee brace Left knee is s/p TKA, otherwise seems normal.  No effusion, heat or redness. Normal ROM   Assessment and Plan: Primary insomnia - Plan: traZODone (DESYREL) 100 MG tablet  Chronic pain of left knee - Plan: acetaminophen-codeine (TYLENOL #3) 300-30 MG tablet  Estrogen deficiency - Plan: DG Bone Density  Osteopenia, unspecified location - Plan: DG Bone Density  Solitary pulmonary nodule  Hyperlipidemia, unspecified hyperlipidemia type - Plan: Lipid panel  Hyperglycemia - Plan: Comprehensive metabolic panel  Medication monitoring encounter - Plan: CBC, Comprehensive metabolic panel  Here today with a couple of concerns. She has noted long standing knee pain. S/p TKR in 2001.  She states she was not sure if she should discuss with me or her surgeon.   At this time she really just wants  some tylenol 3 to use on occasion if needed.  Refilled for her today-cautioned regarding sedation Continue trazodone as needed for sleep Update bone density Declines follow-up of pulmonary nodule at this time, reasonable at  nearly 86 yo Will plan further follow- up pending labs. Moderate med decision making today   This visit occurred during the SARS-CoV-2 public health emergency.  Safety protocols were in place, including screening questions prior to the visit, additional usage of staff PPE, and extensive cleaning of exam room while observing appropriate contact time as indicated for disinfecting solutions.   Noted later that gabapentin just added to her regimen today by neurology.  Called pt to discuss polypharmacy risk but no answer. Can not LMOM.  Will send her a mychart message  Signed Lamar Blinks, MD  Received her labs as below, message to patient  Results for orders placed or performed in visit on 05/28/19  CBC  Result Value Ref Range   WBC 8.1 4.0 - 10.5 K/uL   RBC 4.28 3.87 - 5.11 Mil/uL   Platelets 248.0 150.0 - 400.0 K/uL   Hemoglobin 13.5 12.0 - 15.0 g/dL   HCT 39.0 36.0 - 46.0 %   MCV 91.0 78.0 - 100.0 fl   MCHC 34.7 30.0 - 36.0 g/dL   RDW 13.4 11.5 - 15.5 %  Comprehensive metabolic panel  Result Value Ref Range   Sodium 136 135 - 145 mEq/L   Potassium 3.8 3.5 - 5.1 mEq/L   Chloride 101 96 - 112 mEq/L   CO2 29 19 - 32 mEq/L   Glucose, Bld 129 (H) 70 - 99 mg/dL   BUN 23 6 - 23 mg/dL   Creatinine, Ser 0.62 0.40 - 1.20 mg/dL   Total Bilirubin 0.4 0.2 - 1.2 mg/dL   Alkaline Phosphatase 75 39 - 117 U/L   AST 21 0 - 37 U/L   ALT 13 0 - 35 U/L   Total Protein 7.0 6.0 - 8.3 g/dL   Albumin 3.9 3.5 - 5.2 g/dL   GFR 91.56 >60.00 mL/min   Calcium 9.6 8.4 - 10.5 mg/dL  Lipid panel  Result Value Ref Range   Cholesterol 197 0 - 200 mg/dL   Triglycerides 103.0 0.0 - 149.0 mg/dL   HDL 54.40 >39.00 mg/dL   VLDL 20.6 0.0 - 40.0 mg/dL   LDL Cholesterol 122 (H) 0 - 99 mg/dL   Total CHOL/HDL Ratio 4    NonHDL 142.55

## 2019-05-28 ENCOUNTER — Encounter: Payer: Self-pay | Admitting: Adult Health

## 2019-05-28 ENCOUNTER — Encounter: Payer: Self-pay | Admitting: Family Medicine

## 2019-05-28 ENCOUNTER — Telehealth (INDEPENDENT_AMBULATORY_CARE_PROVIDER_SITE_OTHER): Payer: Medicare Other | Admitting: Adult Health

## 2019-05-28 ENCOUNTER — Other Ambulatory Visit: Payer: Self-pay

## 2019-05-28 ENCOUNTER — Ambulatory Visit (INDEPENDENT_AMBULATORY_CARE_PROVIDER_SITE_OTHER): Payer: Medicare Other | Admitting: Family Medicine

## 2019-05-28 VITALS — BP 117/69 | HR 62 | Temp 97.9°F | Resp 16 | Ht 61.0 in | Wt 146.0 lb

## 2019-05-28 DIAGNOSIS — R269 Unspecified abnormalities of gait and mobility: Secondary | ICD-10-CM

## 2019-05-28 DIAGNOSIS — M25562 Pain in left knee: Secondary | ICD-10-CM

## 2019-05-28 DIAGNOSIS — G6289 Other specified polyneuropathies: Secondary | ICD-10-CM

## 2019-05-28 DIAGNOSIS — R739 Hyperglycemia, unspecified: Secondary | ICD-10-CM | POA: Diagnosis not present

## 2019-05-28 DIAGNOSIS — M8580 Other specified disorders of bone density and structure, unspecified site: Secondary | ICD-10-CM | POA: Diagnosis not present

## 2019-05-28 DIAGNOSIS — E2839 Other primary ovarian failure: Secondary | ICD-10-CM | POA: Diagnosis not present

## 2019-05-28 DIAGNOSIS — Z5181 Encounter for therapeutic drug level monitoring: Secondary | ICD-10-CM

## 2019-05-28 DIAGNOSIS — F5101 Primary insomnia: Secondary | ICD-10-CM | POA: Diagnosis not present

## 2019-05-28 DIAGNOSIS — E785 Hyperlipidemia, unspecified: Secondary | ICD-10-CM | POA: Diagnosis not present

## 2019-05-28 DIAGNOSIS — G8929 Other chronic pain: Secondary | ICD-10-CM | POA: Diagnosis not present

## 2019-05-28 DIAGNOSIS — R911 Solitary pulmonary nodule: Secondary | ICD-10-CM

## 2019-05-28 LAB — COMPREHENSIVE METABOLIC PANEL
ALT: 13 U/L (ref 0–35)
AST: 21 U/L (ref 0–37)
Albumin: 3.9 g/dL (ref 3.5–5.2)
Alkaline Phosphatase: 75 U/L (ref 39–117)
BUN: 23 mg/dL (ref 6–23)
CO2: 29 mEq/L (ref 19–32)
Calcium: 9.6 mg/dL (ref 8.4–10.5)
Chloride: 101 mEq/L (ref 96–112)
Creatinine, Ser: 0.62 mg/dL (ref 0.40–1.20)
GFR: 91.56 mL/min (ref >60.00)
Glucose, Bld: 129 mg/dL — ABNORMAL HIGH (ref 70–99)
Potassium: 3.8 mEq/L (ref 3.5–5.1)
Sodium: 136 mEq/L (ref 135–145)
Total Bilirubin: 0.4 mg/dL (ref 0.2–1.2)
Total Protein: 7 g/dL (ref 6.0–8.3)

## 2019-05-28 LAB — CBC
HCT: 39 % (ref 36.0–46.0)
Hemoglobin: 13.5 g/dL (ref 12.0–15.0)
MCHC: 34.7 g/dL (ref 30.0–36.0)
MCV: 91 fl (ref 78.0–100.0)
Platelets: 248 10*3/uL (ref 150.0–400.0)
RBC: 4.28 Mil/uL (ref 3.87–5.11)
RDW: 13.4 % (ref 11.5–15.5)
WBC: 8.1 10*3/uL (ref 4.0–10.5)

## 2019-05-28 LAB — LIPID PANEL
Cholesterol: 197 mg/dL (ref 0–200)
HDL: 54.4 mg/dL (ref >39.00)
LDL Cholesterol: 122 mg/dL — ABNORMAL HIGH (ref 0–99)
NonHDL: 142.55
Total CHOL/HDL Ratio: 4
Triglycerides: 103 mg/dL (ref 0.0–149.0)
VLDL: 20.6 mg/dL (ref 0.0–40.0)

## 2019-05-28 MED ORDER — ACETAMINOPHEN-CODEINE #3 300-30 MG PO TABS: 1.0000 | ORAL_TABLET | Freq: Three times a day (TID) | ORAL | 0 refills | Status: DC | PRN

## 2019-05-28 MED ORDER — GABAPENTIN 300 MG PO CAPS: 300.0000 mg | ORAL_CAPSULE | Freq: Every day | ORAL | 3 refills | Status: DC

## 2019-05-28 MED ORDER — TRAZODONE HCL 100 MG PO TABS: 100.0000 mg | ORAL_TABLET | Freq: Every day | ORAL | 3 refills | Status: DC | PRN

## 2019-05-28 NOTE — Patient Instructions (Signed)
Initiate gabapentin 300 mg nightly      Gabapentin capsules or tablets What is this medicine? GABAPENTIN (GA ba pen tin) is used to control seizures in certain types of epilepsy. It is also used to treat certain types of nerve pain. This medicine may be used for other purposes; ask your health care provider or pharmacist if you have questions. COMMON BRAND NAME(S): Active-PAC with Gabapentin, Gabarone, Neurontin What should I tell my health care provider before I take this medicine? They need to know if you have any of these conditions:  history of drug abuse or alcohol abuse problem  kidney disease  lung or breathing disease  suicidal thoughts, plans, or attempt; a previous suicide attempt by you or a family member  an unusual or allergic reaction to gabapentin, other medicines, foods, dyes, or preservatives  pregnant or trying to get pregnant  breast-feeding How should I use this medicine? Take this medicine by mouth with a glass of water. Follow the directions on the prescription label. You can take it with or without food. If it upsets your stomach, take it with food. Take your medicine at regular intervals. Do not take it more often than directed. Do not stop taking except on your doctor's advice. If you are directed to break the 600 or 800 mg tablets in half as part of your dose, the extra half tablet should be used for the next dose. If you have not used the extra half tablet within 28 days, it should be thrown away. A special MedGuide will be given to you by the pharmacist with each prescription and refill. Be sure to read this information carefully each time. Talk to your pediatrician regarding the use of this medicine in children. While this drug may be prescribed for children as young as 3 years for selected conditions, precautions do apply. Overdosage: If you think you have taken too much of this medicine contact a poison control center or emergency room at once. NOTE:  This medicine is only for you. Do not share this medicine with others. What if I miss a dose? If you miss a dose, take it as soon as you can. If it is almost time for your next dose, take only that dose. Do not take double or extra doses. What may interact with this medicine? This medicine may interact with the following medications:  alcohol  antihistamines for allergy, cough, and cold  certain medicines for anxiety or sleep  certain medicines for depression like amitriptyline, fluoxetine, sertraline  certain medicines for seizures like phenobarbital, primidone  certain medicines for stomach problems  general anesthetics like halothane, isoflurane, methoxyflurane, propofol  local anesthetics like lidocaine, pramoxine, tetracaine  medicines that relax muscles for surgery  narcotic medicines for pain  phenothiazines like chlorpromazine, mesoridazine, prochlorperazine, thioridazine This list may not describe all possible interactions. Give your health care provider a list of all the medicines, herbs, non-prescription drugs, or dietary supplements you use. Also tell them if you smoke, drink alcohol, or use illegal drugs. Some items may interact with your medicine. What should I watch for while using this medicine? Visit your doctor or health care provider for regular checks on your progress. You may want to keep a record at home of how you feel your condition is responding to treatment. You may want to share this information with your doctor or health care provider at each visit. You should contact your doctor or health care provider if your seizures get worse or if you  have any new types of seizures. Do not stop taking this medicine or any of your seizure medicines unless instructed by your doctor or health care provider. Stopping your medicine suddenly can increase your seizures or their severity. This medicine may cause serious skin reactions. They can happen weeks to months after  starting the medicine. Contact your health care provider right away if you notice fevers or flu-like symptoms with a rash. The rash may be red or purple and then turn into blisters or peeling of the skin. Or, you might notice a red rash with swelling of the face, lips or lymph nodes in your neck or under your arms. Wear a medical identification bracelet or chain if you are taking this medicine for seizures, and carry a card that lists all your medications. You may get drowsy, dizzy, or have blurred vision. Do not drive, use machinery, or do anything that needs mental alertness until you know how this medicine affects you. To reduce dizzy or fainting spells, do not sit or stand up quickly, especially if you are an older patient. Alcohol can increase drowsiness and dizziness. Avoid alcoholic drinks. Your mouth may get dry. Chewing sugarless gum or sucking hard candy, and drinking plenty of water will help. The use of this medicine may increase the chance of suicidal thoughts or actions. Pay special attention to how you are responding while on this medicine. Any worsening of mood, or thoughts of suicide or dying should be reported to your health care provider right away. Women who become pregnant while using this medicine may enroll in the [place] Pregnancy Registry by calling [phone]. This registry collects information about the safety of antiepileptic drug use during pregnancy. What side effects may I notice from receiving this medicine? Side effects that you should report to your doctor or health care professional as soon as possible:  allergic reactions like skin rash, itching or hives, swelling of the face, lips, or tongue  breathing problems  rash, fever, and swollen lymph nodes  redness, blistering, peeling or loosening of the skin, including inside the mouth  suicidal thoughts, mood changes Side effects that usually do not require medical attention (report to  your doctor or health care professional if they continue or are bothersome):  dizziness  drowsiness  headache  nausea, vomiting  swelling of ankles, feet, hands  tiredness This list may not describe all possible side effects. Call your doctor for medical advice about side effects. You may report side effects to FDA at 1-800-FDA-1088. Where should I keep my medicine? Keep out of reach of children. This medicine may cause accidental overdose and death if it taken by other adults, children, or pets. Mix any unused medicine with a substance like cat litter or coffee grounds. Then throw the medicine away in a sealed container like a sealed bag or a coffee can with a lid. Do not use the medicine after the expiration date. Store at room temperature between 15 and 30 degrees C (59 and 86 degrees F). NOTE: This sheet is a summary. It may not cover all possible information. If you have questions about this medicine, talk to your doctor, pharmacist, or health care provider.  2020 Elsevier/Gold Standard (2018-05-23 14:16:43)

## 2019-05-28 NOTE — Progress Notes (Signed)
[facility] [address] [place]. [place] [address] [phone]       FOLLOW UP VISIT NOTE  [name] Date of Birth:  1932-12-12 Medical Record Number:  [id number]   Referring MD: [facility] provider: Dr. Leonie Man  Reason for Referral: Neuropathy   Virtual Visit via Video Note  I connected with Tanya Harris on 05/28/19 at  7:45 AM EDT by a video enabled telemedicine application located at [facility] and verified that I am speaking with the correct person using two identifiers who was located at their own home.   I discussed the limitations of evaluation and management by telemedicine and the availability of in person appointments. The patient expressed understanding and agreed to proceed.   HPI:  [name] is a 86 year old female who is being seen today, 05/28/2019, via virtual visit for follow-up regarding neuropathy.  At prior visit, Dr. Leonie Man recommended increasing Topamax dosage to eventual 100 mg daily but patient unable to tolerate due to reports of increased "shakiness" and headaches therefore currently on 50 mg nightly.  Recommend trialing 50 mg twice daily but patient declines need of daytime use as symptoms are only present at night.  She denies benefit at current dosage.  Discussion at prior visit regarding initiation of pregabalin but she reports contacting her insurance company and will be expensive for her to trial.  She has not previously tried gabapentin.  Her gait and balance have been stable.  No further concerns at this time.     History: Provided for reference purposes only  Initial visit 10/27/2018 Dr. Phillips Climes. [name] is a 86 year old pleasant Caucasian lady seen today for initial office consultation visit for neuropathy.  History is obtained from the patient and her husband and review of referral notes.  Patient states that she has had bilateral tingling numbness in her feet for greater than 5 years while she  was living in [place] and then moved to [place].  Primary care physician told her that she had a peripheral neuropathy but she did not see a neurologist and did not have EMG nerve conduction studies done.  Over the years she feels the paresthesias seem to be getting worse they have more severe now and constant.  They seem to be more with.  Is having activity and at night.  Previously they used to be at the bottom of her feet but now they seem to have progressed to involve up to her cough.  She is also noticed that her balance is off she stumbles a lot and her too often catches when she is trying to walk fast.  She has had a few minor falls but none in the last 1 year.  She has been using a walker for the last 4 years but mostly due to degenerative arthritis in her back as well as still need left knee surgery.  She denies any history of bowel bladder dysfunction, severe back injury or fall.  She saw Dr. Nelva Bush for pain in the low back radiating down the left leg.  X-ray of the lumbar spine were obtained on 09/29/2018 which I personally reviewed which showed just osteopenia without any compression fracture.  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  She has not had any recent EMG nerve conduction study or MRI scan of the lumbar spine.  She has not had any lab work for reversible causes of neuropathy.  She denies history of diabetes, alcohol intake or exposure to  toxic medications which can cause neuropathy. Update 01/21/2019 Dr. Leonie Man : She returns for follow-up after last visit 200 months ago.  She states she is tolerating Topamax 50 mg at night quite well without side effects but is not so sure that it is helping at all.  She does take trazodone 100 mg which helps her sleep.  She is not as much bothered during the day with the paresthesias and does not take any medicine for that.  She does apply Aspercreme locally which helps only temporarily for couple of hours.  She  used to find some benefit with pool therapy but she is no longer able to do that because of knee pain.  She feels her back pain has improved and Dr. Alcide Evener feels she does not need vertebroplasty anymore.  Patient did undergo neuropathy panel labs at last visit which were all normal except elevated ANA but she does not have any clinical symptomatology to suggest lupus.  This is likely a lab false positive.  EMG nerve conduction study done on 12/11/2018 confirmed axonal peripheral polyneuropathy.  She continues to have paresthesias in the feet but feels her gait and balance are okay she has had no falls or injuries.  She does use a wheeled walker at all times.      ROS:   14 system review of systems is positive for leg pain, tingling, numbness, gait imbalance, back pain, anxiety and all other systems negative   PMH:  Past Medical History:  Diagnosis Date  . Buckle type impacted fracture of the distal left radial metaphysis 05/04/2014  . Closed nondisplaced fracture of styloid process of left ulna 05/04/2014  . Coccygeal contusion 05/02/2015  . COPD (chronic obstructive pulmonary disease) (HCC)    Mild  . DJD (degenerative joint disease) of knee   . Fall at home 05/04/2018  . Rotator cuff tendinitis, left 01/03/2018  . Scoliosis   . Spinal stenosis     Social History:  Social History   Socioeconomic History  . Marital status: Married    Spouse name: Not on file  . Number of children: Not on file  . Years of education: Not on file  . Highest education level: Not on file  Occupational History  . Not on file  Tobacco Use  . Smoking status: Never Smoker  . Smokeless tobacco: Never Used  Substance and Sexual Activity  . Alcohol use: No  . Drug use: Yes  . Sexual activity: Not on file  Other Topics Concern  . Not on file  Social History Narrative   Moved from [place] to [place] in 2015   Social Determinants of Health   Financial Resource Strain:   . Difficulty of Paying Living Expenses:     Food Insecurity:   . Worried About Charity fundraiser in the Last Year:   . Arboriculturist in the Last Year:   Transportation Needs:   . Film/video editor (Medical):   Marland Kitchen Lack of Transportation (Non-Medical):   Physical Activity:   . Days of Exercise per Week:   . Minutes of Exercise per Session:   Stress:   . Feeling of Stress :   Social Connections:   . Frequency of Communication with Friends and Family:   . Frequency of Social Gatherings with Friends and Family:   . Attends Religious Services:   . Active Member of Clubs or Organizations:   . Attends Archivist Meetings:   Marland Kitchen Marital Status:   Intimate  Partner Violence:   . Fear of Current or Ex-Partner:   . Emotionally Abused:   Marland Kitchen Physically Abused:   . Sexually Abused:     Medications:   Current Outpatient Medications on File Prior to Visit  Medication Sig Dispense Refill  . acetaminophen-codeine (TYLENOL #3) 300-30 MG tablet Take 1 tablet by mouth every 6 (six) hours as needed for moderate pain. 30 tablet 0  . aspirin 81 MG tablet Take 81 mg by mouth daily.    . Calcium Carbonate-Vitamin D (CALCIUM 600+D) 600-400 MG-UNIT per tablet Take 2 tablets by mouth daily.    . fluticasone (FLONASE) 50 MCG/ACT nasal spray Place 2 sprays into both nostrils daily. 16 g 11  . Fluticasone-Salmeterol (ADVAIR) 250-50 MCG/DOSE AEPB Inhale 1 puff into the lungs 2 (two) times daily. 60 each 11  . glucosamine-chondroitin 500-400 MG tablet Take 1 tablet by mouth daily.     . meloxicam (MOBIC) 15 MG tablet TAKE 1 TABLET(15 MG) BY MOUTH DAILY AS NEEDED FOR PAIN 90 tablet 1  . Multiple Vitamin (MULTIVITAMIN) tablet Take 1 tablet by mouth daily.    Marland Kitchen omega-3 fish oil (MAXEPA) 1000 MG CAPS capsule Take 2 capsules by mouth daily. TAKING 1200mg  DAILY    . OVER THE COUNTER MEDICATION Apply 1 application topically as needed (neuropathy/feet). OTC Pain relieving foot cream: Magnilife DB    . polyethylene glycol (MIRALAX / GLYCOLAX) packet  Take 17 g by mouth daily. 14 each 0  . polyvinyl alcohol (LIQUIFILM TEARS) 1.4 % ophthalmic solution Place 1 drop into both eyes as needed for dry eyes.    Marland Kitchen topiramate (TOPAMAX) 25 MG tablet Take 4 tablets (100 mg total) by mouth at bedtime. Start 1 tablet at bedtime x 1 week and then twice daily 180 tablet 0  . traZODone (DESYREL) 100 MG tablet Take 100 mg by mouth daily as needed.    . trolamine salicylate (ASPERCREME) 10 % cream Apply 1 application topically as needed for muscle pain (shoulder pain).     No current facility-administered medications on file prior to visit.    Allergies:   Allergies  Allergen Reactions  . Ibuprofen Swelling    Mouth Swelling- however pt confirms that she is able to take mobic with no problems 11/2016   . Amoxicillin Nausea And Vomiting    Did it involve swelling of the face/tongue/throat, SOB, or low BP? no Did it involve sudden or severe rash/hives, skin peeling, or any reaction on the inside of your mouth or nose? no Did you need to seek medical attention at a hospital or doctor's office? no  When did it last happen?2005 If all above answers are "NO", may proceed with cephalosporin use.     Physical Exam  General: well developed, well nourished, very pleasant elderly Caucasian female, seated, in no evident distress Head: head normocephalic and atraumatic.    Neurologic Exam Mental Status: Awake and fully alert. Oriented to place and time. Recent and remote memory intact. Attention span, concentration and fund of knowledge appropriate. Mood and affect appropriate.  Cranial Nerves: Extraocular movements full without nystagmus. Hearing intact to voice. Facial sensation intact. Face, tongue, palate moves normally and symmetrically.  Shoulder shrug symmetric. Motor: No evidence of weakness per drift assessment Coordination: Rapid alternating movements normal in all extremities. Finger-to-nose and heel-to-shin performed accurately bilaterally.       ASSESSMENT: 86 year old Caucasian lady with a longstanding history of bilateral lower extremity paresthesias and gait imbalance likely from longstanding peripheral neuropathy of undetermined  etiology.  Unable to tolerate increased dose of Topamax and lower dose without benefit     PLAN:  -Due to concern of insurance coverage on pregabalin, will trial use of gabapentin 300 mg nightly -Discontinue Topamax as unable to tolerate and lack of benefit -Advised to call with any difficulty tolerating gabapentin for possible need of increase -Advised ongoing use of RW AAT and importance of fall prevention  Follow-up in 3 months or call earlier if needed -patient requested to call back to schedule visit  I spent 21 minutes of non-face-to-face time with patient.  This included previsit chart review, lab review, study review, order entry, electronic health record documentation, patient education regarding trial of gabapentin and possible side effects.  All questions answered to patient satisfaction   Frann Rider, [facility]  [facility] [address] [place], [place] [address]  Phone [phone] Fax [phone] Note: This document was prepared with digital dictation and possible smart phrase technology. Any transcriptional errors that result from this process are unintentional.

## 2019-06-08 NOTE — Progress Notes (Signed)
I agree with the above plan 

## 2019-06-18 ENCOUNTER — Encounter: Payer: Self-pay | Admitting: Adult Health

## 2019-06-24 DIAGNOSIS — H04123 Dry eye syndrome of bilateral lacrimal glands: Secondary | ICD-10-CM | POA: Diagnosis not present

## 2019-06-24 DIAGNOSIS — H02825 Cysts of left lower eyelid: Secondary | ICD-10-CM | POA: Diagnosis not present

## 2019-06-24 DIAGNOSIS — Z961 Presence of intraocular lens: Secondary | ICD-10-CM | POA: Diagnosis not present

## 2019-06-24 DIAGNOSIS — H02824 Cysts of left upper eyelid: Secondary | ICD-10-CM | POA: Diagnosis not present

## 2019-06-24 DIAGNOSIS — H524 Presbyopia: Secondary | ICD-10-CM | POA: Diagnosis not present

## 2019-06-24 DIAGNOSIS — H52223 Regular astigmatism, bilateral: Secondary | ICD-10-CM | POA: Diagnosis not present

## 2019-07-01 DIAGNOSIS — H04123 Dry eye syndrome of bilateral lacrimal glands: Secondary | ICD-10-CM | POA: Diagnosis not present

## 2019-07-08 ENCOUNTER — Telehealth: Payer: Self-pay | Admitting: Family Medicine

## 2019-07-08 NOTE — Progress Notes (Signed)
  Chronic Care Management   Note  07/08/2019 Name: Cristina Koscinski MRN: [id number] DOB: [birth year]  Tanya Harris is a 86 y.o. year old female who is a primary care patient of Copland, Gay Filler, MD. I reached out to [name] by phone today in response to a referral sent by [name]'s PCP, Copland, Gay Filler, MD.   [name] was given information about Chronic Care Management services today including:  1. CCM service includes personalized support from designated clinical staff supervised by her physician, including individualized plan of care and coordination with other care providers 2. 24/7 contact phone numbers for assistance for urgent and routine care needs. 3. Service will only be billed when office clinical staff spend 20 minutes or more in a month to coordinate care. 4. Only one practitioner may furnish and bill the service in a calendar month. 5. The patient may stop CCM services at any time (effective at the end of the month) by phone call to the office staff.   Patient agreed to services and verbal consent obtained.    This note is not being shared with the patient for the following reason: To respect privacy (The patient or proxy has requested that the information not be shared).  Follow up plan:   Raynicia Dukes [facility] Scheduler

## 2019-07-15 ENCOUNTER — Encounter: Payer: Self-pay | Admitting: Family Medicine

## 2019-07-15 NOTE — Telephone Encounter (Signed)
Tanya Harris!   Please let Tanya Harris know this visit will be to discuss all of her medications making sure they are all appropriate (there is a reason why she needs each medication), effective (they work for the reason she takes each medication), safe (there are no side effects or drug interactions with the medicines she is taking), and that they are accessible (she can afford the medications she is prescribed).   This visit also includes the following:  -Discussing which medications have worked in the past and which have not.  -Reviewing all labs associated with the medications she is taking to make sure they are up to date -Answering any questions the patient has about any of her medications.    I really enjoy helping patients make sure that medications are not an extra burden in their life. Happy to help any way that I can, and I look forward to meeting with Tanya Harris on 08/27/19!

## 2019-07-15 NOTE — Telephone Encounter (Signed)
Tanya Kenner do you mind writing back to her and explaining to her what you will do?

## 2019-08-20 ENCOUNTER — Telehealth: Payer: Self-pay | Admitting: Adult Health

## 2019-08-20 NOTE — Telephone Encounter (Signed)
Pt gave consent for insurance to be filed for vv  Pt understands that although there may be some limitations with this type of visit, we will take all precautions to reduce any security or privacy concerns.  Pt understands that this will be treated like an in office visit and we will file with pt's insurance, and there may be a patient responsible charge related to this service.  

## 2019-08-20 NOTE — Telephone Encounter (Signed)
Noted  

## 2019-08-26 NOTE — Chronic Care Management (AMB) (Deleted)
Chronic Care Management Pharmacy  Name: Tanya Harris  MRN: [id number] DOB: 1932/12/18   Chief Complaint/ HPI  Tanya Harris,  86 y.o. , female presents for their Initial CCM visit with the clinical pharmacist via telephone due to COVID-19 Pandemic.  PCP : Tanya Mclean, MD  Their chronic conditions include: Allergic rhinitis, Hyperlipidemia, COPD and Osteopenia   Office Visits: 05/28/19: Patient presented to Dr. Lorelei Harris for follow-up. DEXA scan ordered. Cautioned on oversedation from pain/insomnia medicines.  Glucosamine-Chondroitin and aspercreme 10% cream stopped (patient no longer taking)  Consult Visit: 05/28/19: Patient presented to Tanya Rider, NP (Neurology) for neuropathy follow-up. topiramate discontinued (ineffective), gabapentin 300 mg QHS started.   Allergies  Allergen Reactions  . Ibuprofen Swelling    Mouth Swelling- however pt confirms that she is able to take mobic with no problems 11/2016   . Amoxicillin Nausea And Vomiting    Did it involve swelling of the face/tongue/throat, SOB, or low BP? no Did it involve sudden or severe rash/hives, skin peeling, or any reaction on the inside of your mouth or nose? no Did you need to seek medical attention at a hospital or doctor's office? no  When did it last happen?2005 If all above answers are "NO", may proceed with cephalosporin use.     Medications: Outpatient Encounter Medications as of 08/27/2019  Medication Sig  . acetaminophen-codeine (TYLENOL #3) 300-30 MG tablet Take 1 tablet by mouth every 8 (eight) hours as needed for moderate pain. For left knee pain  . aspirin 81 MG tablet Take 81 mg by mouth daily.  . Calcium Carbonate-Vitamin D (CALCIUM 600+D) 600-400 MG-UNIT per tablet Take 2 tablets by mouth daily.  . fluticasone (FLONASE) 50 MCG/ACT nasal spray Place 2 sprays into both nostrils daily.  . Fluticasone-Salmeterol (ADVAIR) 250-50 MCG/DOSE AEPB Inhale 1 puff into the lungs 2 (two) times  daily.  Marland Kitchen gabapentin (NEURONTIN) 300 MG capsule Take 1 capsule (300 mg total) by mouth at bedtime.  . meloxicam (MOBIC) 15 MG tablet TAKE 1 TABLET(15 MG) BY MOUTH DAILY AS NEEDED FOR PAIN  . Multiple Vitamin (MULTIVITAMIN) tablet Take 1 tablet by mouth daily.  Marland Kitchen omega-3 fish oil (MAXEPA) 1000 MG CAPS capsule Take 2 capsules by mouth daily. TAKING 1265m DAILY  . OVER THE COUNTER MEDICATION Apply 1 application topically as needed (neuropathy/feet). OTC Pain relieving foot cream: Magnilife DB  . polyethylene glycol (MIRALAX / GLYCOLAX) packet Take 17 g by mouth daily.  . polyvinyl alcohol (LIQUIFILM TEARS) 1.4 % ophthalmic solution Place 1 drop into both eyes as needed for dry eyes.  . traZODone (DESYREL) 100 MG tablet Take 1 tablet (100 mg total) by mouth daily as needed. Use for insomnia   No facility-administered encounter medications on file as of 08/27/2019.     Current Diagnosis/Assessment:    Goals Addressed   None     COPD   Last spirometry score: n/a  Gold Grade: {CHL HP [facility] COPD Gold GKQASU:[id number]}Current COPD Classification:  {CHL HP [facility] COPD [phone]}  Eosinophil count:   Lab Results  Component Value Date/Time   EOSPCT 0 05/04/2018 04:00 PM  %                               Eos (Absolute):  Lab Results  Component Value Date/Time   EOSABS 0.0 05/04/2018 04:00 PM    Tobacco Status:  Social History   Tobacco Use  Smoking Status Never  Smoker  Smokeless Tobacco Never Used    Patient has failed these meds in past: n/a Patient is currently {CHL Controlled/Uncontrolled:[phone]} on the following medications:  Advair 250-50 mcg/dose 1 puff BID   Using maintenance inhaler regularly? {yes/no:20286} Frequency of rescue inhaler use:  {CHL [facility] Inhaler ACZY:6063016010}  We discussed:  {CHL HP [facility] Pharmacy discussion:[phone]}  Plan  Continue {CHL HP [facility]  [phone]}  Hyperlipidemia   LDL goal < ***  Lipid Panel     Component Value Date/Time   CHOL 197 05/28/2019 0952   TRIG 103.0 05/28/2019 0952   HDL 54.40 05/28/2019 0952   LDLCALC 122 (H) 05/28/2019 0952    Hepatic Function Latest Ref Rng & Units 05/28/2019 10/27/2018 05/04/2018  Total Protein 6.0 - 8.3 g/dL 7.0 7.6 7.3  Albumin 3.5 - 5.2 g/dL 3.9 - 4.0  AST 0 - 37 U/L 21 - 28  ALT 0 - 35 U/L 13 - 16  Alk Phosphatase 39 - 117 U/L 75 - 81  Total Bilirubin 0.2 - 1.2 mg/dL 0.4 - 0.8  Bilirubin, Direct 0.0 - 0.3 mg/dL - - -   CMP Latest Ref Rng & Units 05/28/2019 10/27/2018 05/07/2018  Glucose 70 - 99 mg/dL 129(H) - 112(H)  BUN 6 - 23 mg/dL 23 - 14  Creatinine 0.40 - 1.20 mg/dL 0.62 - 0.34(L)  Sodium 135 - 145 mEq/L 136 - 133(L)  Potassium 3.5 - 5.1 mEq/L 3.8 - 3.4(L)  Chloride 96 - 112 mEq/L 101 - 103  CO2 19 - 32 mEq/L 29 - 25  Calcium 8.4 - 10.5 mg/dL 9.6 - 8.2(L)  Total Protein 6.0 - 8.3 g/dL 7.0 7.6 -  Total Bilirubin 0.2 - 1.2 mg/dL 0.4 - -  Alkaline Phos 39 - 117 U/L 75 - -  AST 0 - 37 U/L 21 - -  ALT 0 - 35 U/L 13 - -   The ASCVD Risk score Mikey Bussing DC Jr., et al., 2013) failed to calculate for the following reasons:   The 2013 ASCVD risk score is only valid for ages 6 to 5    Patient has failed these meds in past: n/a Patient is currently {CHL Controlled/Uncontrolled:[phone]} on the following medications:  . Omega 3 Fish Oil 1200 mg daily   . Aspirin 81 mg daily We discussed:  {CHL [facility] Pharmacy discussion:[phone]}  Plan  Continue {CHL HP [facility] Pharmacy [phone]}  Osteopenia   Last DEXA Scan: 12/18/16    T-Score femoral neck: -2.0  T-Score total hip: -2.0  T-Score lumbar spine: n/a  T-Score forearm radius: -1.9  10-year probability of major osteoporotic fracture: 15.1%  10-year probability of hip fracture: 2.5%  Vit D, 25-Hydroxy  Date Value Ref Range Status  10/27/2018 40.7 30.0 - 100.0 ng/mL Final    Comment:    Vitamin D  deficiency has been defined by the Institute of Medicine and an Endocrine Society practice guideline as a level of serum 25-OH vitamin D less than 20 ng/mL (1,2). The Endocrine Society went on to further define vitamin D insufficiency as a level between 21 and 29 ng/mL (2). 1. IOM (Institute of Medicine). 2010. Dietary reference    intakes for calcium and D. [place]: The    [employer]. 2. Holick MF, Binkley [place], Bischoff-Ferrari HA, et al.    Evaluation, treatment, and prevention of vitamin D    deficiency: an Endocrine Society clinical practice    guideline. JCEM. 2011 Jul; 96(7):1911-30.      Patient {  is;is not an osteoporosis candidate:23886}  Patient has failed these meds in past: *** Patient is currently {CHL Controlled/Uncontrolled:[phone]} on the following medications:  Calcium Carbonate-Vitamin D 628m-400u 2 tab daily   We discussed:  {Osteoporosis Counseling:23892}  Plan  Continue {CHL [facility] Pharmacy Plans:[phone]}  Allergic Rhinitis   Patient has failed these meds in past: *** Patient is currently {CHL Controlled/Uncontrolled:[phone]} on the following medications:  . Flonase Nasal spray 50 mcg/act 2 spray daily   We discussed:  ***  Plan  Continue {CHL HP [facility] Pharmacy Plans:[phone]}  Chronic Pain   Axonal Neuropathy in feet Knee Pain (TKR 2001)  Patient has failed these meds in past: *** Patient is currently {CHL Controlled/Uncontrolled:[phone]} on the following medications:  . Gabapentin 300 mg QHS . APAP-codeine 300-30 mg q8hr PRN . Meloxicam 15 mg daily PRN . Magnilife DB topical cream  We discussed:  ***  Plan  Continue {CHL HP [facility] Pharmacy Plans:[phone]}  Misc / OTC   Patient has failed these meds in past: *** Patient is currently {CHL Controlled/Uncontrolled:[phone]} on the following medications:    .Marland KitchenMultivitamin daily  . Miralax 17 g daily  . Liquifilm tears 1.4% ophth 1 drop both  eyes PRN  . Trazodone 100 mg daily PRN- insomnia   We discussed:  ***  Plan  Continue {CHL HP [facility] PBOERQ:[id number]} Vaccines   Reviewed and discussed patient's vaccination history.    Immunization History  Administered Date(s) Administered  . Fluad Quad(high Dose 65+) 11/26/2018  . Influenza, High Dose Seasonal PF 11/30/2013, 12/17/2016, 12/09/2017  . Influenza,inj,Quad PF,6+ Mos 12/12/2015  . PFIZER SARS-COV-2 Vaccination 04/12/2019, 05/06/2019  . Pneumococcal Conjugate-13 06/01/2014  . Pneumococcal Polysaccharide-23 12/12/2015  . Zoster 03/05/2010    Plan  Recommended patient receive *** vaccine in *** office/pharmacy.   Medication Management   [facility] medications Uses pill box? {Yes or If no, why not?:20788} Pt endorses ***% compliance  We discussed: ***  Plan  {US Pharmacy PITJL:59747}   Follow up: *** month phone visit  ***

## 2019-08-27 ENCOUNTER — Ambulatory Visit: Payer: Medicare Other | Admitting: Pharmacist

## 2019-08-27 ENCOUNTER — Other Ambulatory Visit: Payer: Self-pay | Admitting: Family Medicine

## 2019-08-27 ENCOUNTER — Other Ambulatory Visit: Payer: Self-pay

## 2019-08-27 DIAGNOSIS — J449 Chronic obstructive pulmonary disease, unspecified: Secondary | ICD-10-CM

## 2019-08-27 DIAGNOSIS — J302 Other seasonal allergic rhinitis: Secondary | ICD-10-CM

## 2019-08-27 DIAGNOSIS — T7840XD Allergy, unspecified, subsequent encounter: Secondary | ICD-10-CM

## 2019-08-27 DIAGNOSIS — M8580 Other specified disorders of bone density and structure, unspecified site: Secondary | ICD-10-CM

## 2019-08-27 DIAGNOSIS — E785 Hyperlipidemia, unspecified: Secondary | ICD-10-CM

## 2019-08-27 NOTE — Patient Instructions (Signed)
Visit Information  Goals Addressed            This Visit's Progress   . Chronic Care Management Pharmacy Care Plan       CARE PLAN ENTRY (see longitudinal plan of care for additional care plan information)  Current Barriers:  . Chronic Disease Management support, education, and care coordination needs related to COPD, Hyperlipidemia, Osteopenia, Allergic Rhinitis, Chronic Pain   Hyperlipidemia Lab Results  Component Value Date/Time   LDLCALC 122 (H) 05/28/2019 09:52 AM   . Pharmacist Clinical Goal(s): o Over the next 180 days, patient will work with PharmD and providers to achieve LDL goal < 100 or prevention of upward trend . Current regimen:  . Omega 3 Fish Oil 1000 mg daily   . Aspirin 81 mg daily . Interventions: o Discussed the importance of diet and exercise to reduce LDL . Patient self care activities - Over the next 180 days, patient will: o Maintain cholesterol medication regimen.   Allergies . Pharmacist Clinical Goal(s) o Over the next 90 days, patient will work with PharmD and providers to reduce symptoms of allergies . Current regimen:  . Flonase Nasal spray 50 mcg/act 2 spray daily  . Diphenhydramine 25mg  as needed . Interventions: o Recommended patient to replace diphenhydramine with loratadine or cetirizine o Collaboration with provider regarding medication management (referral to allergist) . Patient self care activities - Over the next 90 days, patient will: o Replace diphenhydramine with loratadine or cetirizine  Osteopenia . Pharmacist Clinical Goal(s) o Over the next 90 days, patient will work with PharmD and providers to reduce risk of fracture due to osteopenia . Current regimen:  o Calcium Carbonate-Vitamin D 600mg -800u 1 tab daily  o 2 servings of dairy daily  . Interventions: o Recommended patient to schedule visit for DEXA . Patient self care activities - Over the next 90 days, patient will: o Schedule visit for DEXA  Health Maintenance    . Pharmacist Clinical Goal(s) o Over the next 90 days, patient will work with PharmD and providers to complete health maintenance screenings/vaccinations . Interventions: o Recommended patient receive Shingrix vaccine in pharmacy.  o Recommended patient receive Td booster vaccine in office if covered by insurance . Patient self care activities - Over the next 90 days, patient will: o Receive Shingrix vaccine in pharmacy.  o Receive Td booster vaccine in office if covered by insurance  Medication management . Pharmacist Clinical Goal(s): o Over the next 90 days, patient will work with PharmD and providers to maintain optimal medication adherence . Current pharmacy: [facility] . Interventions o Comprehensive medication review performed. o Continue current medication management strategy . Patient self care activities - Over the next 90 days, patient will: o Focus on medication adherence by filling and taking medications appropriately  o Take medications as prescribed o Report any questions or concerns to PharmD and/or provider(s)  Initial goal documentation        Tanya Harris was given information about Chronic Care Management services today including:  1. CCM service includes personalized support from designated clinical staff supervised by her physician, including individualized plan of care and coordination with other care providers 2. 24/7 contact phone numbers for assistance for urgent and routine care needs. 3. Standard insurance, coinsurance, copays and deductibles apply for chronic care management only during months in which we provide at least 20 minutes of these services. Most insurances cover these services at 100%, however patients may be responsible for any copay, coinsurance and/or deductible if  applicable. This service may help you avoid the need for more expensive face-to-face services. 4. Only one practitioner may furnish and bill the service in a calendar month. 5. The  patient may stop CCM services at any time (effective at the end of the month) by phone call to the office staff.  Patient agreed to services and verbal consent obtained.   The patient verbalized understanding of instructions provided today and agreed to receive a mailed copy of patient instruction and/or educational materials. Telephone follow up appointment with pharmacy team member scheduled for: 09/23/19  Melvenia Beam Ghazi Rumpf, PharmD Clinical Pharmacist [facility] [phone]    Cholesterol Content in Foods Cholesterol is a waxy, fat-like substance that helps to carry fat in the blood. The body needs cholesterol in small amounts, but too much cholesterol can cause damage to the arteries and heart. Most people should eat less than 200 milligrams (mg) of cholesterol a [name]. Foods with cholesterol  Cholesterol is found in animal-based foods, such as meat, seafood, and dairy. Generally, low-fat dairy and lean meats have less cholesterol than full-fat dairy and fatty meats. The milligrams of cholesterol per serving (mg per serving) of common cholesterol-containing foods are listed below. Meat and other proteins  Egg -- one large whole egg has 186 mg.  Veal shank -- 4 oz has 141 mg.  Lean ground [place] (93% lean) -- 4 oz has 118 mg.  Fat-trimmed lamb loin -- 4 oz has 106 mg.  Lean ground beef (90% lean) -- 4 oz has 100 mg.  Lobster -- 3.5 oz has 90 mg.  Pork loin chops -- 4 oz has 86 mg.  Canned salmon -- 3.5 oz has 83 mg.  Fat-trimmed beef top loin -- 4 oz has 78 mg.  Frankfurter -- 1 frank (3.5 oz) has 77 mg.  Crab -- 3.5 oz has 71 mg.  Roasted chicken without skin, white meat -- 4 oz has 66 mg.  Light bologna -- 2 oz has 45 mg.  Deli-cut [place] -- 2 oz has 31 mg.  Canned tuna -- 3.5 oz has 31 mg.  [name] -- 1 oz has 29 mg.  Oysters and mussels (raw) -- 3.5 oz has 25 mg.  Mackerel -- 1 oz has 22 mg.  Trout -- 1 oz has 20 mg.  Pork  sausage -- 1 link (1 oz) has 17 mg.  Salmon -- 1 oz has 16 mg.  Tilapia -- 1 oz has 14 mg. Dairy  Soft-serve ice cream --  cup (4 oz) has 103 mg.  Whole-milk yogurt -- 1 cup (8 oz) has 29 mg.  Cheddar cheese -- 1 oz has 28 mg.  American cheese -- 1 oz has 28 mg.  Whole milk -- 1 cup (8 oz) has 23 mg.  2% milk -- 1 cup (8 oz) has 18 mg.  Cream cheese -- 1 tablespoon (Tbsp) has 15 mg.  Cottage cheese --  cup (4 oz) has 14 mg.  Low-fat (1%) milk -- 1 cup (8 oz) has 10 mg.  Sour cream -- 1 Tbsp has 8.5 mg.  Low-fat yogurt -- 1 cup (8 oz) has 8 mg.  Nonfat Greek yogurt -- 1 cup (8 oz) has 7 mg.  Half-and-half cream -- 1 Tbsp has 5 mg. Fats and oils  Cod liver oil -- 1 tablespoon (Tbsp) has 82 mg.  Butter -- 1 Tbsp has 15 mg.  Lard -- 1 Tbsp has 14 mg.  Bacon grease -- 1 Tbsp  has 14 mg.  Mayonnaise -- 1 Tbsp has 5-10 mg.  Margarine -- 1 Tbsp has 3-10 mg. Exact amounts of cholesterol in these foods may vary depending on specific ingredients and brands. Foods without cholesterol Most plant-based foods do not have cholesterol unless you combine them with a food that has cholesterol. Foods without cholesterol include:  Grains and cereals.  Vegetables.  Fruits.  Vegetable oils, such as olive, canola, and sunflower oil.  Legumes, such as peas, beans, and lentils.  Nuts and seeds.  Egg whites. Summary  The body needs cholesterol in small amounts, but too much cholesterol can cause damage to the arteries and heart.  Most people should eat less than 200 milligrams (mg) of cholesterol a Addaline Peplinski. This information is not intended to replace advice given to you by your health care provider. Make sure you discuss any questions you have with your health care provider. Document Revised: 02/01/2017 Document Reviewed: 10/16/2016 Elsevier Patient Education  [place].

## 2019-08-27 NOTE — Chronic Care Management (AMB) (Signed)
Chronic Care Management Pharmacy  Name: Tanya Harris  MRN: [id number] DOB: 1932/10/19   Chief Complaint/ HPI  Tanya Harris,  86 y.o. , female presents for their Initial CCM visit with the clinical pharmacist via telephone due to COVID-19 Pandemic.  PCP : Darreld Mclean, MD  Their chronic conditions include: COPD, Hyperlipidemia, Osteopenia, Allergic Rhinitis, Chronic Pain  Office Visits: 05/28/19: Patient presented to Dr. Lorelei Pont for follow-up. DEXA scan ordered. Cautioned on oversedation from pain/insomnia medicines.  Glucosamine-Chondroitin and aspercreme 10% cream stopped (patient no longer taking)  Consult Visit: 05/28/19: Patient presented to Frann Rider, NP (Neurology) for neuropathy follow-up. topiramate discontinued (ineffective), gabapentin 300 mg QHS started.   Allergies  Allergen Reactions  . Ibuprofen Swelling    Mouth Swelling- however pt confirms that she is able to take mobic with no problems 11/2016   . Amoxicillin Nausea And Vomiting    Did it involve swelling of the face/tongue/throat, SOB, or low BP? no Did it involve sudden or severe rash/hives, skin peeling, or any reaction on the inside of your mouth or nose? no Did you need to seek medical attention at a hospital or doctor's office? no  When did it last happen?2005 If all above answers are "NO", may proceed with cephalosporin use.     Medications: Outpatient Encounter Medications as of 08/27/2019  Medication Sig Note  . aspirin 81 MG tablet Take 81 mg by mouth daily.   . Calcium Carbonate-Vitamin D (CALCIUM 600+D) 600-400 MG-UNIT per tablet Take 2 tablets by mouth daily. 800 units vitamin D 08/27/2019: Takes one tablet daily  . fluticasone (FLONASE) 50 MCG/ACT nasal spray Place 2 sprays into both nostrils daily.   . Fluticasone-Salmeterol (ADVAIR) 250-50 MCG/DOSE AEPB Inhale 1 puff into the lungs 2 (two) times daily.   Marland Kitchen gabapentin (NEURONTIN) 300 MG capsule Take 1 capsule (300 mg total) by  mouth at bedtime.   . meloxicam (MOBIC) 15 MG tablet TAKE 1 TABLET(15 MG) BY MOUTH DAILY AS NEEDED FOR PAIN   . Multiple Vitamin (MULTIVITAMIN) tablet Take 1 tablet by mouth daily.   Marland Kitchen omega-3 fish oil (MAXEPA) 1000 MG CAPS capsule Take 1 capsule by mouth daily.    . polyethylene glycol (MIRALAX / GLYCOLAX) packet Take 17 g by mouth daily.   . polyvinyl alcohol (LIQUIFILM TEARS) 1.4 % ophthalmic solution Place 1 drop into both eyes as needed for dry eyes.   . traZODone (DESYREL) 100 MG tablet Take 1 tablet (100 mg total) by mouth daily as needed. Use for insomnia   . acetaminophen-codeine (TYLENOL #3) 300-30 MG tablet Take 1 tablet by mouth every 8 (eight) hours as needed for moderate pain. For left knee pain (Patient not taking: Reported on 08/27/2019)   . OVER THE COUNTER MEDICATION Apply 1 application topically as needed (neuropathy/feet). OTC Pain relieving foot cream: Magnilife DB (Patient not taking: Reported on 08/27/2019)    No facility-administered encounter medications on file as of 08/27/2019.   SDOH Screenings   Alcohol Screen:   . Last Alcohol Screening Score (AUDIT):   Depression (PHQ2-9):   . PHQ-2 Score:   Financial Resource Strain:   . Difficulty of Paying Living Expenses:   Food Insecurity:   . Worried About Charity fundraiser in the Last Year:   . [place] in the Last Year:   Housing:   . Last Housing Risk Score:   Physical Activity:   . Days of Exercise per Week:   . Minutes of Exercise per  Session:   Social Connections:   . Frequency of Communication with Friends and Family:   . Frequency of Social Gatherings with Friends and Family:   . Attends Religious Services:   . Active Member of Clubs or Organizations:   . Attends Archivist Meetings:   Marland Kitchen Marital Status:   Stress:   . Feeling of Stress :   Tobacco Use: Low Risk   . Smoking Tobacco Use: Never Smoker  . Smokeless Tobacco Use: Never Used  Transportation Needs:   . Film/video editor  (Medical):   Marland Kitchen Lack of Transportation (Non-Medical):      Current Diagnosis/Assessment:  Goals Addressed            This Visit's Progress   . Chronic Care Management Pharmacy Care Plan       CARE PLAN ENTRY (see longitudinal plan of care for additional care plan information)  Current Barriers:  . Chronic Disease Management support, education, and care coordination needs related to COPD, Hyperlipidemia, Osteopenia, Allergic Rhinitis, Chronic Pain   Hyperlipidemia Lab Results  Component Value Date/Time   LDLCALC 122 (H) 05/28/2019 09:52 AM   . Pharmacist Clinical Goal(s): o Over the next 180 days, patient will work with PharmD and providers to achieve LDL goal < 100 or prevention of upward trend . Current regimen:  . Omega 3 Fish Oil 1000 mg daily   . Aspirin 81 mg daily . Interventions: o Discussed the importance of diet and exercise to reduce LDL . Patient self care activities - Over the next 180 days, patient will: o Maintain cholesterol medication regimen.   Allergies . Pharmacist Clinical Goal(s) o Over the next 90 days, patient will work with PharmD and providers to reduce symptoms of allergies . Current regimen:  . Flonase Nasal spray 50 mcg/act 2 spray daily  . Diphenhydramine 54m as needed . Interventions: o Recommended patient to replace diphenhydramine with loratadine or cetirizine o Collaboration with provider regarding medication management (referral to allergist) . Patient self care activities - Over the next 90 days, patient will: o Replace diphenhydramine with loratadine or cetirizine  Osteopenia . Pharmacist Clinical Goal(s) o Over the next 90 days, patient will work with PharmD and providers to reduce risk of fracture due to osteopenia . Current regimen:  o Calcium Carbonate-Vitamin D 6028m800u 1 tab daily  o 2 servings of dairy daily  . Interventions: o Recommended patient to schedule visit for DEXA . Patient self care activities - Over the  next 90 days, patient will: o Schedule visit for DEXA  Health Maintenance  . Pharmacist Clinical Goal(s) o Over the next 90 days, patient will work with PharmD and providers to complete health maintenance screenings/vaccinations . Interventions: o Recommended patient receive Shingrix vaccine in pharmacy.  o Recommended patient receive Td booster vaccine in office if covered by insurance . Patient self care activities - Over the next 90 days, patient will: o Receive Shingrix vaccine in pharmacy.  o Receive Td booster vaccine in office if covered by insurance  Medication management . Pharmacist Clinical Goal(s): o Over the next 90 days, patient will work with PharmD and providers to maintain optimal medication adherence . Current pharmacy: [facility] . Interventions o Comprehensive medication review performed. o Continue current medication management strategy . Patient self care activities - Over the next 90 days, patient will: o Focus on medication adherence by filling and taking medications appropriately  o Take medications as prescribed o Report any questions or concerns to PharmD and/or provider(s)  Initial goal documentation      Social Hx:  Moved here from [place] to be closer to their kids.  Moved here 6 years ago. Only community outside of children is church, but this has been put on hold due to [place]. Married 65 years.  Has 2 sons and 2 granddaughters.   COPD   Last spirometry score: n/a  Gold Grade: Unable to assess Current COPD Classification:  A (low sx, <2 exacerbations/yr)  Eosinophil count:   Lab Results  Component Value Date/Time   EOSPCT 0 05/04/2018 04:00 PM  %                               Eos (Absolute):  Lab Results  Component Value Date/Time   EOSABS 0.0 05/04/2018 04:00 PM    Tobacco Status:  Social History   Tobacco Use  Smoking Status Never Smoker  Smokeless Tobacco Never Used    Patient has failed these meds in past: n/a Patient is  currently controlled on the following medications:  Advair 250-50 mcg/dose 1 puff BID   Last used Advair 2 months ago Uses Advair when she starts "coughing stuff up" Last used albuterol in 2001, but has not had to use it since  When she starts "coughing stuff up" she uses advair, flonase, and nasal saline. This resolves her symptoms.  Using maintenance inhaler regularly? No Frequency of rescue inhaler use:  never  Plan -Continue current medications  Hyperlipidemia   LDL goal < 100  Lipid Panel     Component Value Date/Time   CHOL 197 05/28/2019 0952   TRIG 103.0 05/28/2019 0952   HDL 54.40 05/28/2019 0952   LDLCALC 122 (H) 05/28/2019 0952    Hepatic Function Latest Ref Rng & Units 05/28/2019 10/27/2018 05/04/2018  Total Protein 6.0 - 8.3 g/dL 7.0 7.6 7.3  Albumin 3.5 - 5.2 g/dL 3.9 - 4.0  AST 0 - 37 U/L 21 - 28  ALT 0 - 35 U/L 13 - 16  Alk Phosphatase 39 - 117 U/L 75 - 81  Total Bilirubin 0.2 - 1.2 mg/dL 0.4 - 0.8  Bilirubin, Direct 0.0 - 0.3 mg/dL - - -   CMP Latest Ref Rng & Units 05/28/2019 10/27/2018 05/07/2018  Glucose 70 - 99 mg/dL 129(H) - 112(H)  BUN 6 - 23 mg/dL 23 - 14  Creatinine 0.40 - 1.20 mg/dL 0.62 - 0.34(L)  Sodium 135 - 145 mEq/L 136 - 133(L)  Potassium 3.5 - 5.1 mEq/L 3.8 - 3.4(L)  Chloride 96 - 112 mEq/L 101 - 103  CO2 19 - 32 mEq/L 29 - 25  Calcium 8.4 - 10.5 mg/dL 9.6 - 8.2(L)  Total Protein 6.0 - 8.3 g/dL 7.0 7.6 -  Total Bilirubin 0.2 - 1.2 mg/dL 0.4 - -  Alkaline Phos 39 - 117 U/L 75 - -  AST 0 - 37 U/L 21 - -  ALT 0 - 35 U/L 13 - -   The ASCVD Risk score Mikey Bussing DC Jr., et al., 2013) failed to calculate for the following reasons:   The 2013 ASCVD risk score is only valid for ages 18 to 65    Patient has failed these meds in past: n/a Patient is currently uncontrolled, but trending down on the following medications:  . Omega 3 Fish Oil 1000 mg daily   . Aspirin 81 mg daily  We discussed:  LDL goal and ways to reduce LDL through non-pharm.  Noting patient's  age would not recommend starting statin for primary prevention  Plan -Continue current medications  Osteopenia   Last DEXA Scan: 12/18/16    T-Score femoral neck: -2.0  T-Score total hip: -2.0  T-Score lumbar spine: n/a  T-Score forearm radius: -1.9  10-year probability of major osteoporotic fracture: 15.1%  10-year probability of hip fracture: 2.5%  Vit D, 25-Hydroxy  Date Value Ref Range Status  10/27/2018 40.7 30.0 - 100.0 ng/mL Final    Comment:    Vitamin D deficiency has been defined by the Institute of Medicine and an Endocrine Society practice guideline as a level of serum 25-OH vitamin D less than 20 ng/mL (1,2). The Endocrine Society went on to further define vitamin D insufficiency as a level between 21 and 29 ng/mL (2). 1. IOM (Institute of Medicine). 2010. Dietary reference    intakes for calcium and D. [place]: The    [employer]. 2. Holick MF, Binkley [place], Bischoff-Ferrari HA, et al.    Evaluation, treatment, and prevention of vitamin D    deficiency: an Endocrine Society clinical practice    guideline. JCEM. 2011 Jul; 96(7):1911-30.      Patient is not a candidate for pharmacologic treatment  Patient has failed these meds in past: None noted Patient is currently controlled on the following medications:  Calcium Carbonate-Vitamin D 657m-800u 1 tab daily   Two servings of dairy daily total ~6053mof calcium to total 120051mf calcium daily  She remembers taking fosamax in the past for years, but does not recall when she stopped taking it.  Has a DEXA scan ordered. Reminded patient to get schedule for this to continue to assess her bone health  We discussed:  Recommend [id number] units of vitamin D daily. Recommend 1200 mg of calcium daily from dietary and supplemental sources.  Plan -Schedule DEXA -Continue current medications  Allergic Rhinitis   Patient has failed these meds in past: None noted  Patient is  currently stable on the following medications:  . Flonase Nasal spray 50 mcg/act 2 spray daily  . Diphenhydramine 23m56m needed  Does report she feels sleepy after taking the diphenhydramine in the morning  Recommended loratadine (Claritin) or cetirizine (Zyrtec) to replace diphenhydramine noting other sedating drugs such as trazodone and gabapentin in patient's medication regimen.   She would like to get a referral to an allergy doctor to get allergy shots as she felt they were helpful in the past. Will coordinate with Dr. CoplLorelei Pontget this for the patient.   We discussed:  Risk of using diphenhydramine for allergy relief in context of other sedating drugs. Want to reduce anticholinergic and sedating risk.   Plan -Replace diphenhydramine with loratadine or cetirizine for allergy relief -Coordinate with Dr. 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 Gabapentin 300 mg QHS (feels this is helping) . APAP-codeine 300-30 mg q8hr PRN (for leg pain) . Meloxicam 15 mg daily PRN (takes 3 per week  Has a lot of pain in left leg.   Patient wonders about the combination of gabapentin and trazodone in combination. Noted that both can have sedating effects, but since she is on relatively low doses of gabapentin and this is helpful for her neuropathy the benefit is greater than the risk considering sedation. She is taking both medications at  bedtime and does not awake groggy or feeling dizzy. Will continue to monitor.    Plan -Continue current medications  Misc / OTC   . Multivitamin daily  . Miralax 17 g daily (takes twice weekly) . Liquifilm tears 1.4% ophth 1 drop both eyes PRN  . Trazodone 100 mg daily PRN- insomnia (uses nightly) .  Plan -Continue current medications  Vaccines   Reviewed and discussed  patient's vaccination history.    Immunization History  Administered Date(s) Administered  . Fluad Quad(high Dose 65+) 11/26/2018  . Influenza, High Dose Seasonal PF 11/30/2013, 12/17/2016, 12/09/2017  . Influenza,inj,Quad PF,6+ Mos 12/12/2015  . PFIZER SARS-COV-2 Vaccination 04/12/2019, 05/06/2019  . Pneumococcal Conjugate-13 06/01/2014  . Pneumococcal Polysaccharide-23 12/12/2015  . Zoster 03/05/2010    Plan -Recommended patient receive Shingrix vaccine in pharmacy.  -Recommended patient receive Td booster vaccine in office if covered by insurance  Medication Management   Pt uses [facility] for all medications Uses pill box? No - small amount of daily meds Pt endorses 100% compliance  Plan -Continue current medication management strategy    Meds to D/C from list Magnilife DB topical cream

## 2019-09-23 ENCOUNTER — Other Ambulatory Visit: Payer: Self-pay

## 2019-09-23 ENCOUNTER — Ambulatory Visit: Payer: Medicare Other | Admitting: Pharmacist

## 2019-09-23 DIAGNOSIS — T7840XD Allergy, unspecified, subsequent encounter: Secondary | ICD-10-CM

## 2019-09-23 DIAGNOSIS — M8580 Other specified disorders of bone density and structure, unspecified site: Secondary | ICD-10-CM

## 2019-09-23 NOTE — Chronic Care Management (AMB) (Signed)
Chronic Care Management Pharmacy  Name: Tanya Harris  MRN: [id number] DOB: Apr 23, 1932   Chief Complaint/ HPI  Tanya Harris,  86 y.o. , female presents for their Initial CCM visit with the clinical pharmacist via telephone due to COVID-19 Pandemic.  PCP : Darreld Mclean, MD  Their chronic conditions include: COPD, Hyperlipidemia, Osteopenia, Allergic Rhinitis, Chronic Pain  Office Visits: None since last CCM visit on 08/27/19.   Consult Visit: None since last CCM visit on 08/27/19.  Allergies  Allergen Reactions  . Ibuprofen Swelling    Mouth Swelling- however pt confirms that she is able to take mobic with no problems 11/2016   . Amoxicillin Nausea And Vomiting    Did it involve swelling of the face/tongue/throat, SOB, or low BP? no Did it involve sudden or severe rash/hives, skin peeling, or any reaction on the inside of your mouth or nose? no Did you need to seek medical attention at a hospital or doctor's office? no  When did it last happen?2005 If all above answers are "NO", may proceed with cephalosporin use.     Medications: Outpatient Encounter Medications as of 09/23/2019  Medication Sig Note  . acetaminophen-codeine (TYLENOL #3) 300-30 MG tablet Take 1 tablet by mouth every 8 (eight) hours as needed for moderate pain. For left knee pain (Patient not taking: Reported on 08/27/2019)   . aspirin 81 MG tablet Take 81 mg by mouth daily.   . Calcium Carbonate-Vitamin D (CALCIUM 600+D) 600-400 MG-UNIT per tablet Take 2 tablets by mouth daily. 800 units vitamin D 08/27/2019: Takes one tablet daily  . fluticasone (FLONASE) 50 MCG/ACT nasal spray Place 2 sprays into both nostrils daily.   . Fluticasone-Salmeterol (ADVAIR) 250-50 MCG/DOSE AEPB Inhale 1 puff into the lungs 2 (two) times daily.   Marland Kitchen gabapentin (NEURONTIN) 300 MG capsule Take 1 capsule (300 mg total) by mouth at bedtime.   . meloxicam (MOBIC) 15 MG tablet TAKE 1 TABLET(15 MG) BY MOUTH DAILY AS NEEDED  FOR PAIN   . Multiple Vitamin (MULTIVITAMIN) tablet Take 1 tablet by mouth daily.   Marland Kitchen omega-3 fish oil (MAXEPA) 1000 MG CAPS capsule Take 1 capsule by mouth daily.    Marland Kitchen OVER THE COUNTER MEDICATION Apply 1 application topically as needed (neuropathy/feet). OTC Pain relieving foot cream: Magnilife DB (Patient not taking: Reported on 08/27/2019)   . polyethylene glycol (MIRALAX / GLYCOLAX) packet Take 17 g by mouth daily.   . polyvinyl alcohol (LIQUIFILM TEARS) 1.4 % ophthalmic solution Place 1 drop into both eyes as needed for dry eyes.   . traZODone (DESYREL) 100 MG tablet Take 1 tablet (100 mg total) by mouth daily as needed. Use for insomnia    No facility-administered encounter medications on file as of 09/23/2019.   SDOH Screenings   Alcohol Screen:   . Last Alcohol Screening Score (AUDIT):   Depression (PHQ2-9):   . PHQ-2 Score:   Financial Resource Strain:   . Difficulty of Paying Living Expenses:   Food Insecurity:   . Worried About Charity fundraiser in the Last Year:   . [place] in the Last Year:   Housing:   . Last Housing Risk Score:   Physical Activity:   . Days of Exercise per Week:   . Minutes of Exercise per Session:   Social Connections:   . Frequency of Communication with Friends and Family:   . Frequency of Social Gatherings with Friends and Family:   . Attends Religious Services:   .  Active Member of Clubs or Organizations:   . Attends Archivist Meetings:   Marland Kitchen Marital Status:   Stress:   . Feeling of Stress :   Tobacco Use: Low Risk   . Smoking Tobacco Use: Never Smoker  . Smokeless Tobacco Use: Never Used  Transportation Needs:   . Film/video editor (Medical):   Marland Kitchen Lack of Transportation (Non-Medical):      Current Diagnosis/Assessment:  Goals Addressed            This Visit's Progress   . Chronic Care Management Pharmacy Care Plan       CARE PLAN ENTRY (see longitudinal plan of care for additional care plan  information)  Current Barriers:  . Chronic Disease Management support, education, and care coordination needs related to COPD, Hyperlipidemia, Osteopenia, Allergic Rhinitis, Chronic Pain   Hyperlipidemia Lab Results  Component Value Date/Time   LDLCALC 122 (H) 05/28/2019 09:52 AM   . Pharmacist Clinical Goal(s): o Over the next 180 days, patient will work with PharmD and providers to achieve LDL goal < 100 or prevention of upward trend . Current regimen:  . Omega 3 Fish Oil 1000 mg daily   . Aspirin 81 mg daily . Interventions: o Discussed the importance of diet and exercise to reduce LDL . Patient self care activities - Over the next 180 days, patient will: o Maintain cholesterol medication regimen.   Allergies . Pharmacist Clinical Goal(s) o Over the next 90 days, patient will work with PharmD and providers to reduce symptoms of allergies . Current regimen:  . Flonase Nasal spray 50 mcg/act 2 spray daily  . Diphenhydramine 25mg  as needed . Interventions: o Recommended patient to replace diphenhydramine with loratadine or cetirizine (done) o Collaboration with provider regarding medication management (referral to allergist - completed) o Refrain from taking allergy medications including trazodone because it has antihistamine activity . Patient self care activities - Over the next 90 days, patient will: o Replace diphenhydramine with loratadine or cetirizine o Refrain from taking allergy meds and trazodone before allergy testing  Osteopenia . Pharmacist Clinical Goal(s) o Over the next 90 days, patient will work with PharmD and providers to reduce risk of fracture due to osteopenia . Current regimen:  o Calcium Carbonate-Vitamin D 600mg -800u 1 tab daily  o 2 servings of dairy daily  . Interventions: o Recommended patient to schedule visit for DEXA . Patient self care activities - Over the next 90 days, patient will: o Schedule visit for DEXA  Health Maintenance   . Pharmacist Clinical Goal(s) o Over the next 90 days, patient will work with PharmD and providers to complete health maintenance screenings/vaccinations . Interventions: o Recommended patient receive Shingrix vaccine in pharmacy.  o Recommended patient receive Td booster vaccine in office if covered by insurance . Patient self care activities - Over the next 90 days, patient will: o Receive Shingrix vaccine in pharmacy.  o Receive Td booster vaccine in office if covered by insurance  Medication management . Pharmacist Clinical Goal(s): o Over the next 90 days, patient will work with PharmD and providers to maintain optimal medication adherence . Current pharmacy: [facility] . Interventions o Comprehensive medication review performed. o Continue current medication management strategy . Patient self care activities - Over the next 90 days, patient will: o Focus on medication adherence by filling and taking medications appropriately  o Take medications as prescribed o Report any questions or concerns to PharmD and/or provider(s)  Please see past updates related to this  goal by clicking on the "Past Updates" button in the selected goal       Social Hx:  Moved here from [place] to be closer to their kids.  Moved here 6 years ago. Only community outside of children is church, but this has been put on hold due to [place]. Married 65 years.  Has 2 sons and 2 granddaughters.    Osteopenia   Last DEXA Scan: 12/18/16    T-Score femoral neck: -2.0  T-Score total hip: -2.0  T-Score lumbar spine: n/a  T-Score forearm radius: -1.9  10-year probability of major osteoporotic fracture: 15.1%  10-year probability of hip fracture: 2.5%  Vit D, 25-Hydroxy  Date Value Ref Range Status  10/27/2018 40.7 30.0 - 100.0 ng/mL Final    Comment:    Vitamin D deficiency has been defined by the Institute of Medicine and an Endocrine Society practice guideline as a level of serum 25-OH vitamin D less  than 20 ng/mL (1,2). The Endocrine Society went on to further define vitamin D insufficiency as a level between 21 and 29 ng/mL (2). 1. IOM (Institute of Medicine). 2010. Dietary reference    intakes for calcium and D. Jones: The    [employer]. 2. Holick MF, Binkley [place], Bischoff-Ferrari HA, et al.    Evaluation, treatment, and prevention of vitamin D    deficiency: an Endocrine Society clinical practice    guideline. JCEM. 2011 Jul; 96(7):1911-30.      Patient is not a candidate for pharmacologic treatment  Patient has failed these meds in past: None noted Patient is currently controlled on the following medications:  Calcium Carbonate-Vitamin D 600mg -800u 1 tab daily   Two servings of dairy daily total ~600mg  of calcium to total 1200mg  of calcium daily  She remembers taking fosamax in the past for years, but does not recall when she stopped taking it.  Has a DEXA scan ordered. Reminded patient to get schedule for this to continue to assess her bone health  We discussed:  Recommend [phone] units of vitamin D daily. Recommend 1200 mg of calcium daily from dietary and supplemental sources.   Update 09/23/19 Has not gotten DEXA scheduled, but realizes this is something she should do soon  Plan -Schedule DEXA -Continue current medications  Allergic Rhinitis   Patient has failed these meds in past: None noted  Patient is currently stable on the following medications:  . Flonase Nasal spray 50 mcg/act 2 spray daily  . Diphenhydramine 25mg  as needed  Does report she feels sleepy after taking the diphenhydramine in the morning  Recommended loratadine (Claritin) or cetirizine (Zyrtec) to replace diphenhydramine noting other sedating drugs such as trazodone and gabapentin in patient's medication regimen.   She would like to get a referral to an allergy doctor to get allergy shots as she felt they were helpful in the past. Will coordinate with Dr. Lorelei Pont to get  this for the patient.   We discussed:  Risk of using diphenhydramine for allergy relief in context of other sedating drugs. Want to reduce anticholinergic and sedating risk.   Update 09/23/19 She has scheduled an appt with allergist. Was told to refrain for antihisitamine meds for 3 days prior to visit.  Asks if trazodone has antihistamine activity. Confirmed that it does.  Has replaced diphenhydramine with loratadine and feels it works to some degree.   Plan -Replace diphenhydramine with loratadine or cetirizine for allergy relief -Coordinate with Dr. Lorelei Pont to get patient allergist referral  Vaccines  Reviewed and discussed patient's vaccination history.    Immunization History  Administered Date(s) Administered  . Fluad Quad(high Dose 65+) 11/26/2018  . Influenza, High Dose Seasonal PF 11/30/2013, 12/17/2016, 12/09/2017  . Influenza,inj,Quad PF,6+ Mos 12/12/2015  . PFIZER SARS-COV-2 Vaccination 04/12/2019, 05/06/2019  . Pneumococcal Conjugate-13 06/01/2014  . Pneumococcal Polysaccharide-23 12/12/2015  . Zoster 03/05/2010    Plan -Recommended patient receive Shingrix vaccine in pharmacy.  -Recommended patient receive Td booster vaccine in office if covered by insurance  Medication Management   Pt uses [facility] for all medications Uses pill box? No - small amount of daily meds Pt endorses 100% compliance  Plan -Continue current medication management strategy    Meds to D/C from list Magnilife DB topical cream

## 2019-09-26 NOTE — Patient Instructions (Addendum)
Visit Information  Goals Addressed            This Visit's Progress   . Chronic Care Management Pharmacy Care Plan       CARE PLAN ENTRY (see longitudinal plan of care for additional care plan information)  Current Barriers:  . Chronic Disease Management support, education, and care coordination needs related to COPD, Hyperlipidemia, Osteopenia, Allergic Rhinitis, Chronic Pain   Hyperlipidemia Lab Results  Component Value Date/Time   LDLCALC 122 (H) 05/28/2019 09:52 AM   . Pharmacist Clinical Goal(s): o Over the next 180 days, patient will work with PharmD and providers to achieve LDL goal < 100 or prevention of upward trend . Current regimen:  . Omega 3 Fish Oil 1000 mg daily   . Aspirin 81 mg daily . Interventions: o Discussed the importance of diet and exercise to reduce LDL . Patient self care activities - Over the next 180 days, patient will: o Maintain cholesterol medication regimen.   Allergies . Pharmacist Clinical Goal(s) o Over the next 90 days, patient will work with PharmD and providers to reduce symptoms of allergies . Current regimen:  . Flonase Nasal spray 50 mcg/act 2 spray daily  . Diphenhydramine 25mg  as needed . Interventions: o Recommended patient to replace diphenhydramine with loratadine or cetirizine (done) o Collaboration with provider regarding medication management (referral to allergist - completed) o Refrain from taking allergy medications including trazodone because it has antihistamine activity . Patient self care activities - Over the next 90 days, patient will: o Replace diphenhydramine with loratadine or cetirizine o Refrain from taking allergy meds and trazodone before allergy testing  Osteopenia . Pharmacist Clinical Goal(s) o Over the next 90 days, patient will work with PharmD and providers to reduce risk of fracture due to osteopenia . Current regimen:  o Calcium Carbonate-Vitamin D 600mg -800u 1 tab daily  o 2 servings of dairy  daily  . Interventions: o Recommended patient to schedule visit for DEXA . Patient self care activities - Over the next 90 days, patient will: o Schedule visit for DEXA  Health Maintenance  . Pharmacist Clinical Goal(s) o Over the next 90 days, patient will work with PharmD and providers to complete health maintenance screenings/vaccinations . Interventions: o Recommended patient receive Shingrix vaccine in pharmacy.  o Recommended patient receive Td booster vaccine in office if covered by insurance . Patient self care activities - Over the next 90 days, patient will: o Receive Shingrix vaccine in pharmacy.  o Receive Td booster vaccine in office if covered by insurance  Medication management . Pharmacist Clinical Goal(s): o Over the next 90 days, patient will work with PharmD and providers to maintain optimal medication adherence . Current pharmacy: [facility] . Interventions o Comprehensive medication review performed. o Continue current medication management strategy . Patient self care activities - Over the next 90 days, patient will: o Focus on medication adherence by filling and taking medications appropriately  o Take medications as prescribed o Report any questions or concerns to PharmD and/or provider(s)  Please see past updates related to this goal by clicking on the "Past Updates" button in the selected goal         The patient verbalized understanding of instructions provided today and agreed to receive a mailed copy of patient instruction and/or educational materials.  Telephone follow up appointment with pharmacy team member scheduled for: 12/23/2019  Melvenia Beam Loura Pitt, PharmD Clinical Pharmacist [facility] [phone]

## 2019-09-28 ENCOUNTER — Other Ambulatory Visit: Payer: Self-pay

## 2019-09-28 DIAGNOSIS — J449 Chronic obstructive pulmonary disease, unspecified: Secondary | ICD-10-CM

## 2019-09-28 DIAGNOSIS — R739 Hyperglycemia, unspecified: Secondary | ICD-10-CM

## 2019-09-28 DIAGNOSIS — M8580 Other specified disorders of bone density and structure, unspecified site: Secondary | ICD-10-CM

## 2019-09-28 DIAGNOSIS — E785 Hyperlipidemia, unspecified: Secondary | ICD-10-CM

## 2019-10-14 ENCOUNTER — Ambulatory Visit: Payer: Medicare Other | Admitting: Allergy and Immunology

## 2019-10-20 ENCOUNTER — Ambulatory Visit: Payer: Medicare Other | Admitting: Adult Health

## 2019-10-21 ENCOUNTER — Telehealth (INDEPENDENT_AMBULATORY_CARE_PROVIDER_SITE_OTHER): Payer: Medicare Other | Admitting: Adult Health

## 2019-10-21 ENCOUNTER — Encounter: Payer: Self-pay | Admitting: Adult Health

## 2019-10-21 DIAGNOSIS — G6289 Other specified polyneuropathies: Secondary | ICD-10-CM

## 2019-10-21 NOTE — Progress Notes (Signed)
[facility] [address] [place]. [place] [address] [phone]       FOLLOW UP VISIT NOTE  [name] Date of Birth:  [birth year] Medical Record Number:  [id number]   Referring MD: Suella Broad [facility] provider: Dr. Leonie Man  Reason for Referral: Neuropathy   Virtual Visit via Video Note  I connected with Tanya Harris on 10/21/19 at  7:45 AM EDT by a video enabled telemedicine application located at [facility] and verified that I am speaking with the correct person using two identifiers who was located at their own home.   Braxton Feathers [employer] administrator) scheduled visit who discussed the limitations of evaluation and management by telemedicine and the availability of in person appointments. The patient expressed understanding and agreed to proceed.   HPI:  Today, 10/21/2019, Tanya Harris is being seen via virtual visit for follow-up regarding neuropathy.  Currently on gabapentin 300 mg nightly tolerating dosage well and reports great benefit of neuropathy pain as well as improvement of her sleep at night Occasionally experiences increased pain in the evening or that will awaken her at night. She will use topical OTC cream with benefit Denies daytime symptoms  No concerns at this time      History provided for reference purposes only Virtual visit 05/28/2019 JM: Tanya Harris is a 86 year old female who is being seen today, 05/28/2019, via virtual visit for follow-up regarding neuropathy.  At prior visit, Dr. Leonie Man recommended increasing Topamax dosage to eventual 100 mg daily but patient unable to tolerate due to reports of increased "shakiness" and headaches therefore currently on 50 mg nightly.  Recommend trialing 50 mg twice daily but patient declines need of daytime use as symptoms are only present at night.  She denies benefit at current dosage.  Discussion at prior visit regarding initiation of pregabalin but she reports contacting  her insurance company and will be expensive for her to trial.  She has not previously tried gabapentin.  Her gait and balance have been stable.  No further concerns at this time.   Initial visit 10/27/2018 Dr. Phillips Climes. [name] is a 86 year old pleasant Caucasian lady seen today for initial office consultation visit for neuropathy.  History is obtained from the patient and her husband and review of referral notes.  Patient states that she has had bilateral tingling numbness in her feet for greater than 5 years while she was living in [place] and then moved to [place].  Primary care physician told her that she had a peripheral neuropathy but she did not see a neurologist and did not have EMG nerve conduction studies done.  Over the years she feels the paresthesias seem to be getting worse they have more severe now and constant.  They seem to be more with.  Is having activity and at night.  Previously they used to be at the bottom of her feet but now they seem to have progressed to involve up to her cough.  She is also noticed that her balance is off she stumbles a lot and her too often catches when she is trying to walk fast.  She has had a few minor falls but none in the last 1 year.  She has been using a walker for the last 4 years but mostly due to degenerative arthritis in her back as well as still need left knee surgery.  She denies any history of bowel bladder dysfunction, severe back injury or fall.  She saw Dr. Nelva Bush for pain in the  low back radiating down the left leg.  X-ray of the lumbar spine were obtained on 09/29/2018 which I personally reviewed which showed just osteopenia without any compression fracture.  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  She has not had any recent EMG nerve conduction study or MRI scan of the lumbar spine.  She has not had any lab work for reversible causes of neuropathy.  She denies history of diabetes, alcohol intake or exposure  to toxic medications which can cause neuropathy. Update 01/21/2019 Dr. Leonie Man : She returns for follow-up after last visit 200 months ago.  She states she is tolerating Topamax 50 mg at night quite well without side effects but is not so sure that it is helping at all.  She does take trazodone 100 mg which helps her sleep.  She is not as much bothered during the day with the paresthesias and does not take any medicine for that.  She does apply Aspercreme locally which helps only temporarily for couple of hours.  She used to find some benefit with pool therapy but she is no longer able to do that because of knee pain.  She feels her back pain has improved and Dr. Alcide Evener feels she does not need vertebroplasty anymore.  Patient did undergo neuropathy panel labs at last visit which were all normal except elevated ANA but she does not have any clinical symptomatology to suggest lupus.  This is likely a lab false positive.  EMG nerve conduction study done on 12/11/2018 confirmed axonal peripheral polyneuropathy.  She continues to have paresthesias in the feet but feels her gait and balance are okay she has had no falls or injuries.  She does use a wheeled walker at all times.      ROS:   14 system review of systems is positive for numbness/tingling and all other systems negative   PMH:  Past Medical History:  Diagnosis Date  . Buckle type impacted fracture of the distal left radial metaphysis 05/04/2014  . Closed nondisplaced fracture of styloid process of left ulna 05/04/2014  . Coccygeal contusion 05/02/2015  . COPD (chronic obstructive pulmonary disease) (HCC)    Mild  . DJD (degenerative joint disease) of knee   . Fall at home 05/04/2018  . Rotator cuff tendinitis, left 01/03/2018  . Scoliosis   . Spinal stenosis     Social History:  Social History   Socioeconomic History  . Marital status: Married    Spouse name: Not on file  . Number of children: Not on file  . Years of education: Not on file  .  Highest education level: Not on file  Occupational History  . Not on file  Tobacco Use  . Smoking status: Never Smoker  . Smokeless tobacco: Never Used  Vaping Use  . Vaping Use: Never used  Substance and Sexual Activity  . Alcohol use: No  . Drug use: Yes  . Sexual activity: Not on file  Other Topics Concern  . Not on file  Social History Narrative   Moved from [place] to [place] in 2015   Social Determinants of Health   Financial Resource Strain:   . Difficulty of Paying Living Expenses:   Food Insecurity:   . Worried About Charity fundraiser in the Last Year:   . Arboriculturist in the Last Year:   Transportation Needs:   . Film/video editor (Medical):   Marland Kitchen Lack of Transportation (Non-Medical):   Physical  Activity:   . Days of Exercise per Week:   . Minutes of Exercise per Session:   Stress:   . Feeling of Stress :   Social Connections:   . Frequency of Communication with Friends and Family:   . Frequency of Social Gatherings with Friends and Family:   . Attends Religious Services:   . Active Member of Clubs or Organizations:   . Attends Archivist Meetings:   Marland Kitchen Marital Status:   Intimate Partner Violence:   . Fear of Current or Ex-Partner:   . Emotionally Abused:   Marland Kitchen Physically Abused:   . Sexually Abused:     Medications:   Current Outpatient Medications on File Prior to Visit  Medication Sig Dispense Refill  . acetaminophen-codeine (TYLENOL #3) 300-30 MG tablet Take 1 tablet by mouth every 8 (eight) hours as needed for moderate pain. For left knee pain (Patient not taking: Reported on 08/27/2019) 30 tablet 0  . aspirin 81 MG tablet Take 81 mg by mouth daily.    . Calcium Carbonate-Vitamin D (CALCIUM 600+D) 600-400 MG-UNIT per tablet Take 2 tablets by mouth daily. 800 units vitamin D    . fluticasone (FLONASE) 50 MCG/ACT nasal spray Place 2 sprays into both nostrils daily. 16 g 11  . Fluticasone-Salmeterol (ADVAIR) 250-50 MCG/DOSE AEPB Inhale 1 puff  into the lungs 2 (two) times daily. 60 each 11  . gabapentin (NEURONTIN) 300 MG capsule Take 1 capsule (300 mg total) by mouth at bedtime. 90 capsule 3  . meloxicam (MOBIC) 15 MG tablet TAKE 1 TABLET(15 MG) BY MOUTH DAILY AS NEEDED FOR PAIN 90 tablet 1  . Multiple Vitamin (MULTIVITAMIN) tablet Take 1 tablet by mouth daily.    Marland Kitchen omega-3 fish oil (MAXEPA) 1000 MG CAPS capsule Take 1 capsule by mouth daily.     Marland Kitchen OVER THE COUNTER MEDICATION Apply 1 application topically as needed (neuropathy/feet). OTC Pain relieving foot cream: Magnilife DB (Patient not taking: Reported on 08/27/2019)    . polyethylene glycol (MIRALAX / GLYCOLAX) packet Take 17 g by mouth daily. 14 each 0  . polyvinyl alcohol (LIQUIFILM TEARS) 1.4 % ophthalmic solution Place 1 drop into both eyes as needed for dry eyes.    . traZODone (DESYREL) 100 MG tablet Take 1 tablet (100 mg total) by mouth daily as needed. Use for insomnia 90 tablet 3   No current facility-administered medications on file prior to visit.    Allergies:   Allergies  Allergen Reactions  . Ibuprofen Swelling    Mouth Swelling- however pt confirms that she is able to take mobic with no problems 11/2016   . Amoxicillin Nausea And Vomiting    Did it involve swelling of the face/tongue/throat, SOB, or low BP? no Did it involve sudden or severe rash/hives, skin peeling, or any reaction on the inside of your mouth or nose? no Did you need to seek medical attention at a hospital or doctor's office? no  When did it last happen?2005 If all above answers are "NO", may proceed with cephalosporin use.     Physical Exam Limited due to visit type   General: well developed, well nourished, very pleasant elderly Caucasian female, seated, in no evident distress Head: head normocephalic and atraumatic.    Neurologic Exam Mental Status: Awake and fully alert. Oriented to place and time. Recent and remote memory intact. Attention span, concentration and fund of  knowledge appropriate. Mood and affect appropriate.  Cranial Nerves: Extraocular movements full without nystagmus. Hearing intact  to voice. Facial sensation intact. Face, tongue, palate moves normally and symmetrically.  Shoulder shrug symmetric. Motor: No evidence of weakness per drift assessment Coordination: Rapid alternating movements normal in all extremities. Finger-to-nose and heel-to-shin performed accurately bilaterally.      ASSESSMENT/PLAN: 86 year old Caucasian lady with a longstanding history of bilateral lower extremity paresthesias and gait imbalance likely from longstanding peripheral neuropathy of undetermined etiology.  Unable to tolerate increased dose of Topamax and lower dose without benefit     AXONAL NEUROPATHY -Continue gabapentin 300 mg nightly -Offered as needed dosage for increased neuropathy pain but she declines as occasional breakthrough pain currently manageable. She will call if increased dosage as needed -Offered compound transdermal cream but she prefers to continue OTC cream. She will call if interested in trialing transdermal cream -No benefit with use of Topamax with difficulty tolerating increased dose   Follow-up in 6 months or call earlier if needed -patient requested to call back to schedule visit  I spent 15 minutes of non-face-to-face time with patient via virtual visit.  This included previsit chart review, lab review, study review, order entry, electronic health record documentation, patient education regarding ongoing use of gabapentin and improvement of neuropathy pain as well as additional treatment options and answered all questions to patient satisfaction  Frann Rider, [facility]  [facility] [address] [place], [place] [address]  Phone [phone] Fax [phone] Note: This document was prepared with digital dictation and possible smart phrase technology. Any transcriptional errors that result from  this process are unintentional.

## 2019-10-23 NOTE — Progress Notes (Signed)
I agree with the above plan 

## 2019-11-12 DIAGNOSIS — J0100 Acute maxillary sinusitis, unspecified: Secondary | ICD-10-CM | POA: Diagnosis not present

## 2019-11-25 DIAGNOSIS — Z09 Encounter for follow-up examination after completed treatment for conditions other than malignant neoplasm: Secondary | ICD-10-CM | POA: Diagnosis not present

## 2019-11-25 DIAGNOSIS — Z8709 Personal history of other diseases of the respiratory system: Secondary | ICD-10-CM | POA: Diagnosis not present

## 2019-11-27 ENCOUNTER — Other Ambulatory Visit: Payer: Self-pay

## 2019-11-27 ENCOUNTER — Ambulatory Visit (INDEPENDENT_AMBULATORY_CARE_PROVIDER_SITE_OTHER): Payer: Medicare Other | Admitting: *Deleted

## 2019-11-27 DIAGNOSIS — Z23 Encounter for immunization: Secondary | ICD-10-CM | POA: Diagnosis not present

## 2019-11-27 NOTE — Progress Notes (Signed)
Patient here for high dose flu vaccine.  Vaccine given in right deltoid and patient tolerated well.   

## 2019-12-16 ENCOUNTER — Ambulatory Visit: Payer: Medicare Other | Admitting: Allergy and Immunology

## 2019-12-19 ENCOUNTER — Ambulatory Visit: Payer: Medicare Other | Attending: Internal Medicine

## 2019-12-19 ENCOUNTER — Other Ambulatory Visit: Payer: Self-pay

## 2019-12-19 DIAGNOSIS — Z23 Encounter for immunization: Secondary | ICD-10-CM

## 2019-12-19 NOTE — Progress Notes (Signed)
   Covid-19 Vaccination Clinic  Name:  Tanya Harris    MRN: [id number] DOB: January 03, 1933  12/19/2019  [name] was observed post Covid-19 immunization for 15 minutes without incident. She was provided with Vaccine Information Sheet and instruction to access the V-Safe system.   [name] was instructed to call 911 with any severe reactions post vaccine: Marland Kitchen Difficulty breathing  . Swelling of face and throat  . A fast heartbeat  . A bad rash all over body  . Dizziness and weakness

## 2019-12-23 ENCOUNTER — Emergency Department (HOSPITAL_BASED_OUTPATIENT_CLINIC_OR_DEPARTMENT_OTHER)
Admission: EM | Admit: 2019-12-23 | Discharge: 2019-12-23 | Disposition: A | Discharge status: Home / Self Care | Payer: Medicare Other | Attending: Emergency Medicine | Admitting: Emergency Medicine

## 2019-12-23 ENCOUNTER — Encounter (HOSPITAL_BASED_OUTPATIENT_CLINIC_OR_DEPARTMENT_OTHER): Payer: Self-pay

## 2019-12-23 ENCOUNTER — Telehealth: Payer: Medicare Other

## 2019-12-23 ENCOUNTER — Ambulatory Visit: Payer: Medicare Other | Admitting: Pharmacist

## 2019-12-23 ENCOUNTER — Other Ambulatory Visit: Payer: Self-pay

## 2019-12-23 DIAGNOSIS — Z96652 Presence of left artificial knee joint: Secondary | ICD-10-CM | POA: Insufficient documentation

## 2019-12-23 DIAGNOSIS — H1131 Conjunctival hemorrhage, right eye: Secondary | ICD-10-CM

## 2019-12-23 DIAGNOSIS — M8580 Other specified disorders of bone density and structure, unspecified site: Secondary | ICD-10-CM

## 2019-12-23 DIAGNOSIS — J449 Chronic obstructive pulmonary disease, unspecified: Secondary | ICD-10-CM | POA: Insufficient documentation

## 2019-12-23 DIAGNOSIS — H579 Unspecified disorder of eye and adnexa: Secondary | ICD-10-CM | POA: Diagnosis present

## 2019-12-23 DIAGNOSIS — J302 Other seasonal allergic rhinitis: Secondary | ICD-10-CM

## 2019-12-23 NOTE — Progress Notes (Signed)
Hey, Im not seeing this on her list to take off?

## 2019-12-23 NOTE — Chronic Care Management (AMB) (Signed)
Chronic Care Management Pharmacy  Name: Tanya Harris  MRN: [id number] DOB: February 02, 1933   Chief Complaint/ HPI  Tanya Harris,  86 y.o. , female presents for their Follow-Up CCM visit with the clinical pharmacist via telephone due to COVID-19 Pandemic.  PCP : Darreld Mclean, MD  Their chronic conditions include: COPD, Hyperlipidemia, Osteopenia, Allergic Rhinitis, Chronic Pain  Office Visits: None since last CCM visit on 08/27/19.   Consult Visit: 11/25/19: Otolarynology visit w/ Dr. Laurance Flatten - Resolved sinusitis. Took oral abx.   Allergies  Allergen Reactions   Ibuprofen Swelling    Mouth Swelling- however pt confirms that she is able to take mobic with no problems 11/2016    Amoxicillin Nausea And Vomiting    Did it involve swelling of the face/tongue/throat, SOB, or low BP? no Did it involve sudden or severe rash/hives, skin peeling, or any reaction on the inside of your mouth or nose? no Did you need to seek medical attention at a hospital or doctor's office? no  When did it last happen?2005 If all above answers are NO, may proceed with cephalosporin use.     Medications: Outpatient Encounter Medications as of 12/23/2019  Medication Sig Note   acetaminophen-codeine (TYLENOL #3) 300-30 MG tablet Take 1 tablet by mouth every 8 (eight) hours as needed for moderate pain. For left knee pain (Patient not taking: Reported on 08/27/2019)    aspirin 81 MG tablet Take 81 mg by mouth daily.    Calcium Carbonate-Vitamin D (CALCIUM 600+D) 600-400 MG-UNIT per tablet Take 2 tablets by mouth daily. 800 units vitamin D 08/27/2019: Takes one tablet daily   fluticasone (FLONASE) 50 MCG/ACT nasal spray Place 2 sprays into both nostrils daily.    Fluticasone-Salmeterol (ADVAIR) 250-50 MCG/DOSE AEPB Inhale 1 puff into the lungs 2 (two) times daily.    gabapentin (NEURONTIN) 300 MG capsule Take 1 capsule (300 mg total) by mouth at bedtime.    meloxicam (MOBIC) 15 MG tablet  TAKE 1 TABLET(15 MG) BY MOUTH DAILY AS NEEDED FOR PAIN    Multiple Vitamin (MULTIVITAMIN) tablet Take 1 tablet by mouth daily.    omega-3 fish oil (MAXEPA) 1000 MG CAPS capsule Take 1 capsule by mouth daily.     OVER THE COUNTER MEDICATION Apply 1 application topically as needed (neuropathy/feet). OTC Pain relieving foot cream: Magnilife DB (Patient not taking: Reported on 08/27/2019)    polyethylene glycol (MIRALAX / GLYCOLAX) packet Take 17 g by mouth daily.    polyvinyl alcohol (LIQUIFILM TEARS) 1.4 % ophthalmic solution Place 1 drop into both eyes as needed for dry eyes.    traZODone (DESYREL) 100 MG tablet Take 1 tablet (100 mg total) by mouth daily as needed. Use for insomnia    No facility-administered encounter medications on file as of 12/23/2019.   SDOH Screenings   Alcohol Screen:    Last Alcohol Screening Score (AUDIT): Not on file  Depression (PHQ2-9): Low Risk    PHQ-2 Score: 0  Financial Resource Strain:    Difficulty of Paying Living Expenses: Not on file  Food Insecurity:    Worried About Running Out of Food in the Last Year: Not on file   Ran Out of Food in the Last Year: Not on file  Housing:    Last Housing Risk Score: Not on file  Physical Activity:    Days of Exercise per Week: Not on file   Minutes of Exercise per Session: Not on file  Social Connections:    Frequency of Communication  with Friends and Family: Not on file   Frequency of Social Gatherings with Friends and Family: Not on file   Attends Religious Services: Not on file   Active Member of Clubs or Organizations: Not on file   Attends Archivist Meetings: Not on file   Marital Status: Not on file  Stress:    Feeling of Stress : Not on file  Tobacco Use: Low Risk    Smoking Tobacco Use: Never Smoker   Smokeless Tobacco Use: Never Used  Transportation Needs:    Film/video editor (Medical): Not on file   Lack of Transportation (Non-Medical): Not on file      Current Diagnosis/Assessment:  Goals Addressed            This Visit's Progress    Chronic Care Management Pharmacy Care Plan       CARE PLAN ENTRY (see longitudinal plan of care for additional care plan information)  Current Barriers:   Chronic Disease Management support, education, and care coordination needs related to COPD, Hyperlipidemia, Osteopenia, Allergic Rhinitis, Chronic Pain   Hyperlipidemia Lab Results  Component Value Date/Time   LDLCALC 122 (H) 05/28/2019 09:52 AM    Pharmacist Clinical Goal(s): o Over the next 180 days, patient will work with PharmD and providers to achieve LDL goal < 100 or prevention of upward trend  Current regimen:   Omega 3 Fish Oil 1000 mg daily    Aspirin 81 mg daily  Interventions: o Discussed the importance of diet and exercise to reduce LDL  Patient self care activities - Over the next 180 days, patient will: o Maintain cholesterol medication regimen.   Allergies  Pharmacist Clinical Goal(s) o Over the next 90 days, patient will work with PharmD and providers to reduce symptoms of allergies  Current regimen:   Flonase Nasal spray 50 mcg/act 2 spray daily   Interventions: o Recommended patient to replace diphenhydramine with loratadine or cetirizine (done) o Collaboration with provider regarding medication management (referral to allergist - completed)  Patient self care activities - Over the next 90 days, patient will: o Replace diphenhydramine with loratadine or cetirizine  Osteopenia  Pharmacist Clinical Goal(s) o Over the next 90 days, patient will work with PharmD and providers to reduce risk of fracture due to osteopenia  Current regimen:  o Calcium Carbonate-Vitamin D 600mg -800u 1 tab daily  o 2 servings of dairy daily   Interventions: o Recommended patient to schedule visit for DEXA  Patient self care activities - Over the next 90 days, patient will: o Schedule visit for [place] Maintenance    Pharmacist Clinical Goal(s) o Over the next 90 days, patient will work with PharmD and providers to complete health maintenance screenings/vaccinations  Interventions: o Recommended patient receive Shingrix vaccine in pharmacy.  o Recommended patient receive Td booster vaccine in office if covered by insurance  Patient self care activities - Over the next 90 days, patient will: o Receive Shingrix vaccine in pharmacy.  o Receive Td booster vaccine in office if covered by insurance  Medication management  Pharmacist Clinical Goal(s): o Over the next 90 days, patient will work with PharmD and providers to maintain optimal medication adherence  Current pharmacy: [facility]  Interventions o Comprehensive medication review performed. o Continue current medication management strategy  Patient self care activities - Over the next 90 days, patient will: o Focus on medication adherence by filling and taking medications appropriately  o Take medications as prescribed o Report any questions or  concerns to PharmD and/or provider(s)  Please see past updates related to this goal by clicking on the "Past Updates" button in the selected goal       Social Hx:  Moved here from [place] to be closer to their kids.  Moved here 6 years ago. Only community outside of children is church, but this has been put on hold due to [place]. Married 65 years.  Has 2 sons and 2 granddaughters.    Osteopenia   Last DEXA Scan: 12/18/16    T-Score femoral neck: -2.0  T-Score total hip: -2.0  T-Score lumbar spine: n/a  T-Score forearm radius: -1.9  10-year probability of major osteoporotic fracture: 15.1%  10-year probability of hip fracture: 2.5%  Vit D, 25-Hydroxy  Date Value Ref Range Status  10/27/2018 40.7 30.0 - 100.0 ng/mL Final    Comment:    Vitamin D deficiency has been defined by the Institute of Medicine and an Endocrine Society practice guideline as a level of serum 25-OH vitamin D less  than 20 ng/mL (1,2). The Endocrine Society went on to further define vitamin D insufficiency as a level between 21 and 29 ng/mL (2). 1. IOM (Institute of Medicine). 2010. Dietary reference    intakes for calcium and D. [place]: The    [employer]. 2. Holick MF, Binkley Lostant, Bischoff-Ferrari HA, et al.    Evaluation, treatment, and prevention of vitamin D    deficiency: an Endocrine Society clinical practice    guideline. JCEM. 2011 Jul; 96(7):1911-30.      Patient is not a candidate for pharmacologic treatment  Patient has failed these meds in past: None noted Patient is currently controlled on the following medications:  Calcium Carbonate-Vitamin D 600mg -800u 1 tab daily   Two servings of dairy daily total ~600mg  of calcium to total 1200mg  of calcium daily  She remembers taking fosamax in the past for years, but does not recall when she stopped taking it.  Has a DEXA scan ordered. Reminded patient to get schedule for this to continue to assess her bone health  We discussed:  Recommend [phone] units of vitamin D daily. Recommend 1200 mg of calcium daily from dietary and supplemental sources.   Update 09/23/19 Has not gotten DEXA scheduled, but realizes this is something she should do soon  Update 12/23/19 Dexa is ordered, but still not scheduled.   Plan -Schedule DEXA -Continue current medications  Allergic Rhinitis   Patient has failed these meds in past: None noted  Patient is currently stable on the following medications:   Flonase Nasal spray 50 mcg/act 2 spray daily   Does report she feels sleepy after taking the diphenhydramine in the morning  Recommended loratadine (Claritin) or cetirizine (Zyrtec) to replace diphenhydramine noting other sedating drugs such as trazodone and gabapentin in patient's medication regimen.   She would like to get a referral to an allergy doctor to get allergy shots as she felt they were helpful in the past. Will  coordinate with Dr. Lorelei Pont to get this for the patient.   We discussed:  Risk of using diphenhydramine for allergy relief in context of other sedating drugs. Want to reduce anticholinergic and sedating risk.   Update 09/23/19 She has scheduled an appt with allergist. Was told to refrain for antihisitamine meds for 3 days prior to visit.  Asks if trazodone has antihistamine activity. Confirmed that it does.  Has replaced diphenhydramine with loratadine and feels it works to some degree.   Update 12/23/19 Asks  why diphenhydramine is not ideal for her.  Discussed that diphenhydramine has anticholingeric affects and increases risk of falls Plans to get allergy shots, but plans to wait until 2 weeks after covid booster  Plan -Continue current medications.  -Consider cetirizine or claritin if oral antihistamine needed  Insomnia    Patient has failed these meds in past: None noted  Patient is currently controlled on the following medications:  Trazodone 100mg  daily  Feels trazodone is really helping her sleep. Feels fully rested when she awake.  Plan -Continue current medications   Vaccines   Reviewed and discussed patient's vaccination history.    Immunization History  Administered Date(s) Administered   Fluad Quad(high Dose 65+) 11/26/2018, 11/27/2019   Influenza, High Dose Seasonal PF 11/30/2013, 12/17/2016, 12/09/2017   Influenza,inj,Quad PF,6+ Mos 12/12/2015   PFIZER SARS-COV-2 Vaccination 04/12/2019, 05/06/2019, 12/19/2019   Pneumococcal Conjugate-13 06/01/2014   Pneumococcal Polysaccharide-23 12/12/2015   Zoster 03/05/2010   Patient states she scraped her arm on a metal surface and was concern  Reminded patient to get Td booster and Shingrix vaccine  Plan -Recommended patient receive Shingrix vaccine in pharmacy.  -Recommended patient receive Td booster vaccine in office if covered by insurance  Medication Management   Pt uses [facility] for all  medications Uses pill box? No - small amount of daily meds Pt endorses 100% compliance  Plan -Continue current medication management strategy  Meds to D/C from list Magnilife DB topical cream   [place], PharmD Clinical Pharmacist [facility] at [facility] [phone]

## 2019-12-23 NOTE — Patient Instructions (Signed)
Visit Information  Goals Addressed            This Visit's Progress   . Chronic Care Management Pharmacy Care Plan       CARE PLAN ENTRY (see longitudinal plan of care for additional care plan information)  Current Barriers:  . Chronic Disease Management support, education, and care coordination needs related to COPD, Hyperlipidemia, Osteopenia, Allergic Rhinitis, Chronic Pain   Hyperlipidemia Lab Results  Component Value Date/Time   LDLCALC 122 (H) 05/28/2019 09:52 AM   . Pharmacist Clinical Goal(s): o Over the next 180 days, patient will work with PharmD and providers to achieve LDL goal < 100 or prevention of upward trend . Current regimen:  . Omega 3 Fish Oil 1000 mg daily   . Aspirin 81 mg daily . Interventions: o Discussed the importance of diet and exercise to reduce LDL . Patient self care activities - Over the next 180 days, patient will: o Maintain cholesterol medication regimen.   Allergies . Pharmacist Clinical Goal(s) o Over the next 90 days, patient will work with PharmD and providers to reduce symptoms of allergies . Current regimen:  . Flonase Nasal spray 50 mcg/act 2 spray daily  . Interventions: o Recommended patient to replace diphenhydramine with loratadine or cetirizine (done) o Collaboration with provider regarding medication management (referral to allergist - completed) . Patient self care activities - Over the next 90 days, patient will: o Replace diphenhydramine with loratadine or cetirizine  Osteopenia . Pharmacist Clinical Goal(s) o Over the next 90 days, patient will work with PharmD and providers to reduce risk of fracture due to osteopenia . Current regimen:  o Calcium Carbonate-Vitamin D 600mg -800u 1 tab daily  o 2 servings of dairy daily  . Interventions: o Recommended patient to schedule visit for DEXA . Patient self care activities - Over the next 90 days, patient will: o Schedule visit for DEXA  Health Maintenance  . Pharmacist  Clinical Goal(s) o Over the next 90 days, patient will work with PharmD and providers to complete health maintenance screenings/vaccinations . Interventions: o Recommended patient receive Shingrix vaccine in pharmacy.  o Recommended patient receive Td booster vaccine in office if covered by insurance . Patient self care activities - Over the next 90 days, patient will: o Receive Shingrix vaccine in pharmacy.  o Receive Td booster vaccine in office if covered by insurance  Medication management . Pharmacist Clinical Goal(s): o Over the next 90 days, patient will work with PharmD and providers to maintain optimal medication adherence . Current pharmacy: [facility] . Interventions o Comprehensive medication review performed. o Continue current medication management strategy . Patient self care activities - Over the next 90 days, patient will: o Focus on medication adherence by filling and taking medications appropriately  o Take medications as prescribed o Report any questions or concerns to PharmD and/or provider(s)  Please see past updates related to this goal by clicking on the "Past Updates" button in the selected goal         The patient verbalized understanding of instructions provided today and agreed to receive a mailed copy of patient instruction and/or educational materials.  Telephone follow up appointment with pharmacy team member scheduled for: 06/22/2020 at 1:00pm  Tanya Harris, PharmD Clinical Pharmacist [facility] at [facility] [phone]

## 2019-12-23 NOTE — ED Triage Notes (Signed)
Pt c/o redness to right eye, pupil dilated-states she woke with c/o this am-pt had PCP video visit-states she she did not tell PCP "it wasn't bothering me and I forgot"-NAD-to triage using own rollater

## 2019-12-23 NOTE — Discharge Instructions (Addendum)
You were evaluated in the Emergency Department and after careful evaluation, we did not find any emergent condition requiring admission or further testing in the hospital.  Your exam/testing today was overall reassuring.  Your symptoms seem to be due to a small ruptured blood vessel of the white part of the eye.  This is not a dangerous condition and will heal with time.  We recommend follow-up with your eye doctor if you have further eye complaints.  Please return to the Emergency Department if you experience any worsening of your condition.  Thank you for allowing [place] to be a part of your care.

## 2019-12-23 NOTE — ED Provider Notes (Signed)
[facility] Emergency Department Provider Note MRN:  [id number]  Arrival date & time: 12/23/19     Chief Complaint   Eye Problem   History of Present Illness   Tanya Harris is a 86 y.o. year-old female with a history of COPD presenting to the ED with chief complaint of eye problem.  Patient noticed redness to the right eye when she woke up this morning.  Denies trauma to the eye, no vision change, no pain in the.  Thought maybe her right pupil was larger than the left pupil and so here for evaluation.  No other complaints.  Review of Systems  A problem-focused ROS was performed. Positive for eye redness.  Patient denies fever, headache, vision loss.  Patient's Health History    Past Medical History:  Diagnosis Date  . Buckle type impacted fracture of the distal left radial metaphysis 05/04/2014  . Closed nondisplaced fracture of styloid process of left ulna 05/04/2014  . Coccygeal contusion 05/02/2015  . COPD (chronic obstructive pulmonary disease) (HCC)    Mild  . DJD (degenerative joint disease) of knee   . Fall at home 05/04/2018  . Rotator cuff tendinitis, left 01/03/2018  . Scoliosis   . Spinal stenosis     Past Surgical History:  Procedure Laterality Date  . APPENDECTOMY  2005  . CATARACT EXTRACTION     Bilateral  . LAPAROSCOPY N/A 05/04/2018   Procedure: LAPAROSCOPY DIAGNOSTIC , FEMORAL  HERNIA REPAIR WITH SMALL BOWEL RESECTION WITH TAP BLOCK;  Surgeon: Winton Offord Boston, MD;  Location: [facility] ORS;  Service: General;  Laterality: N/A;  . NASAL SINUS SURGERY    . TONSILLECTOMY    . TOTAL KNEE ARTHROPLASTY  2001   Left    Family History  Problem Relation Age of Onset  . Heart disease Mother 53       Deceased  . Diabetes Mother   . Heart disease Father 7       Deceased  . Asthma Father   . Heart disease Maternal Uncle   . Colon cancer Brother   . Diabetes Brother        #2  . Heart disease Brother        x2  . Healthy Sister   . Healthy  Brother        x1  . Hyperlipidemia Son        x2    Social History   Socioeconomic History  . Marital status: Married    Spouse name: Not on file  . Number of children: Not on file  . Years of education: Not on file  . Highest education level: Not on file  Occupational History  . Not on file  Tobacco Use  . Smoking status: Never Smoker  . Smokeless tobacco: Never Used  Vaping Use  . Vaping Use: Never used  Substance and Sexual Activity  . Alcohol use: No  . Drug use: Never  . Sexual activity: Not on file  Other Topics Concern  . Not on file  Social History Narrative   Moved from [place] to [place] in 2015   Social Determinants of Health   Financial Resource Strain:   . Difficulty of Paying Living Expenses: Not on file  Food Insecurity:   . Worried About Charity fundraiser in the Last Year: Not on file  . Ran Out of Food in the Last Year: Not on file  Transportation Needs:   . Lack of Transportation (Medical): Not on  file  . Lack of Transportation (Non-Medical): Not on file  Physical Activity:   . Days of Exercise per Week: Not on file  . Minutes of Exercise per Session: Not on file  Stress:   . Feeling of Stress : Not on file  Social Connections:   . Frequency of Communication with Friends and Family: Not on file  . Frequency of Social Gatherings with Friends and Family: Not on file  . Attends Religious Services: Not on file  . Active Member of Clubs or Organizations: Not on file  . Attends Archivist Meetings: Not on file  . Marital Status: Not on file  Intimate Partner Violence:   . Fear of Current or Ex-Partner: Not on file  . Emotionally Abused: Not on file  . Physically Abused: Not on file  . Sexually Abused: Not on file     Physical Exam   Vitals:   12/23/19 2033  BP: (!) 194/75  Pulse: 79  Resp: 18  Temp: 98.6 F (37 C)  SpO2: 98%    CONSTITUTIONAL: Well-appearing, NAD NEURO:  Alert and oriented x 3, no focal deficits EYES:  pupils  equal and reactive, no afferent pupillary defect, normal extraocular movements, symmetric 20/20 visual acuity, medial right high subconjunctival hemorrhage ENT/NECK:  no LAD, no JVD CARDIO: Regular rate, well-perfused, normal S1 and S2 PULM:  CTAB no wheezing or rhonchi GI/GU:  normal bowel sounds, non-distended, non-tender MSK/SPINE:  No gross deformities, no edema SKIN:  no rash, atraumatic PSYCH:  Appropriate speech and behavior  *Additional and/or pertinent findings included in MDM below  Diagnostic and Interventional Summary    EKG Interpretation  Date/Time:    Ventricular Rate:    PR Interval:    QRS Duration:   QT Interval:    QTC Calculation:   R Axis:     Text Interpretation:        Labs Reviewed - No data to display  No orders to display    Medications - No data to display   Procedures  /  Critical Care Procedures  ED Course and Medical Decision Making  I have reviewed the triage vital signs, the nursing notes, and pertinent available records from the EMR.  Listed above are laboratory and imaging tests that I personally ordered, reviewed, and interpreted and then considered in my medical decision making (see below for details).  Unclear as to how or why patient thought her pupil was dilated, but it is definitely not dilated on my exam.  Completely symmetric and normal pupils, otherwise a very reassuring exam with the exception of subconjunctival hemorrhage.  Nothing to suggest acute glaucoma given lack of pain.  Provided reassurance, advised ophthalmology follow-up if she has further concerns with pupillary dilation.       Barth Kirks. [place] Small, [place] [email or web address]  Final Clinical Impressions(s) / ED Diagnoses     ICD-10-CM   1. Subconjunctival hemorrhage of right eye  H11.31     ED Discharge Orders    None       Discharge Instructions Discussed with and Provided to Patient:     Discharge  Instructions     You were evaluated in the Emergency Department and after careful evaluation, we did not find any emergent condition requiring admission or further testing in the hospital.  Your exam/testing today was overall reassuring.  Your symptoms seem to be due to a small ruptured blood vessel of the white part of the  eye.  This is not a dangerous condition and will heal with time.  We recommend follow-up with your eye doctor if you have further eye complaints.  Please return to the Emergency Department if you experience any worsening of your condition.  Thank you for allowing [place] to be a part of your care.       Maudie Flakes, MD 12/23/19 2217

## 2019-12-23 NOTE — Progress Notes (Signed)
It's listed as "over the counter medication"

## 2019-12-29 ENCOUNTER — Encounter: Payer: Self-pay | Admitting: Family Medicine

## 2019-12-29 ENCOUNTER — Ambulatory Visit (HOSPITAL_BASED_OUTPATIENT_CLINIC_OR_DEPARTMENT_OTHER)
Admission: RE | Admit: 2019-12-29 | Discharge: 2019-12-29 | Disposition: A | Discharge status: Home / Self Care | Payer: Medicare Other | Source: Ambulatory Visit | Attending: Family Medicine | Admitting: Family Medicine

## 2019-12-29 ENCOUNTER — Other Ambulatory Visit: Payer: Self-pay

## 2019-12-29 DIAGNOSIS — M8589 Other specified disorders of bone density and structure, multiple sites: Secondary | ICD-10-CM | POA: Diagnosis not present

## 2019-12-29 DIAGNOSIS — M8580 Other specified disorders of bone density and structure, unspecified site: Secondary | ICD-10-CM | POA: Insufficient documentation

## 2019-12-29 DIAGNOSIS — R569 Unspecified convulsions: Secondary | ICD-10-CM | POA: Diagnosis not present

## 2019-12-29 DIAGNOSIS — Z780 Asymptomatic menopausal state: Secondary | ICD-10-CM | POA: Diagnosis not present

## 2019-12-29 DIAGNOSIS — E2839 Other primary ovarian failure: Secondary | ICD-10-CM | POA: Diagnosis not present

## 2020-01-04 MED ORDER — ALENDRONATE SODIUM 70 MG PO TABS: 70.0000 mg | ORAL_TABLET | ORAL | 3 refills | Status: DC

## 2020-02-17 DIAGNOSIS — J0101 Acute recurrent maxillary sinusitis: Secondary | ICD-10-CM | POA: Diagnosis not present

## 2020-02-17 DIAGNOSIS — J0100 Acute maxillary sinusitis, unspecified: Secondary | ICD-10-CM | POA: Diagnosis not present

## 2020-03-08 ENCOUNTER — Telehealth: Payer: Self-pay | Admitting: Pharmacist

## 2020-03-08 NOTE — Progress Notes (Addendum)
Chronic Care Management Pharmacy Assistant   Name: Tanya Harris  MRN: [id number] DOB: 24-Aug-1932  Reason for Encounter: Disease State  Patient Questions:  1.  Have you seen any other providers since your last visit? No  2.  Any changes in your medicines or health? No    PCP : Copland, Gwenlyn Found, MD   Their chronic conditions include: COPD, Hyperlipidemia, Osteopenia, Allergic Rhinitis, Chronic Pain.  Office Visits: None since last CCM visit on 12-23-2019.    Consults: None since last CCM visit on 12-23-2019.  Hospitalizations: 12-23-2019: Patient presented in the ED c/o redness of her right eye. She did not report any pain but thought maybe her right pupil was larger than the left pupil. ED of [facility] did not find any emergent issues that required admission after evaluation. There were no medication changes.  TE: 12-29-2019: After patient completed bone density scan, Dr. Patsy Lager prescribed Fosamax 70 mg to take every seven days.  Allergies:   Allergies  Allergen Reactions   Ibuprofen Swelling    Mouth Swelling- however pt confirms that she is able to take mobic with no problems 11/2016    Amoxicillin Nausea And Vomiting    Did it involve swelling of the face/tongue/throat, SOB, or low BP? no Did it involve sudden or severe rash/hives, skin peeling, or any reaction on the inside of your mouth or nose? no Did you need to seek medical attention at a hospital or doctor's office? no  When did it last happen?      2005 If all above answers are "NO", may proceed with cephalosporin use.     Medications: Outpatient Encounter Medications as of 03/08/2020  Medication Sig Note   acetaminophen-codeine (TYLENOL #3) 300-30 MG tablet Take 1 tablet by mouth every 8 (eight) hours as needed for moderate pain. For left knee pain (Patient not taking: Reported on 08/27/2019)    alendronate (FOSAMAX) 70 MG tablet Take 1 tablet (70 mg total) by mouth every 7 (seven) days. Take with a full  glass of water on an empty stomach.    aspirin 81 MG tablet Take 81 mg by mouth daily.    Calcium Carbonate-Vitamin D (CALCIUM 600+D) 600-400 MG-UNIT per tablet Take 2 tablets by mouth daily. 800 units vitamin D 08/27/2019: Takes one tablet daily   fluticasone (FLONASE) 50 MCG/ACT nasal spray Place 2 sprays into both nostrils daily.    Fluticasone-Salmeterol (ADVAIR) 250-50 MCG/DOSE AEPB Inhale 1 puff into the lungs 2 (two) times daily.    gabapentin (NEURONTIN) 300 MG capsule Take 1 capsule (300 mg total) by mouth at bedtime.    meloxicam (MOBIC) 15 MG tablet TAKE 1 TABLET(15 MG) BY MOUTH DAILY AS NEEDED FOR PAIN    Multiple Vitamin (MULTIVITAMIN) tablet Take 1 tablet by mouth daily.    omega-3 fish oil (MAXEPA) 1000 MG CAPS capsule Take 1 capsule by mouth daily.     OVER THE COUNTER MEDICATION Apply 1 application topically as needed (neuropathy/feet). OTC Pain relieving foot cream: Magnilife DB (Patient not taking: Reported on 08/27/2019)    polyethylene glycol (MIRALAX / GLYCOLAX) packet Take 17 g by mouth daily.    polyvinyl alcohol (LIQUIFILM TEARS) 1.4 % ophthalmic solution Place 1 drop into both eyes as needed for dry eyes.    traZODone (DESYREL) 100 MG tablet Take 1 tablet (100 mg total) by mouth daily as needed. Use for insomnia    No facility-administered encounter medications on file as of 03/08/2020.    Current Diagnosis:  Patient Active Problem List   Diagnosis Date Noted   Scoliosis, levoconvex 05/04/2018   Strangulated left femoral hernia s/p lap SB resction & hernia repair 05/04/2018 05/04/2018   SBO (small bowel obstruction) ([facility]) 05/04/2018   Fibroid uterus 05/04/2018   Abdominal wall asymmetry 05/04/2018   Diverticulosis of left colon 05/04/2018   Hyperglycemia 05/04/2018   Spinal stenosis    Rotator cuff tendinitis, left 01/03/2018   History of excessive cerumen 12/04/2017   Chronic pain of left knee 10/01/2017   Fall at home 05/03/2017   Lesion of skin of breast  12/12/2015   HOH (hard of hearing) 12/08/2014   Hyperlipidemia 06/04/2014   Buckle type impacted fracture of the distal left radial metaphysis 05/04/2014   Closed nondisplaced fracture of styloid process of left ulna 05/04/2014   Chronic allergic rhinitis 04/15/2014   COPD with chronic bronchitis ([facility]) 11/02/2013   Osteopenia 11/02/2013   Gait instability 11/02/2013    Goals Addressed   None    Contacted the patient for a General Adherence touch point. Inquired how her eye felt since her last ED visit with complaints of redness. She stated it was much better without any further issues. She initially had a video visit with her insurance, [employer], and spoke with a physician who advised that she be seen in the ER. At this point she does not have any complaints.  She was able to get the first dose of her Shingrix vaccine. The second dose is scheduled for early February. The patient reported not obtaining her TD booster yet. She attempted to get the shot at [facility], but the line was very long.  Mrs. Derocco had a bone density scan done in October and has started taking Fosamax weekly without side any side effects.   Follow-Up:  Pharmacist Review   Fanny Skates, [place] Pharmacist Assistant [phone]  Reviewed by: De Blanch, PharmD, BCACP Clinical Pharmacist [facility] [phone]

## 2020-04-14 IMAGING — CT CT KNEE*L* W/O CM
3 of 4 series · 13 of 33 positions shown, 16 images · non-contrast
Comparison: None.

CLINICAL DATA: Left knee pain for 15 days.

EXAM:
CT OF THE LEFT KNEE WITHOUT CONTRAST
TECHNIQUE: Multidetector CT imaging of the LEFT knee was performed according to
the standard protocol. Multiplanar CT image reconstructions were
also generated.

[Series 5: cor bone · coronal · 0.28mm/px · 1 of 145 slices shown]
[im 73/145  bone]
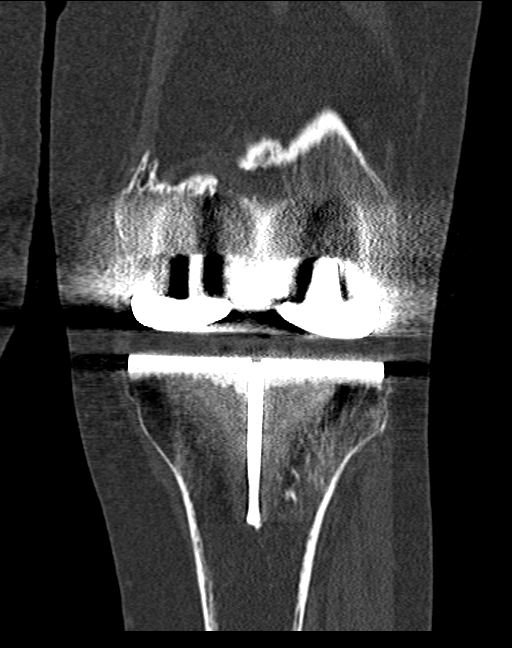

[Series 7: axial st · axial · 0.27mm/px · z∈[+267,+416]mm · 7 of 177 slices shown, 9 images]
[im 14/177  soft-tissue]
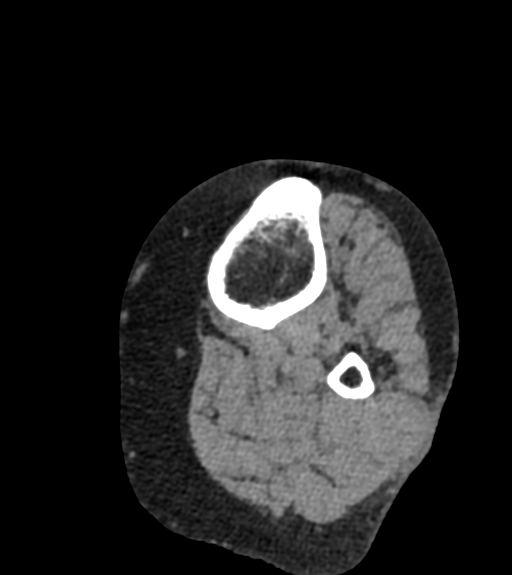
[im 14/177  bone]
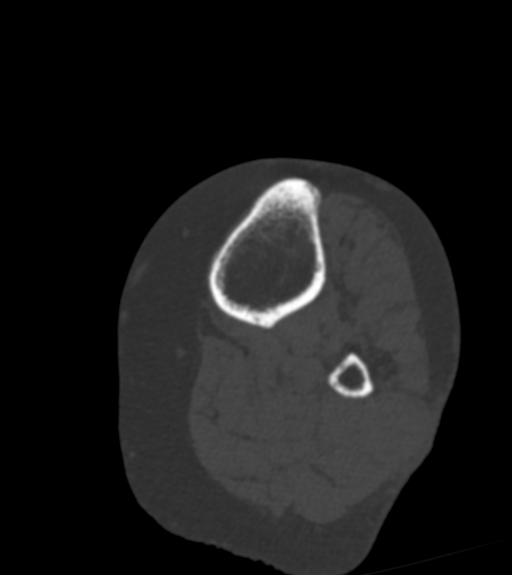
[im 41/177  bone]
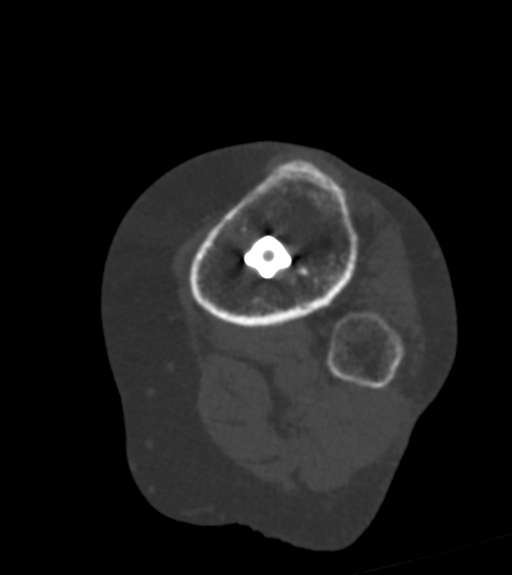
[im 68/177  bone]
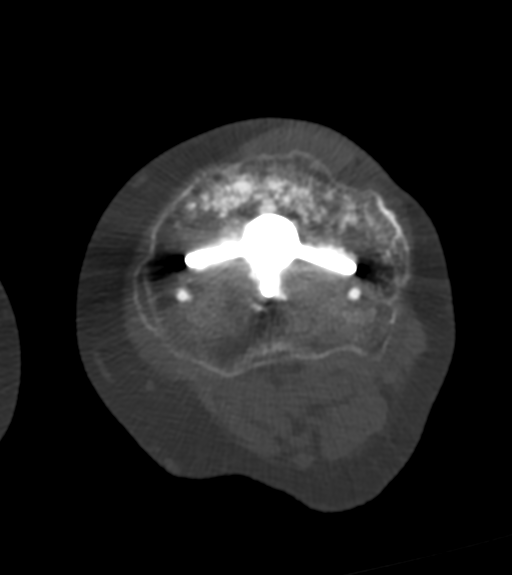
[im 95/177  bone]
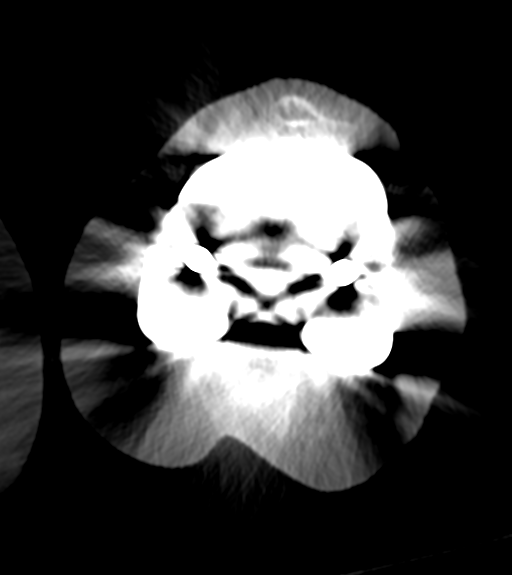
[im 109/177  soft-tissue]
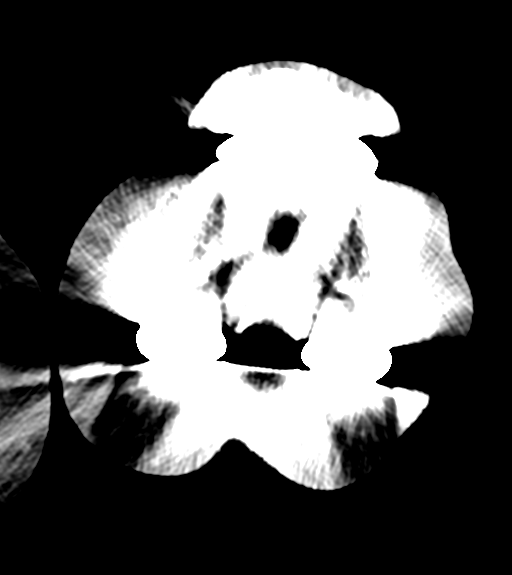
[im 109/177  bone]
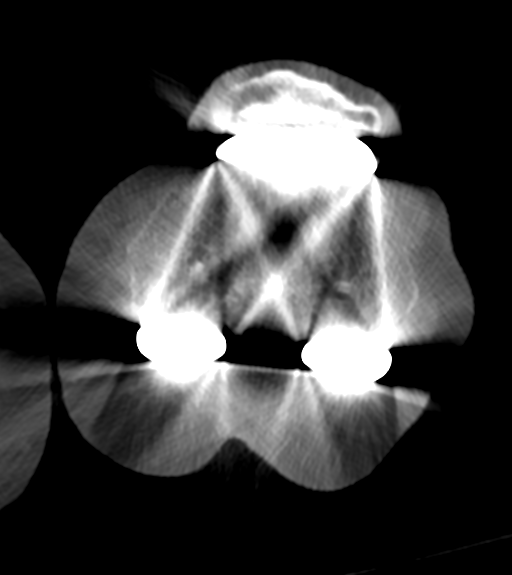
[im 136/177  bone]
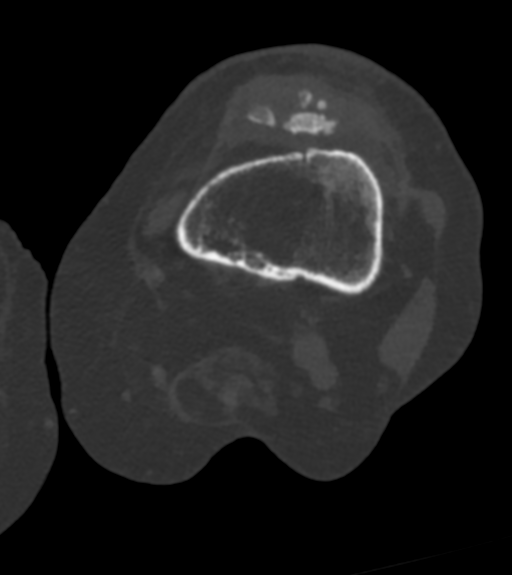
[im 163/177  bone]
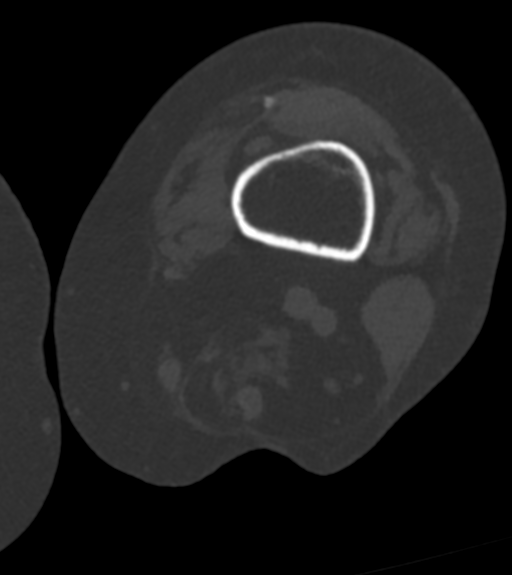

[Series 9: sag st · sagittal · 0.30mm/px · 5 of 128 slices shown, 6 images]
[im 43/128  bone]
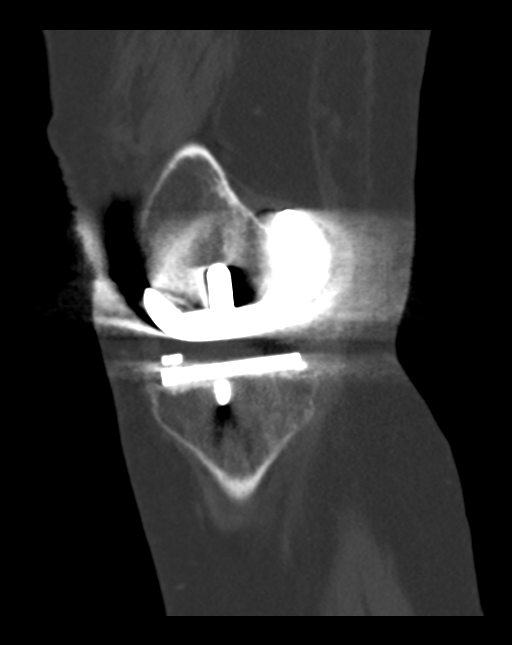
[im 53/128  bone]
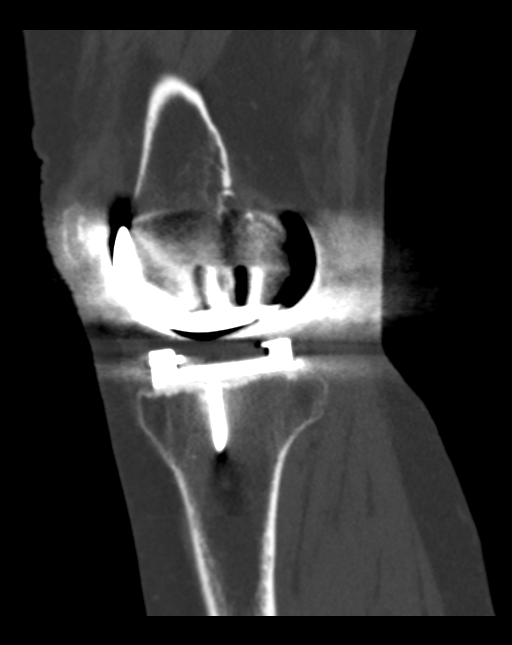
[im 64/128  soft-tissue]
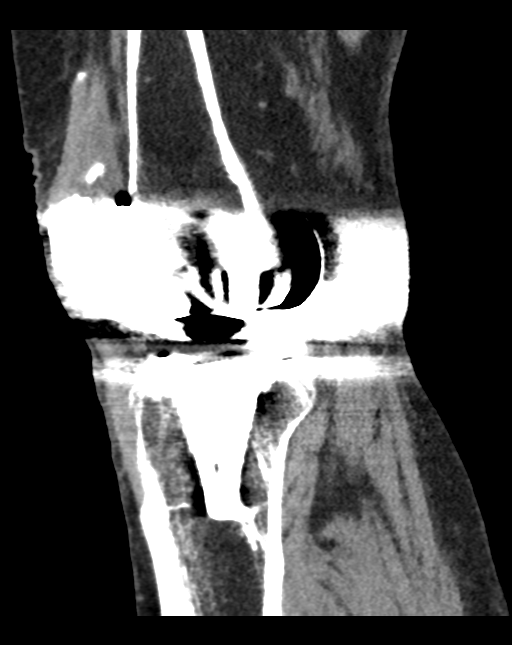
[im 64/128  bone]
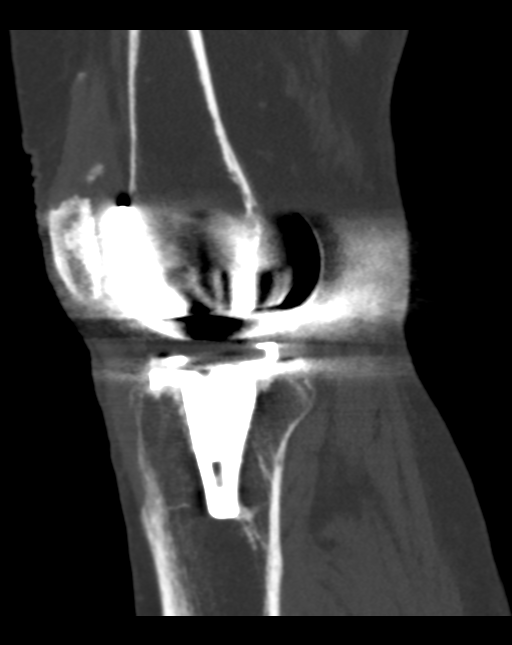
[im 75/128  bone]
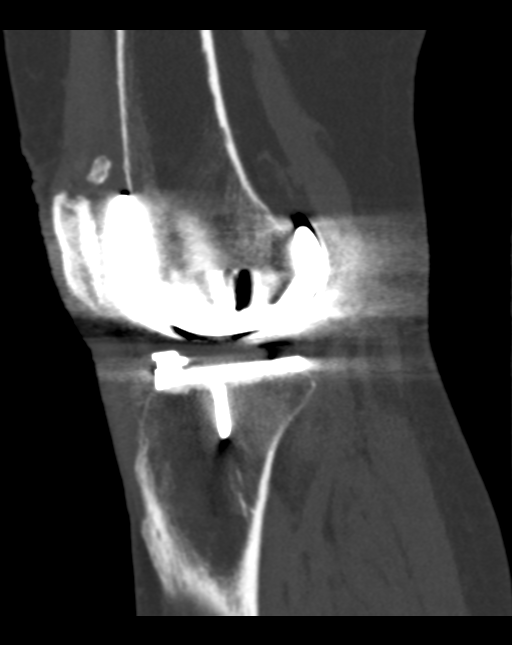
[im 85/128  bone]
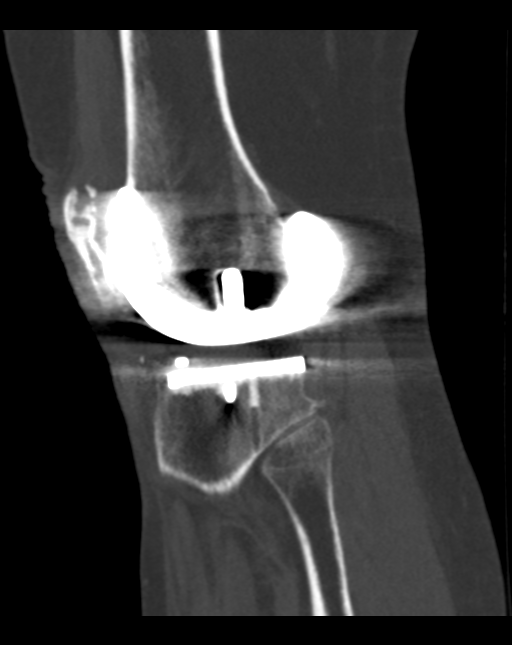

[13 of 33 positions shown; findings below may reference images not displayed]

FINDINGS: Bones/Joint/Cartilage

Left total knee arthroplasty. Alignment is normal. No hardware
failure or complication. No fracture or dislocation. No joint
effusion. No osteolysis. No periarticular fluid collection.

Ligaments

Ligaments are suboptimally evaluated by CT.

Muscles and Tendons
Severe muscle atrophy of the semimembranosus and sartorius muscles.
Enthesopathic changes versus chronic fragmentation of the superior
pole of the patella at the patellar tendon insertion. Patellar
tendon and quadriceps tendon are grossly intact.

Soft tissue
No fluid collection or hematoma.  No soft tissue mass.
IMPRESSION: 1. Left total knee arthroplasty without hardware failure or
complication. No acute osseous injury of the left knee.

## 2020-04-25 DIAGNOSIS — J3089 Other allergic rhinitis: Secondary | ICD-10-CM | POA: Diagnosis not present

## 2020-05-05 DIAGNOSIS — J3089 Other allergic rhinitis: Secondary | ICD-10-CM | POA: Diagnosis not present

## 2020-05-10 ENCOUNTER — Encounter: Payer: Self-pay | Admitting: Family Medicine

## 2020-05-11 DIAGNOSIS — J3089 Other allergic rhinitis: Secondary | ICD-10-CM | POA: Diagnosis not present

## 2020-05-18 DIAGNOSIS — J3089 Other allergic rhinitis: Secondary | ICD-10-CM | POA: Diagnosis not present

## 2020-05-23 ENCOUNTER — Other Ambulatory Visit: Payer: Self-pay

## 2020-05-23 ENCOUNTER — Telehealth (INDEPENDENT_AMBULATORY_CARE_PROVIDER_SITE_OTHER): Payer: Medicare Other | Admitting: Adult Health

## 2020-05-23 ENCOUNTER — Encounter: Payer: Self-pay | Admitting: Adult Health

## 2020-05-23 DIAGNOSIS — G6289 Other specified polyneuropathies: Secondary | ICD-10-CM

## 2020-05-23 MED ORDER — GABAPENTIN 300 MG PO CAPS: 300.0000 mg | ORAL_CAPSULE | Freq: Every day | ORAL | 3 refills | Status: DC

## 2020-05-23 NOTE — Progress Notes (Signed)
[facility] [address] [place]. [place] [address] [phone]       FOLLOW UP VISIT NOTE  [name] Date of Birth:  1932-08-07 Medical Record Number:  [id number]   Referring MD: Suella [facility] provider: Dr. Leonie Man  Reason for Referral: Neuropathy   Virtual Visit via Video Note  I connected with Tanya Harris on 05/23/20 at 10:45 AM EDT by telephone with provider located at [facility] and verified that I am speaking with the correct person using two identifiers who was located at their own home.   Jael Armstrong Arts administrator) scheduled telephone visit who discussed the limitations of evaluation and management by telemedicine and the availability of in person appointments. The patient expressed understanding and agreed to proceed.   HPI:  Today, 05/23/2020, Ms. Oates returns for 74-month follow up via telephone for neuropathy and medication refill   She has been doing well since prior visit On gabapentin 300 mg nightly -tolerating without side effects Continued benefit of painful neuropathy symptoms  No concerns or questions at this time     History provided for reference purposes only Update 10/21/2019 JM: Ms. Negron is being seen via virtual visit for follow-up regarding neuropathy. Currently on gabapentin 300 mg nightly tolerating dosage well and reports great benefit of neuropathy pain as well as improvement of her sleep at night Occasionally experiences increased pain in the evening or that will awaken her at night. She will use topical OTC cream with benefit Denies daytime symptoms No concerns at this time  Update 05/28/2019 JM: Ms. Sanks is a 87 year old female who is being seen today, 05/28/2019, via virtual visit for follow-up regarding neuropathy.  At prior visit, Dr. Leonie Man recommended increasing Topamax dosage to eventual 100 mg daily but patient unable to tolerate due to reports of increased  "shakiness" and headaches therefore currently on 50 mg nightly.  Recommend trialing 50 mg twice daily but patient declines need of daytime use as symptoms are only present at night.  She denies benefit at current dosage.  Discussion at prior visit regarding initiation of pregabalin but she reports contacting her insurance company and will be expensive for her to trial.  She has not previously tried gabapentin.  Her gait and balance have been stable.  No further concerns at this time.  Update 01/21/2019 Dr. Leonie Man : She returns for follow-up after last visit 200 months ago.  She states she is tolerating Topamax 50 mg at night quite well without side effects but is not so sure that it is helping at all.  She does take trazodone 100 mg which helps her sleep.  She is not as much bothered during the day with the paresthesias and does not take any medicine for that.  She does apply Aspercreme locally which helps only temporarily for couple of hours.  She used to find some benefit with pool therapy but she is no longer able to do that because of knee pain.  She feels her back pain has improved and Dr. Alcide Evener feels she does not need vertebroplasty anymore.  Patient did undergo neuropathy panel labs at last visit which were all normal except elevated ANA but she does not have any clinical symptomatology to suggest lupus.  This is likely a lab false positive.  EMG nerve conduction study done on 12/11/2018 confirmed axonal peripheral polyneuropathy.  She continues to have paresthesias in the feet but feels her gait and balance are okay she has had no falls or injuries.  She does use a wheeled walker at all times.   Initial visit 10/27/2018 Dr. Phillips Climes. [name] is a 87 year old pleasant Caucasian lady seen today for initial office consultation visit for neuropathy.  History is obtained from the patient and her husband and review of referral notes.  Patient states that she has had bilateral tingling numbness in her feet for  greater than 5 years while she was living in [place] and then moved to [place].  Primary care physician told her that she had a peripheral neuropathy but she did not see a neurologist and did not have EMG nerve conduction studies done.  Over the years she feels the paresthesias seem to be getting worse they have more severe now and constant.  They seem to be more with.  Is having activity and at night.  Previously they used to be at the bottom of her feet but now they seem to have progressed to involve up to her cough.  She is also noticed that her balance is off she stumbles a lot and her too often catches when she is trying to walk fast.  She has had a few minor falls but none in the last 1 year.  She has been using a walker for the last 4 years but mostly due to degenerative arthritis in her back as well as still need left knee surgery.  She denies any history of bowel bladder dysfunction, severe back injury or fall.  She saw Dr. Nelva Bush for pain in the low back radiating down the left leg.  X-ray of the lumbar spine were obtained on 09/29/2018 which I personally reviewed which showed just osteopenia without any compression fracture.  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  She has not had any recent EMG nerve conduction study or MRI scan of the lumbar spine.  She has not had any lab work for reversible causes of neuropathy.  She denies history of diabetes, alcohol intake or exposure to toxic medications which can cause neuropathy.       PMH:  Past Medical History:  Diagnosis Date  . Buckle type impacted fracture of the distal left radial metaphysis 05/04/2014  . Closed nondisplaced fracture of styloid process of left ulna 05/04/2014  . Coccygeal contusion 05/02/2015  . COPD (chronic obstructive pulmonary disease) ([facility])    Mild  . DJD (degenerative joint disease) of knee   . Fall at home 05/04/2018  . Rotator cuff tendinitis, left 01/03/2018  . Scoliosis   .  Spinal stenosis     Social History:  Social History   Socioeconomic History  . Marital status: Married    Spouse name: Not on file  . Number of children: Not on file  . Years of education: Not on file  . Highest education level: Not on file  Occupational History  . Not on file  Tobacco Use  . Smoking status: Never Smoker  . Smokeless tobacco: Never Used  Vaping Use  . Vaping Use: Never used  Substance and Sexual Activity  . Alcohol use: No  . Drug use: Never  . Sexual activity: Not on file  Other Topics Concern  . Not on file  Social History Narrative   Moved from [place] to [place] in 2015   Social Determinants of Health   Financial Resource Strain: Not on file  Food Insecurity: Not on file  Transportation Needs: Not on file  Physical Activity: Not on file  Stress: Not on file  Social Connections:  Not on file  Intimate Partner Violence: Not on file    Medications:   Current Outpatient Medications on File Prior to Visit  Medication Sig Dispense Refill  . acetaminophen-codeine (TYLENOL #3) 300-30 MG tablet Take 1 tablet by mouth every 8 (eight) hours as needed for moderate pain. For left knee pain (Patient not taking: Reported on 08/27/2019) 30 tablet 0  . alendronate (FOSAMAX) 70 MG tablet Take 1 tablet (70 mg total) by mouth every 7 (seven) days. Take with a full glass of water on an empty stomach. 12 tablet 3  . aspirin 81 MG tablet Take 81 mg by mouth daily.    . Calcium Carbonate-Vitamin D (CALCIUM 600+D) 600-400 MG-UNIT per tablet Take 2 tablets by mouth daily. 800 units vitamin D    . fluticasone (FLONASE) 50 MCG/ACT nasal spray Place 2 sprays into both nostrils daily. 16 g 11  . Fluticasone-Salmeterol (ADVAIR) 250-50 MCG/DOSE AEPB Inhale 1 puff into the lungs 2 (two) times daily. 60 each 11  . gabapentin (NEURONTIN) 300 MG capsule Take 1 capsule (300 mg total) by mouth at bedtime. 90 capsule 3  . meloxicam (MOBIC) 15 MG tablet TAKE 1 TABLET(15 MG) BY MOUTH DAILY AS  NEEDED FOR PAIN 90 tablet 1  . Multiple Vitamin (MULTIVITAMIN) tablet Take 1 tablet by mouth daily.    Marland Kitchen omega-3 fish oil (MAXEPA) 1000 MG CAPS capsule Take 1 capsule by mouth daily.     Marland Kitchen OVER THE COUNTER MEDICATION Apply 1 application topically as needed (neuropathy/feet). OTC Pain relieving foot cream: Magnilife DB (Patient not taking: Reported on 08/27/2019)    . polyethylene glycol (MIRALAX / GLYCOLAX) packet Take 17 g by mouth daily. 14 each 0  . polyvinyl alcohol (LIQUIFILM TEARS) 1.4 % ophthalmic solution Place 1 drop into both eyes as needed for dry eyes.    . traZODone (DESYREL) 100 MG tablet Take 1 tablet (100 mg total) by mouth daily as needed. Use for insomnia 90 tablet 3   No current facility-administered medications on file prior to visit.    Allergies:   Allergies  Allergen Reactions  . Ibuprofen Swelling    Mouth Swelling- however pt confirms that she is able to take mobic with no problems 11/2016   . Amoxicillin Nausea And Vomiting    Did it involve swelling of the face/tongue/throat, SOB, or low BP? no Did it involve sudden or severe rash/hives, skin peeling, or any reaction on the inside of your mouth or nose? no Did you need to seek medical attention at a hospital or doctor's office? no  When did it last happen?2005 If all above answers are "NO", may proceed with cephalosporin use.     Physical Exam N/A d/t visit type   ASSESSMENT/PLAN: 87 year old Caucasian lady with a longstanding history of bilateral lower extremity paresthesias and gait imbalance likely from longstanding peripheral neuropathy of undetermined etiology.  Unable to tolerate increased dose of Topamax and lower dose without benefit     AXONAL NEUROPATHY -Stable without worsening -Continue gabapentin 300 mg nightly - 1 yr refill provided -No benefit with use of Topamax with difficulty tolerating increased dose   Follow-up in 1 year for IN OFFICE visit or call earlier if needed   CC:   [facility] provider: Dr. Sinda Du, Gay Filler, MD    I spent 10 minutes of non-face-to-face time with patient via telephone.  This included previsit chart review, lab review, study review, order entry, electronic health record documentation, patient education and discussion regarding history  of neuropathy, ongoing use of gabapentin answered all other questions to patient satisfaction   CC:  [facility] provider: Copland, Gay Filler, MD   Frann Rider, AGNP-BC  [facility] [address] [place], [place] [address]  Phone [phone] Fax [phone] Note: This document was prepared with digital dictation and possible smart phrase technology. Any transcriptional errors that result from this process are unintentional.

## 2020-05-24 NOTE — Progress Notes (Signed)
I agree with the above plan 

## 2020-05-25 DIAGNOSIS — J3089 Other allergic rhinitis: Secondary | ICD-10-CM | POA: Diagnosis not present

## 2020-05-28 NOTE — Progress Notes (Addendum)
[place] at [facility] [address], [place], [place] [address] [phone]  Date:  05/30/2020   Name:  Tanya Harris   DOB:  [birth year]   MRN:  [id number]  PCP:  Darreld Mclean, MD    Chief Complaint: Medical Management of Chronic Issues (F/u)   History of Present Illness:  Tanya Harris is a 87 y.o. very pleasant female patient who presents with the following:  Patient today for periodic follow-up visit.  Last seen by myself about 1 year ago Older lady with history of COPD, hearing loss, osteopenia, hyperlipidemia, spinal stenosis, peripheral neuropathy  She uses trazodone for sleep, sees Dr. Herma Mering for spinal stenosis She is no longer seeing him as her pain has been stable-I am not prescribing her trazodone She does use a walker - she does like to lean, this helps her walk more easily-however, she is still not able to walk all that far She has tylenol #3 to use if needed  She has an exercise machine that she uses (it sounds like a hybrid bike type machine with arm motion) and this does help her to keep her strength up  Offered PT but she declines for now   She had a virtual visit with neurology earlier this month: ASSESSMENT/PLAN: 87 year old Caucasian lady with a longstanding history of bilateral lower extremity paresthesias and gait imbalance likely from longstanding peripheral neuropathy of undetermined etiology.  Unable to tolerate increased dose of Topamax and lower dose without benefit AXONAL NEUROPATHY -Stable without worsening -Continue gabapentin 300 mg nightly - 1 yr refill provided -No benefit with use of Topamax with difficulty tolerating increased dose  Tetanus booster- 05/02/20 Can recommend Shingrix- done, 01/11/20 and 04/08/20 Lab work 1 year ago, can update today- she is fasting  Fosamax Advair Gabapentin Meloxicam- she has an allergy to ibuprofen but meloxicam is ok  Trazodone  She is getting allergy  shots now and this may be helping with her symptoms  She no longer wishes to do mammo for breast cancer screening Dexa is UTD  Patient Active Problem List   Diagnosis Date Noted  . Scoliosis, levoconvex 05/04/2018  . Strangulated left femoral hernia s/p lap SB resction & hernia repair 05/04/2018 05/04/2018  . SBO (small bowel obstruction) ([place]) 05/04/2018  . Fibroid uterus 05/04/2018  . Abdominal wall asymmetry 05/04/2018  . Diverticulosis of left colon 05/04/2018  . Hyperglycemia 05/04/2018  . Spinal stenosis   . Rotator cuff tendinitis, left 01/03/2018  . History of excessive cerumen 12/04/2017  . Chronic pain of left knee 10/01/2017  . Fall at home 05/03/2017  . Lesion of skin of breast 12/12/2015  . HOH (hard of hearing) 12/08/2014  . Hyperlipidemia 06/04/2014  . Buckle type impacted fracture of the distal left radial metaphysis 05/04/2014  . Closed nondisplaced fracture of styloid process of left ulna 05/04/2014  . Chronic allergic rhinitis 04/15/2014  . COPD with chronic bronchitis ([place]) 11/02/2013  . Osteopenia 11/02/2013  . Gait instability 11/02/2013    Past Medical History:  Diagnosis Date  . Buckle type impacted fracture of the distal left radial metaphysis 05/04/2014  . Closed nondisplaced fracture of styloid process of left ulna 05/04/2014  . Coccygeal contusion 05/02/2015  . COPD (chronic obstructive pulmonary disease) (HCC)    Mild  . DJD (degenerative joint disease) of knee   . Fall at home 05/04/2018  . Rotator cuff tendinitis, left 01/03/2018  . Scoliosis   . Spinal stenosis  Past Surgical History:  Procedure Laterality Date  . APPENDECTOMY  2005  . CATARACT EXTRACTION     Bilateral  . LAPAROSCOPY N/A 05/04/2018   Procedure: LAPAROSCOPY DIAGNOSTIC , FEMORAL  HERNIA REPAIR WITH SMALL BOWEL RESECTION WITH TAP BLOCK;  Surgeon: Michael Boston, MD;  Location: [facility];  Service: General;  Laterality: N/A;  . NASAL SINUS SURGERY    . TONSILLECTOMY    . TOTAL KNEE  ARTHROPLASTY  2001   Left    Social History   Tobacco Use  . Smoking status: Never Smoker  . Smokeless tobacco: Never Used  Vaping Use  . Vaping Use: Never used  Substance Use Topics  . Alcohol use: No  . Drug use: Never    Family History  Problem Relation Age of Onset  . Heart disease Mother 45       Deceased  . Diabetes Mother   . Heart disease Father 76       Deceased  . Asthma Father   . Heart disease Maternal Uncle   . Colon cancer Brother   . Diabetes Brother        #2  . Heart disease Brother        x2  . Healthy Sister   . Healthy Brother        x1  . Hyperlipidemia Son        x2    Allergies  Allergen Reactions  . Ibuprofen Swelling    Mouth Swelling- however pt confirms that she is able to take mobic with no problems 11/2016   . Amoxicillin Nausea And Vomiting    Did it involve swelling of the face/tongue/throat, SOB, or low BP? no Did it involve sudden or severe rash/hives, skin peeling, or any reaction on the inside of your mouth or nose? no Did you need to seek medical attention at a hospital or doctor's office? no  When did it last happen?2005 If all above answers are "NO", may proceed with cephalosporin use.     Medication list has been reviewed and updated.  Current Outpatient Medications on File Prior to Visit  Medication Sig Dispense Refill  . alendronate (FOSAMAX) 70 MG tablet Take 1 tablet (70 mg total) by mouth every 7 (seven) days. Take with a full glass of water on an empty stomach. 12 tablet 3  . Calcium Carbonate-Vitamin D 600-400 MG-UNIT tablet Take 2 tablets by mouth daily. 800 units vitamin D    . fluticasone (FLONASE) 50 MCG/ACT nasal spray Place 2 sprays into both nostrils daily. 16 g 11  . Fluticasone-Salmeterol (ADVAIR) 250-50 MCG/DOSE AEPB Inhale 1 puff into the lungs 2 (two) times daily. 60 each 11  . gabapentin (NEURONTIN) 300 MG capsule Take 1 capsule (300 mg total) by mouth at bedtime. 90 capsule 3  . meloxicam  (MOBIC) 15 MG tablet TAKE 1 TABLET(15 MG) BY MOUTH DAILY AS NEEDED FOR PAIN 90 tablet 1  . Multiple Vitamin (MULTIVITAMIN) tablet Take 1 tablet by mouth daily.    Marland Kitchen omega-3 fish oil (MAXEPA) 1000 MG CAPS capsule Take 1 capsule by mouth daily.     Marland Kitchen OVER THE COUNTER MEDICATION Apply 1 application topically as needed (neuropathy/feet). OTC Pain relieving foot cream: Magnilife DB    . polyethylene glycol (MIRALAX / GLYCOLAX) packet Take 17 g by mouth daily. 14 each 0  . polyvinyl alcohol (LIQUIFILM TEARS) 1.4 % ophthalmic solution Place 1 drop into both eyes as needed for dry eyes.    . traZODone (DESYREL)  100 MG tablet Take 1 tablet (100 mg total) by mouth daily as needed. Use for insomnia 90 tablet 3  . UNABLE TO FIND Allergy shots weekly, Dr. Laurance Flatten.    Marland Kitchen acetaminophen-codeine (TYLENOL #3) 300-30 MG tablet Take 1 tablet by mouth every 8 (eight) hours as needed for moderate pain. For left knee pain (Patient not taking: Reported on 05/30/2020) 30 tablet 0  . aspirin 81 MG tablet Take 81 mg by mouth daily. (Patient not taking: Reported on 05/30/2020)     No current facility-administered medications on file prior to visit.    Review of Systems:  As per HPI- otherwise negative.   Physical Examination: Vitals:   05/30/20 1103  BP: (!) 142/86  Pulse: 81  Temp: 97.6 F (36.4 C)  SpO2: 97%   Vitals:   05/30/20 1103  Weight: 142 lb 9.6 oz (64.7 kg)  Height: 5\' 1"  (1.549 m)   Body mass index is 26.94 kg/m. Ideal Body Weight: Weight in (lb) to have BMI = 25: 132  GEN: no acute distress.  Elderly woman who appears her normal self, looks generally well HEENT: Atraumatic, Normocephalic.  Ears and Nose: No external deformity. CV: RRR, No M/G/R. No JVD. No thrill. No extra heart sounds. PULM: CTA B, no wheezes, crackles, rhonchi. No retractions. No resp. distress. No accessory muscle use. ABD: S, NT, ND, +BS. No rebound. No HSM. EXTR: No c/c/e PSYCH: Normally interactive. Conversant.   Patient is stooped with significant kyphosis and scoliosis.  Uses a walker Accompanied today by her husband  Assessment and Plan: Elevated glucose - Plan: Comprehensive metabolic panel, Hemoglobin A1c  Hyperlipidemia, unspecified hyperlipidemia type - Plan: Lipid panel  Primary insomnia - Plan: traZODone (DESYREL) 100 MG tablet  COPD with chronic bronchitis (HCC) - Plan: CBC, Fluticasone-Salmeterol (ADVAIR) 250-50 MCG/DOSE AEPB  Osteopenia, unspecified location - Plan: VITAMIN D 25 Hydroxy (Vit-D Deficiency, Fractures)  Fatigue, unspecified type - Plan: TSH, VITAMIN D 25 Hydroxy (Vit-D Deficiency, Fractures)  Primary osteoarthritis, unspecified site - Plan: meloxicam (MOBIC) 15 MG tablet  Vitamin D deficiency - Plan: VITAMIN D 25 Hydroxy (Vit-D Deficiency, Fractures)  Following up with a few concerns today.  We will check labs as above Refilled medications Discussed her spinal stenosis and kyphosis/scoliosis.  She is exercising as best she can to keep up her strength.  For the time being she is not interested in physical therapy which is fine.  She will let me know if she changes her mind Using diclofenac as needed for osteoarthritis and MSK pains This visit occurred during the SARS-CoV-2 public health emergency.  Safety protocols were in place, including screening questions prior to the visit, additional usage of staff PPE, and extensive cleaning of exam room while observing appropriate contact time as indicated for disinfecting solutions.    Signed Lamar Blinks, MD  Received her labs as below, message to pt  Results for orders placed or performed in visit on 05/30/20  CBC  Result Value Ref Range   WBC 7.2 4.0 - 10.5 K/uL   RBC 4.81 3.87 - 5.11 Mil/uL   Platelets 252.0 150.0 - 400.0 K/uL   Hemoglobin 14.6 12.0 - 15.0 g/dL   HCT 43.0 36.0 - 46.0 %   MCV 89.4 78.0 - 100.0 fl   MCHC 33.8 30.0 - 36.0 g/dL   RDW 14.0 11.5 - 15.5 %  Comprehensive metabolic panel  Result  Value Ref Range   Sodium 136 135 - 145 mEq/L   Potassium 3.9 3.5 - 5.1  mEq/L   Chloride 99 96 - 112 mEq/L   CO2 27 19 - 32 mEq/L   Glucose, Bld 94 70 - 99 mg/dL   BUN 16 6 - 23 mg/dL   Creatinine, Ser 0.50 0.40 - 1.20 mg/dL   Total Bilirubin 0.5 0.2 - 1.2 mg/dL   Alkaline Phosphatase 81 39 - 117 U/L   AST 24 0 - 37 U/L   ALT 16 0 - 35 U/L   Total Protein 7.3 6.0 - 8.3 g/dL   Albumin 4.1 3.5 - 5.2 g/dL   GFR 85.37 >60.00 mL/min   Calcium 9.7 8.4 - 10.5 mg/dL  Hemoglobin A1c  Result Value Ref Range   Hgb A1c MFr Bld 5.6 4.6 - 6.5 %  Lipid panel  Result Value Ref Range   Cholesterol 213 (H) 0 - 200 mg/dL   Triglycerides 90.0 0.0 - 149.0 mg/dL   HDL 55.00 >39.00 mg/dL   VLDL 18.0 0.0 - 40.0 mg/dL   LDL Cholesterol 140 (H) 0 - 99 mg/dL   Total CHOL/HDL Ratio 4    NonHDL 157.87   TSH  Result Value Ref Range   TSH 1.24 0.35 - 4.50 uIU/mL  VITAMIN D 25 Hydroxy (Vit-D Deficiency, Fractures)  Result Value Ref Range   VITD 44.74 30.00 - 100.00 ng/mL

## 2020-05-28 NOTE — Patient Instructions (Addendum)
It was great to see you again today, I will be in touch with your labs as soon as possible.  Assuming all is well, please see me in about 6 months  We did your refills today- let me know if you would like to do some PT Continue the good work with exercise

## 2020-05-30 ENCOUNTER — Encounter: Payer: Self-pay | Admitting: Family Medicine

## 2020-05-30 ENCOUNTER — Other Ambulatory Visit: Payer: Self-pay

## 2020-05-30 ENCOUNTER — Ambulatory Visit (INDEPENDENT_AMBULATORY_CARE_PROVIDER_SITE_OTHER): Payer: Medicare Other | Admitting: Family Medicine

## 2020-05-30 VITALS — BP 142/86 | HR 81 | Temp 97.6°F | Ht 61.0 in | Wt 142.6 lb

## 2020-05-30 DIAGNOSIS — E559 Vitamin D deficiency, unspecified: Secondary | ICD-10-CM | POA: Diagnosis not present

## 2020-05-30 DIAGNOSIS — E785 Hyperlipidemia, unspecified: Secondary | ICD-10-CM | POA: Diagnosis not present

## 2020-05-30 DIAGNOSIS — J449 Chronic obstructive pulmonary disease, unspecified: Secondary | ICD-10-CM

## 2020-05-30 DIAGNOSIS — R7309 Other abnormal glucose: Secondary | ICD-10-CM | POA: Diagnosis not present

## 2020-05-30 DIAGNOSIS — R5383 Other fatigue: Secondary | ICD-10-CM

## 2020-05-30 DIAGNOSIS — F5101 Primary insomnia: Secondary | ICD-10-CM | POA: Diagnosis not present

## 2020-05-30 DIAGNOSIS — M8580 Other specified disorders of bone density and structure, unspecified site: Secondary | ICD-10-CM

## 2020-05-30 DIAGNOSIS — M1991 Primary osteoarthritis, unspecified site: Secondary | ICD-10-CM | POA: Diagnosis not present

## 2020-05-30 LAB — LIPID PANEL
Cholesterol: 213 mg/dL — ABNORMAL HIGH (ref 0–200)
HDL: 55 mg/dL (ref >39.00)
LDL Cholesterol: 140 mg/dL — ABNORMAL HIGH (ref 0–99)
NonHDL: 157.87
Total CHOL/HDL Ratio: 4
Triglycerides: 90 mg/dL (ref 0.0–149.0)
VLDL: 18 mg/dL (ref 0.0–40.0)

## 2020-05-30 LAB — COMPREHENSIVE METABOLIC PANEL
ALT: 16 U/L (ref 0–35)
AST: 24 U/L (ref 0–37)
Albumin: 4.1 g/dL (ref 3.5–5.2)
Alkaline Phosphatase: 81 U/L (ref 39–117)
BUN: 16 mg/dL (ref 6–23)
CO2: 27 mEq/L (ref 19–32)
Calcium: 9.7 mg/dL (ref 8.4–10.5)
Chloride: 99 mEq/L (ref 96–112)
Creatinine, Ser: 0.5 mg/dL (ref 0.40–1.20)
GFR: 85.37 mL/min (ref >60.00)
Glucose, Bld: 94 mg/dL (ref 70–99)
Potassium: 3.9 mEq/L (ref 3.5–5.1)
Sodium: 136 mEq/L (ref 135–145)
Total Bilirubin: 0.5 mg/dL (ref 0.2–1.2)
Total Protein: 7.3 g/dL (ref 6.0–8.3)

## 2020-05-30 LAB — VITAMIN D 25 HYDROXY (VIT D DEFICIENCY, FRACTURES): VITD: 44.74 ng/mL (ref 30.00–100.00)

## 2020-05-30 LAB — CBC
HCT: 43 % (ref 36.0–46.0)
Hemoglobin: 14.6 g/dL (ref 12.0–15.0)
MCHC: 33.8 g/dL (ref 30.0–36.0)
MCV: 89.4 fl (ref 78.0–100.0)
Platelets: 252 10*3/uL (ref 150.0–400.0)
RBC: 4.81 Mil/uL (ref 3.87–5.11)
RDW: 14 % (ref 11.5–15.5)
WBC: 7.2 10*3/uL (ref 4.0–10.5)

## 2020-05-30 LAB — HEMOGLOBIN A1C: Hgb A1c MFr Bld: 5.6 % (ref 4.6–6.5)

## 2020-05-30 LAB — TSH: TSH: 1.24 u[IU]/mL (ref 0.35–4.50)

## 2020-05-30 MED ORDER — FLUTICASONE-SALMETEROL 250-50 MCG/DOSE IN AEPB: 1.0000 | INHALATION_SPRAY | Freq: Two times a day (BID) | RESPIRATORY_TRACT | 11 refills | Status: DC

## 2020-05-30 MED ORDER — TRAZODONE HCL 100 MG PO TABS: 100.0000 mg | ORAL_TABLET | Freq: Every day | ORAL | 3 refills | Status: DC | PRN

## 2020-05-30 MED ORDER — MELOXICAM 15 MG PO TABS: ORAL_TABLET | ORAL | 1 refills | Status: DC

## 2020-06-01 DIAGNOSIS — J3089 Other allergic rhinitis: Secondary | ICD-10-CM | POA: Diagnosis not present

## 2020-06-08 DIAGNOSIS — J3089 Other allergic rhinitis: Secondary | ICD-10-CM | POA: Diagnosis not present

## 2020-06-15 DIAGNOSIS — J3089 Other allergic rhinitis: Secondary | ICD-10-CM | POA: Diagnosis not present

## 2020-06-22 ENCOUNTER — Ambulatory Visit (INDEPENDENT_AMBULATORY_CARE_PROVIDER_SITE_OTHER): Payer: Medicare Other | Admitting: Pharmacist

## 2020-06-22 DIAGNOSIS — J309 Allergic rhinitis, unspecified: Secondary | ICD-10-CM

## 2020-06-22 DIAGNOSIS — J3089 Other allergic rhinitis: Secondary | ICD-10-CM | POA: Diagnosis not present

## 2020-06-22 DIAGNOSIS — E7800 Pure hypercholesterolemia, unspecified: Secondary | ICD-10-CM | POA: Diagnosis not present

## 2020-06-22 DIAGNOSIS — M85839 Other specified disorders of bone density and structure, unspecified forearm: Secondary | ICD-10-CM

## 2020-06-22 NOTE — Patient Instructions (Signed)
Visit Information  PATIENT GOALS: Goals Addressed            This Visit's Progress   . Chronic Care Management Pharmacy Care Plan   On track    CARE PLAN ENTRY (see longitudinal plan of care for additional care plan information)  Current Barriers:  . Chronic Disease Management support, education, and care coordination needs related to COPD, Hyperlipidemia, Osteopenia, Allergic Rhinitis, Chronic Pain   Hyperlipidemia Lab Results  Component Value Date/Time   LDLCALC 140 (H) 05/30/2020 11:32 AM   . Pharmacist Clinical Goal(s): o Over the next 180 days, patient will work with PharmD and providers to achieve LDL goal < 100 or prevention of upward trend . Current regimen:  . Omega 3 Fish Oil 1000 mg daily   . Interventions: o Discussed the importance of diet and exercise to reduce LDL . Patient self care activities - Over the next 180 days, patient will: o Continue daily exercise and limit saturated fat intake o Continue Omega 3 Fish Oil  Allergies . Pharmacist Clinical Goal(s) o Over the next 90 days, patient will work with PharmD and providers to reduce symptoms of allergies . Current regimen:  . Flonase Nasal spray 50 mcg/act 2 spray daily as needed . Weekly allergy injecitons . Patient self care activities - Over the next 90 days, patient will: o Continue to follow up with Dr Laurance Flatten / ENT  Osteopenia . Pharmacist Clinical Goal(s) o Over the next 90 days, patient will work with PharmD and providers to reduce risk of fracture due to osteopenia . Current regimen:  o Calcium Carbonate-Vitamin D 600mg -800u 1 tab daily  o 2 servings of dairy daily  o Muitivitamin Daily  . Interventions: o Reviewed goal of 1200mg  of calcium per day o Reviewed goal of 1000IU of vitamin D per day . Patient self care activities - Over the next 90 days, patient will: o Continue current regimen for bone health COPD:  . Pharmacist Clinical Goal(s) o Over the next 90 days, patient will work with  PharmD and providers to reduce COPD exacerbations . Current regimen:  o Advair 250/38mcg - inhale 1 puff into lungs twice a day . Interventions: o Reviewed inhaler technique o Reminded to rinse mouth after each use of Advair . Patient self care activities - Over the next 90 days, patient will: o Continue current regimen for COPD  Health Maintenance  . Pharmacist Clinical Goal(s) o Over the next 90 days, patient will work with PharmD and providers to complete health maintenance screenings/vaccinations . Interventions: o Recommended patient receive 2nd booster for COVID 19 . Patient self care activities - Over the next 90 days, patient will: o Receive 2nd booster for COVID 19  Medication management . Pharmacist Clinical Goal(s): o Over the next 90 days, patient will work with PharmD and providers to maintain optimal medication adherence . Current pharmacy: [facility] . Interventions o Comprehensive medication review performed. o Continue current medication management strategy . Patient self care activities - Over the next 90 days, patient will: o Focus on medication adherence by filling and taking medications appropriately  o Take medications as prescribed o Report any questions or concerns to PharmD and/or provider(s)  Please see past updates related to this goal by clicking on the "Past Updates" button in the selected goal         The patient verbalized understanding of instructions, educational materials, and care plan provided today and declined offer to receive copy of patient instructions, educational materials, and  care plan.   Telephone follow up appointment with care management team member scheduled for: 6 months  Cherre Robins, PharmD Clinical Pharmacist [facility] [phone]

## 2020-06-22 NOTE — Chronic Care Management (AMB) (Signed)
Chronic Care Management Pharmacy Note  06/22/2020 Name:  Santiago Graf MRN:  [id number] DOB:  11-22-1932  Subjective: Darryl Blumenstein is an 87 y.o. year old female who is a primary patient of Copland, Gay Filler, MD.  The CCM team was consulted for assistance with disease management and care coordination needs.    Engaged with patient by telephone for follow up visit in response to provider referral for pharmacy case management and/or care coordination services.   Consent to Services:  The patient was given information about Chronic Care Management services, agreed to services, and gave verbal consent prior to initiation of services.  Please see initial visit note for detailed documentation.   Patient Care Team: Copland, Gay Filler, MD as PCP - General (Family Medicine) Becky Sax, MD as Referring Physician (Orthopedic Surgery) Calvert Cantor, MD as Consulting Physician (Ophthalmology) Michael Boston, MD as Consulting Physician (General Surgery) Cherre Robins, PharmD (Pharmacist)  Recent office visits: 05/30/2020 - PCP (Dr Lorelei Pont) - Routine f/u visit. BP was 142/86. Labs checked CMP, lipids, CBC, Vitamin D, TSH and A1c; no medication changes noted.   Recent consult visits: 02/17/2020 - ENT (Dr Laurance Flatten with [facility]) - Seen for acute, recurrent maxillary sinusitis and non seasonal allergic rhinitis. Started doxycyline 158m bid for 10 days;   Hospital visits: 12/23/2019 - ED for Eye problem - subconjunctival hemorrhage of right eye. No med changes; advised to follow up with eye doctor if continues to have symptoms.   Objective:  Lab Results  Component Value Date   CREATININE 0.50 05/30/2020   CREATININE 0.62 05/28/2019   CREATININE 0.34 (L) 05/07/2018    Lab Results  Component Value Date   HGBA1C 5.6 05/30/2020   Last diabetic Eye exam: No results found for: HMDIABEYEEXA  Last diabetic Foot exam: No results found for: HMDIABFOOTEX      Component  Value Date/Time   CHOL 213 (H) 05/30/2020 1132   TRIG 90.0 05/30/2020 1132   HDL 55.00 05/30/2020 1132   CHOLHDL 4 05/30/2020 1132   VLDL 18.0 05/30/2020 1132   LDLCALC 140 (H) 05/30/2020 1132    Hepatic Function Latest Ref Rng & Units 05/30/2020 05/28/2019 10/27/2018  Total Protein 6.0 - 8.3 g/dL 7.3 7.0 7.6  Albumin 3.5 - 5.2 g/dL 4.1 3.9 -  AST 0 - 37 U/L 24 21 -  ALT 0 - 35 U/L 16 13 -  Alk Phosphatase 39 - 117 U/L 81 75 -  Total Bilirubin 0.2 - 1.2 mg/dL 0.5 0.4 -  Bilirubin, Direct 0.0 - 0.3 mg/dL - - -    Lab Results  Component Value Date/Time   TSH 1.24 05/30/2020 11:32 AM   TSH 1.400 10/27/2018 11:21 AM    CBC Latest Ref Rng & Units 05/30/2020 05/28/2019 05/04/2018  WBC 4.0 - 10.5 K/uL 7.2 8.1 10.0  Hemoglobin 12.0 - 15.0 g/dL 14.6 13.5 13.8  Hematocrit 36.0 - 46.0 % 43.0 39.0 42.7  Platelets 150.0 - 400.0 K/uL 252.0 248.0 276    Lab Results  Component Value Date/Time   VD25OH 44.74 05/30/2020 11:32 AM   VD25OH 40.7 10/27/2018 11:21 AM   VD25OH 43.97 06/01/2014 10:12 AM    Clinical ASCVD: No  The ASCVD Risk score (GEldorado, et al., 2013) failed to calculate for the following reasons:   The 2013 ASCVD risk score is only valid for ages 435to 756   Other: DEXA 12/29/2019  T-Score for forearm was -2.3  T-Score for femur was -  1.8  Frax estimate: 20%  /  5%  Social History   Tobacco Use  Smoking Status Never Smoker  Smokeless Tobacco Never Used   BP Readings from Last 3 Encounters:  05/30/20 (!) 142/86  12/23/19 (!) 160/73  05/28/19 117/69   Pulse Readings from Last 3 Encounters:  05/30/20 81  12/23/19 67  05/28/19 62   Wt Readings from Last 3 Encounters:  05/30/20 142 lb 9.6 oz (64.7 kg)  12/23/19 140 lb (63.5 kg)  05/28/19 146 lb (66.2 kg)    Assessment: Review of patient past medical history, allergies, medications, health status, including review of consultants reports, laboratory and other test data, was performed as part of comprehensive  evaluation and provision of chronic care management services.   SDOH:  (Social Determinants of Health) assessments and interventions performed:  SDOH Interventions   Flowsheet Row Most Recent Value  SDOH Interventions   Financial Strain Interventions Intervention Not Indicated  Physical Activity Interventions Intervention Not Indicated      CCM Care Plan  Allergies  Allergen Reactions  . Ibuprofen Swelling    Mouth Swelling- however pt confirms that she is able to take mobic with no problems 11/2016   . Amoxicillin Nausea And Vomiting    Did it involve swelling of the face/tongue/throat, SOB, or low BP? no Did it involve sudden or severe rash/hives, skin peeling, or any reaction on the inside of your mouth or nose? no Did you need to seek medical attention at a hospital or doctor's office? no  When did it last happen?2005 If all above answers are "NO", may proceed with cephalosporin use.     Medications Reviewed Today    Reviewed by Cherre Robins, PharmD (Pharmacist) on 06/22/20 at 70  Med List Status: <None>  Medication Order Taking? Sig Documenting Provider Last Dose Status Informant  acetaminophen-codeine (TYLENOL #3) 300-30 MG tablet [id number] No Take 1 tablet by mouth every 8 (eight) hours as needed for moderate pain. For left knee pain  Patient not taking: No sig reported   Copland, Gay Filler, MD Not Taking Active   alendronate (FOSAMAX) 70 MG tablet [id number] Yes Take 1 tablet (70 mg total) by mouth every 7 (seven) days. Take with a full glass of water on an empty stomach. Copland, Gay Filler, MD Taking Active   Calcium Carbonate-Vitamin D 600-400 MG-UNIT tablet [id number] Yes Take 1 tablet by mouth daily. 800 units vitamin D [provider] Taking Active Self           Med Note Lynita Lombard Aug 27, 2019 10:24 AM) Takes one tablet daily  fluticasone (FLONASE) 50 MCG/ACT nasal spray [id number] Yes Place 2 sprays into both nostrils daily. Copland, Gay Filler, MD Taking Active   Fluticasone-Salmeterol (ADVAIR) 250-50 MCG/DOSE AEPB [id number] Yes Inhale 1 puff into the lungs 2 (two) times daily. Copland, Gay Filler, MD Taking Active            Med Note [facility], Shealynn Saulnier B   Wed Jun 22, 2020  1:17 PM) Uses as needed   gabapentin (NEURONTIN) 300 MG capsule [id number] Yes Take 1 capsule (300 mg total) by mouth at bedtime. Frann Rider, NP Taking Active   meloxicam (MOBIC) 15 MG tablet [id number] Yes TAKE 1 TABLET(15 MG) BY MOUTH DAILY AS NEEDED FOR PAIN Copland, Gay Filler, MD Taking Active   Multiple Vitamin (MULTIVITAMIN) tablet [id number] Yes Take 1 tablet by mouth daily. [provider] Taking Active Self  omega-3 fish oil (MAXEPA) 1000  MG CAPS capsule [id number] Yes Take 1 capsule by mouth daily.  [provider] Taking Active Self  OVER THE COUNTER MEDICATION [id number] Yes Apply 1 application topically as needed (neuropathy/feet). OTC Pain relieving foot cream: Magnilife DB [provider] Taking Active Self  polyethylene glycol (MIRALAX / GLYCOLAX) packet [id number] Yes Take 17 g by mouth daily.  Patient taking differently: Take 17 g by mouth daily as needed.   Maczis, Barth Kirks, PA-C Taking Active   polyvinyl alcohol (LIQUIFILM TEARS) 1.4 % ophthalmic solution [id number] Yes Place 1 drop into both eyes as needed for dry eyes. [provider] Taking Active Self  traZODone (DESYREL) 100 MG tablet [id number] Yes Take 1 tablet (100 mg total) by mouth daily as needed. Use for insomnia Copland, Gay Filler, MD Taking Active   Trolamine Salicylate (ASPERCREME EX) [id number] Yes Apply topically daily as needed. [provider] Taking Active   UNABLE TO FIND [id number] Yes Allergy shots weekly, Dr. Laurance Flatten. [provider] Taking Active           Patient Active Problem List   Diagnosis Date Noted  . Scoliosis, levoconvex 05/04/2018  . Strangulated left femoral hernia s/p lap SB resction & hernia repair 05/04/2018  05/04/2018  . SBO (small bowel obstruction) ([place]) 05/04/2018  . Fibroid uterus 05/04/2018  . Abdominal wall asymmetry 05/04/2018  . Diverticulosis of left colon 05/04/2018  . Hyperglycemia 05/04/2018  . Spinal stenosis   . Rotator cuff tendinitis, left 01/03/2018  . History of excessive cerumen 12/04/2017  . Chronic pain of left knee 10/01/2017  . Fall at home 05/03/2017  . Lesion of skin of breast 12/12/2015  . HOH (hard of hearing) 12/08/2014  . Hyperlipidemia 06/04/2014  . Buckle type impacted fracture of the distal left radial metaphysis 05/04/2014  . Closed nondisplaced fracture of styloid process of left ulna 05/04/2014  . Chronic allergic rhinitis 04/15/2014  . COPD with chronic bronchitis ([place]) 11/02/2013  . Osteopenia 11/02/2013  . Gait instability 11/02/2013    Immunization History  Administered Date(s) Administered  . Fluad Quad(high Dose 65+) 11/26/2018, 11/27/2019  . Influenza, High Dose Seasonal PF 11/30/2013, 12/17/2016, 12/09/2017  . Influenza,inj,Quad PF,6+ Mos 12/12/2015  . PFIZER(Purple Top)SARS-COV-2 Vaccination 04/12/2019, 05/06/2019, 12/19/2019  . Pneumococcal Conjugate-13 06/01/2014  . Pneumococcal Polysaccharide-23 12/12/2015  . Td 05/02/2020  . Zoster 03/05/2010  . Zoster Recombinat (Shingrix) 01/11/2020, 04/08/2020    Conditions to be addressed/monitored: COPD and neuropathy; insomnia; allergic rhinitis; osteopenia  Care Plan : General Pharmacy (Adult)  Updates made by Cherre Robins, PHARMD since 06/22/2020 12:00 AM    Problem: CHL AMB "PATIENT-SPECIFIC PROBLEM"   Priority: Medium  Onset Date: 06/22/2020  Note:    Current Barriers:  . Chronic Disease Management support, education, and care coordination needs related to COPD, Hyperlipidemia, Osteopenia, Allergic Rhinitis, Chronic Pain   Hyperlipidemia . Current regimen:  . Omega 3 Fish Oil 1000 mg daily   . Interventions: o Discussed the importance of diet and exercise to reduce  LDL o Continue daily exercise and limit saturated fat intake o Continue Omega 3 Fish Oil  Allergies . Current regimen:  . Flonase Nasal spray 50 mcg/act 2 spray daily as needed . Weekly allergy injecitons . Patient self care activities - Over the next 90 days, patient will: o Continue to follow up with Dr Laurance Flatten / ENT o Continue current regimen for allergies  Osteopenia . Current regimen:  o Calcium Carbonate-Vitamin D 685m-800u 1 tab daily  o 2 servings of dairy daily  o Muitivitamin Daily  . Interventions: o Reviewed goal of 1275m of calcium per day o Reviewed goal of 1000IU of vitamin D per day  COPD:  . Current regimen:  o Advair 250/578m - inhale 1 puff into lungs twice a day . Interventions: o Reviewed inhaler technique o Reminded to rinse mouth after each use of Advair  Health Maintenance  . Pharmacist Clinical Goal(s) o Over the next 90 days, patient will work with PharmD and providers to complete health maintenance screenings/vaccinations . Interventions: o Recommended patient receive 2nd booster for COVID 19 . Patient self care activities - Over the next 90 days, patient will: o Receive 2nd booster for COVID 19  Medication management . Pharmacist Clinical Goal(s): o Over the next 90 days, patient will work with PharmD and providers to maintain optimal medication adherence . Current pharmacy: [facility] . Interventions o Comprehensive medication review performed. o Continue current medication management strategy . Patient self care activities - Over the next 90 days, patient will: o Focus on medication adherence by filling and taking medications appropriately  o Take medications as prescribed o Report any questions or concerns to PharmD and/or provider(s)  Please see past updates related to this goal by clicking on the "Past Updates" button in the selected goal       Medication Assistance: None required.  Patient affirms current coverage meets  needs.  [id number] [place] [place] [address] [id number]: [phone]: [phone] Follow Up:  Patient agrees to Care Plan and Follow-up.  Plan: Telephone follow up appointment with care management team member scheduled for:   6 months  TaCherre RobinsPharmD Clinical Pharmacist [facility]

## 2020-06-29 DIAGNOSIS — J3089 Other allergic rhinitis: Secondary | ICD-10-CM | POA: Diagnosis not present

## 2020-07-06 DIAGNOSIS — J3089 Other allergic rhinitis: Secondary | ICD-10-CM | POA: Diagnosis not present

## 2020-07-08 ENCOUNTER — Other Ambulatory Visit: Payer: Self-pay | Admitting: Family Medicine

## 2020-07-08 DIAGNOSIS — J449 Chronic obstructive pulmonary disease, unspecified: Secondary | ICD-10-CM

## 2020-07-11 DIAGNOSIS — J3089 Other allergic rhinitis: Secondary | ICD-10-CM | POA: Diagnosis not present

## 2020-07-14 DIAGNOSIS — J3089 Other allergic rhinitis: Secondary | ICD-10-CM | POA: Diagnosis not present

## 2020-07-18 ENCOUNTER — Encounter: Payer: Self-pay | Admitting: Family Medicine

## 2020-07-19 MED ORDER — NIRMATRELVIR/RITONAVIR (PAXLOVID)TABLET: 3.0000 | ORAL_TABLET | Freq: Two times a day (BID) | ORAL | 0 refills | Status: AC

## 2020-08-03 DIAGNOSIS — J3089 Other allergic rhinitis: Secondary | ICD-10-CM | POA: Diagnosis not present

## 2020-08-10 DIAGNOSIS — J3089 Other allergic rhinitis: Secondary | ICD-10-CM | POA: Diagnosis not present

## 2020-08-17 DIAGNOSIS — J3089 Other allergic rhinitis: Secondary | ICD-10-CM | POA: Diagnosis not present

## 2020-08-24 DIAGNOSIS — J3089 Other allergic rhinitis: Secondary | ICD-10-CM | POA: Diagnosis not present

## 2020-08-31 DIAGNOSIS — J3089 Other allergic rhinitis: Secondary | ICD-10-CM | POA: Diagnosis not present

## 2020-09-07 ENCOUNTER — Telehealth: Payer: Self-pay | Admitting: Pharmacist

## 2020-09-07 DIAGNOSIS — J3089 Other allergic rhinitis: Secondary | ICD-10-CM | POA: Diagnosis not present

## 2020-09-07 NOTE — Chronic Care Management (AMB) (Signed)
    Chronic Care Management Pharmacy Assistant   Name: Tanya Harris  MRN: [id number] DOB: 10/13/1932  Reason for Encounter: Disease State General    Recent office visits:  None noted  Recent consult visits:  None noted  Hospital visits:  None in previous 6 months  Medications: Outpatient Encounter Medications as of 09/07/2020  Medication Sig Note   acetaminophen-codeine (TYLENOL #3) 300-30 MG tablet Take 1 tablet by mouth every 8 (eight) hours as needed for moderate pain. For left knee pain (Patient not taking: No sig reported)    alendronate (FOSAMAX) 70 MG tablet Take 1 tablet (70 mg total) by mouth every 7 (seven) days. Take with a full glass of water on an empty stomach.    Calcium Carbonate-Vitamin D 600-400 MG-UNIT tablet Take 1 tablet by mouth daily. 800 units vitamin D 08/27/2019: Takes one tablet daily   fluticasone (FLONASE) 50 MCG/ACT nasal spray Place 2 sprays into both nostrils daily.    fluticasone-salmeterol (WIXELA INHUB) 250-50 MCG/ACT AEPB Inhale 1 puff into the lungs in the morning and at bedtime.    gabapentin (NEURONTIN) 300 MG capsule Take 1 capsule (300 mg total) by mouth at bedtime.    meloxicam (MOBIC) 15 MG tablet TAKE 1 TABLET(15 MG) BY MOUTH DAILY AS NEEDED FOR PAIN    Multiple Vitamin (MULTIVITAMIN) tablet Take 1 tablet by mouth daily.    omega-3 fish oil (MAXEPA) 1000 MG CAPS capsule Take 1 capsule by mouth daily.     OVER THE COUNTER MEDICATION Apply 1 application topically as needed (neuropathy/feet). OTC Pain relieving foot cream: Magnilife DB    polyethylene glycol (MIRALAX / GLYCOLAX) packet Take 17 g by mouth daily. (Patient taking differently: Take 17 g by mouth daily as needed.)    polyvinyl alcohol (LIQUIFILM TEARS) 1.4 % ophthalmic solution Place 1 drop into both eyes as needed for dry eyes.    traZODone (DESYREL) 100 MG tablet Take 1 tablet (100 mg total) by mouth daily as needed. Use for insomnia    Trolamine Salicylate (ASPERCREME EX) Apply  topically daily as needed.    UNABLE TO FIND Allergy shots weekly, Dr. Laurance Flatten.    No facility-administered encounter medications on file as of 09/07/2020.   Have you had any problems recently with your health? Patient states she is not having any new problems with her health.  Have you had any problems with your pharmacy? Patient states she is not having any problems with her pharmacy.  What issues or side effects are you having with your medications? Patient states she is not having any issues or side effects with her medications.  What would you like me to pass along to [place] for them to help you with?  Patient states there is nothing at this time.  What can we do to take care of you better? Patient states there is nothing at this time but appreciates the call.  Star Rating Drugs: None noted  [facility] Clinical Pharmacist Assistant [phone]

## 2020-09-14 DIAGNOSIS — J301 Allergic rhinitis due to pollen: Secondary | ICD-10-CM | POA: Diagnosis not present

## 2020-09-19 DIAGNOSIS — J301 Allergic rhinitis due to pollen: Secondary | ICD-10-CM | POA: Diagnosis not present

## 2020-09-23 ENCOUNTER — Ambulatory Visit: Payer: Medicare Other | Attending: Internal Medicine

## 2020-09-23 DIAGNOSIS — Z23 Encounter for immunization: Secondary | ICD-10-CM

## 2020-09-23 NOTE — Progress Notes (Signed)
   Covid-19 Vaccination Clinic  Name:  Tanya Harris    MRN: [id number] DOB: [birth year]  09/23/2020  Tanya Harris was observed post Covid-19 immunization for 15 minutes without incident. She was provided with Vaccine Information Sheet and instruction to access the V-Safe system.   Tanya Harris was instructed to call 911 with any severe reactions post vaccine: Difficulty breathing  Swelling of face and throat  A fast heartbeat  A bad rash all over body  Dizziness and weakness   Immunizations Administered     Name Date Dose VIS Date Route   PFIZER Comrnaty(Gray TOP) Covid-19 Vaccine 09/23/2020  9:13 AM 0.3 mL 02/11/2020 Intramuscular   Manufacturer: [place]   Lot: [id number]   [place]: [phone]

## 2020-09-26 ENCOUNTER — Other Ambulatory Visit (HOSPITAL_BASED_OUTPATIENT_CLINIC_OR_DEPARTMENT_OTHER): Payer: Self-pay

## 2020-09-26 DIAGNOSIS — Z23 Encounter for immunization: Secondary | ICD-10-CM | POA: Diagnosis not present

## 2020-09-26 MED ORDER — COVID-19 MRNA VAC-TRIS(PFIZER) 30 MCG/0.3ML IM SUSP
INTRAMUSCULAR | 0 refills | Status: DC
Start: 2020-09-23 — End: 2020-11-27
  Filled 2020-09-26: qty 0.3, 1d supply, fill #0

## 2020-09-28 DIAGNOSIS — J301 Allergic rhinitis due to pollen: Secondary | ICD-10-CM | POA: Diagnosis not present

## 2020-10-05 DIAGNOSIS — J301 Allergic rhinitis due to pollen: Secondary | ICD-10-CM | POA: Diagnosis not present

## 2020-10-06 DIAGNOSIS — J301 Allergic rhinitis due to pollen: Secondary | ICD-10-CM | POA: Diagnosis not present

## 2020-10-12 DIAGNOSIS — J301 Allergic rhinitis due to pollen: Secondary | ICD-10-CM | POA: Diagnosis not present

## 2020-10-19 DIAGNOSIS — J301 Allergic rhinitis due to pollen: Secondary | ICD-10-CM | POA: Diagnosis not present

## 2020-10-20 DIAGNOSIS — J301 Allergic rhinitis due to pollen: Secondary | ICD-10-CM | POA: Diagnosis not present

## 2020-10-26 DIAGNOSIS — J301 Allergic rhinitis due to pollen: Secondary | ICD-10-CM | POA: Diagnosis not present

## 2020-10-26 DIAGNOSIS — H6120 Impacted cerumen, unspecified ear: Secondary | ICD-10-CM | POA: Diagnosis not present

## 2020-11-02 DIAGNOSIS — J301 Allergic rhinitis due to pollen: Secondary | ICD-10-CM | POA: Diagnosis not present

## 2020-11-09 DIAGNOSIS — J301 Allergic rhinitis due to pollen: Secondary | ICD-10-CM | POA: Diagnosis not present

## 2020-11-10 ENCOUNTER — Other Ambulatory Visit (HOSPITAL_BASED_OUTPATIENT_CLINIC_OR_DEPARTMENT_OTHER): Payer: Self-pay

## 2020-11-16 DIAGNOSIS — J301 Allergic rhinitis due to pollen: Secondary | ICD-10-CM | POA: Diagnosis not present

## 2020-11-23 DIAGNOSIS — J301 Allergic rhinitis due to pollen: Secondary | ICD-10-CM | POA: Diagnosis not present

## 2020-11-27 NOTE — Progress Notes (Addendum)
Tanya Harris at [facility] ([facility]) [address] [place], [place] [address] [phone]  Date:  11/30/2020   Name:  Tanya Harris   DOB:  1932-10-18   MRN:  [id number]  PCP:  Tanya Mclean, MD    Chief Complaint: 6 month follow up (Concerns/ questions: 1. Pt says she thinks she has an issue in her urine. 2. Is it time for Dexa?/Flu shot today: scheduled for Friday/)   History of Present Illness:  Tanya Harris is an 87 y.o. very pleasant female patient who presents with the following:  Patient seen today for 85-month follow-up Most recent visit with myself was in March of this year Older lady with history of COPD, hearing loss, osteopenia, hyperlipidemia, spinal stenosis, peripheral neuropathy   She is no longer seeing Dr. Herma Harris as her spine symptoms are stable. She uses trazodone for sleep, Tylenol 3 as needed for pain  Uses a walker for ambulation She did have COVID-19 in May and was treated with Paxlovid  She drinks plenty of water She notes that it seen by her urine passes more easily than it did in the past, will seem to come out in a gush.  Otherwise no symptoms, no pain or urinary frequency.  I reassured her this is likely benign She does tend to have a dry mouth Her eyes are not dry She is using saline drops for her eyes as needed  She notes that she feels more tired and has more difficulty walking recently-her back pain will get to her and limits how long she can be on her feet However, if she take a tylenol 3 it does help -she notes that she was able to visit  2 stores instead of her usual limit of 1 after using Tylenol with codeine  Flu vaccine- will be done on Friday  COVID new booster Most recent labs from March of this year She is getting allergy shots per [facility] DEXA 1 year ago She has decided to stop mammogram Shingrix is complete  Fosamax Wixela inhaler Tylenol 3 Gabapentin 300 at  bedtime Trazodone 100 mg at bedtime  Patient Active Problem List   Diagnosis Date Noted   Scoliosis, levoconvex 05/04/2018   Strangulated left femoral hernia s/p lap SB resction & hernia repair 05/04/2018 05/04/2018   SBO (small bowel obstruction) ([place]) 05/04/2018   Fibroid uterus 05/04/2018   Abdominal wall asymmetry 05/04/2018   Diverticulosis of left colon 05/04/2018   Hyperglycemia 05/04/2018   Spinal stenosis    Rotator cuff tendinitis, left 01/03/2018   History of excessive cerumen 12/04/2017   Chronic pain of left knee 10/01/2017   Fall at home 05/03/2017   Lesion of skin of breast 12/12/2015   HOH (hard of hearing) 12/08/2014   Hyperlipidemia 06/04/2014   Buckle type impacted fracture of the distal left radial metaphysis 05/04/2014   Closed nondisplaced fracture of styloid process of left ulna 05/04/2014   Chronic allergic rhinitis 04/15/2014   COPD with chronic bronchitis ([place]) 11/02/2013   Osteopenia 11/02/2013   Gait instability 11/02/2013    Past Medical History:  Diagnosis Date   Buckle type impacted fracture of the distal left radial metaphysis 05/04/2014   Closed nondisplaced fracture of styloid process of left ulna 05/04/2014   Coccygeal contusion 05/02/2015   COPD (chronic obstructive pulmonary disease) ([place])    Mild   DJD (degenerative joint disease) of knee    Fall at home 05/04/2018  Rotator cuff tendinitis, left 01/03/2018   Scoliosis    Spinal stenosis     Past Surgical History:  Procedure Laterality Date   APPENDECTOMY  2005   CATARACT EXTRACTION     Bilateral   LAPAROSCOPY N/A 05/04/2018   Procedure: LAPAROSCOPY DIAGNOSTIC , FEMORAL  HERNIA REPAIR WITH SMALL BOWEL RESECTION WITH TAP BLOCK;  Surgeon: Tanya Boston, MD;  Location: [facility];  Service: General;  Laterality: N/A;   NASAL SINUS SURGERY     TONSILLECTOMY     TOTAL KNEE ARTHROPLASTY  2001   Left    Social History   Tobacco Use   Smoking status: Never   Smokeless tobacco: Never  Vaping  Use   Vaping Use: Never used  Substance Use Topics   Alcohol use: No   Drug use: Never    Family History  Problem Relation Age of Onset   Heart disease Mother 38       Deceased   Diabetes Mother    Heart disease Father 32       Deceased   Asthma Father    Heart disease Maternal Uncle    Colon cancer Brother    Diabetes Brother        #2   Heart disease Brother        x2   Healthy Sister    Healthy Brother        x1   Hyperlipidemia Son        x2    Allergies  Allergen Reactions   Ibuprofen Swelling    Mouth Swelling- however pt confirms that she is able to take mobic with no problems 11/2016    Amoxicillin Nausea And Vomiting    Did it involve swelling of the face/tongue/throat, SOB, or low BP? no Did it involve sudden or severe rash/hives, skin peeling, or any reaction on the inside of your mouth or nose? no Did you need to seek medical attention at a hospital or doctor's office? no  When did it last happen?      2005 If all above answers are "NO", may proceed with cephalosporin use.     Medication list has been reviewed and updated.  Current Outpatient Medications on File Prior to Visit  Medication Sig Dispense Refill   acetaminophen-codeine (TYLENOL #3) 300-30 MG tablet Take 1 tablet by mouth every 8 (eight) hours as needed for moderate pain. For left knee pain 30 tablet 0   alendronate (FOSAMAX) 70 MG tablet Take 1 tablet (70 mg total) by mouth every 7 (seven) days. Take with a full glass of water on an empty stomach. 12 tablet 3   Calcium Carbonate-Vitamin D 600-400 MG-UNIT tablet Take 1 tablet by mouth daily. 800 units vitamin D     fluticasone (FLONASE) 50 MCG/ACT nasal spray Place 2 sprays into both nostrils daily. 16 g 11   fluticasone-salmeterol (WIXELA INHUB) 250-50 MCG/ACT AEPB Inhale 1 puff into the lungs in the morning and at bedtime. 60 each 5   gabapentin (NEURONTIN) 300 MG capsule Take 1 capsule (300 mg total) by mouth at bedtime. 90 capsule 3    meloxicam (MOBIC) 15 MG tablet TAKE 1 TABLET(15 MG) BY MOUTH DAILY AS NEEDED FOR PAIN 90 tablet 1   Multiple Vitamin (MULTIVITAMIN) tablet Take 1 tablet by mouth daily.     omega-3 fish oil (MAXEPA) 1000 MG CAPS capsule Take 1 capsule by mouth daily.      OVER THE COUNTER MEDICATION Apply 1 application topically as needed (neuropathy/feet).  OTC Pain relieving foot cream: Magnilife DB     polyethylene glycol (MIRALAX / GLYCOLAX) packet Take 17 g by mouth daily. (Patient taking differently: Take 17 g by mouth daily as needed.) 14 each 0   polyvinyl alcohol (LIQUIFILM TEARS) 1.4 % ophthalmic solution Place 1 drop into both eyes as needed for dry eyes.     traZODone (DESYREL) 100 MG tablet Take 1 tablet (100 mg total) by mouth daily as needed. Use for insomnia 90 tablet 3   Trolamine Salicylate (ASPERCREME EX) Apply topically daily as needed.     UNABLE TO FIND Allergy shots weekly, Dr. Laurance Flatten.     No current facility-administered medications on file prior to visit.    Review of Systems:  As per HPI- otherwise negative.   Physical Examination: Vitals:   11/30/20 1014  BP: (!) 142/80  Pulse: 78  Resp: 18  Temp: (!) 97.5 F (36.4 C)  SpO2: 95%   Vitals:   11/30/20 1014  Weight: 142 lb 6.4 oz (64.6 kg)  Height: 5\' 1"  (1.549 m)   Body mass index is 26.91 kg/m. Ideal Body Weight: Weight in (lb) to have BMI = 25: 132  GEN: no acute distress.  Looks well, her normal self HEENT: Atraumatic, Normocephalic.  Ears and Nose: No external deformity. CV: RRR, No M/G/R. No JVD. No thrill. No extra heart sounds. PULM: CTA B, no wheezes, crackles, rhonchi. No retractions. No resp. distress. No accessory muscle use. ABD: S, NT, ND EXTR: No c/c/e PSYCH: Normally interactive. Conversant.  She has severe kyphosis and scoliosis of her thoracic, lumbar spines Ambulates with walker  Assessment and Plan: Spinal stenosis of cervicothoracic region  Chronic pain of left knee - Plan:  acetaminophen-codeine (TYLENOL #3) 300-30 MG tablet  Osteopenia of forearm, unspecified laterality - Plan: alendronate (FOSAMAX) 70 MG tablet  Dry mouth - Plan: Basic metabolic panel, Hemoglobin A1c  Elevated glucose - Plan: Hemoglobin A1c  Concerning dry mouth, history of elevated glucose.  Will check A1c today to rule out diabetes Discussed her urinary symptoms, no cause for alarm.  She will let me know if any changes Up-to-date on DEXA scan, refill Fosamax Discussed her pain and use of pain medications.  She has been very hesitant to use her Tylenol 3, though it does help her increase her functional capacity.  I advised her that while we continue urged caution with narcotics, it is okay to use her Tylenol 3 when needed to control severe pain   this visit occurred during the SARS-CoV-2 public health emergency.  Safety protocols were in place, including screening questions prior to the visit, additional usage of staff PPE, and extensive cleaning of exam room while observing appropriate contact time as indicated for disinfecting solutions.   Signed Lamar Blinks, MD  Received labs as below, message to pt  Results for orders placed or performed in visit on 58/52/77  Basic metabolic panel  Result Value Ref Range   Sodium 135 135 - 145 mEq/L   Potassium 4.6 3.5 - 5.1 mEq/L   Chloride 98 96 - 112 mEq/L   CO2 28 19 - 32 mEq/L   Glucose, Bld 91 70 - 99 mg/dL   BUN 15 6 - 23 mg/dL   Creatinine, Ser 0.51 0.40 - 1.20 mg/dL   GFR 84.67 >60.00 mL/min   Calcium 9.8 8.4 - 10.5 mg/dL  Hemoglobin A1c  Result Value Ref Range   Hgb A1c MFr Bld 5.7 4.6 - 6.5 %

## 2020-11-27 NOTE — Patient Instructions (Addendum)
It was great to see you again today, I will be in touch with your labs as soon as possible Ok to use the tylenol with Codeine as needed for pain in your back and knees Assuming all is well please see me in about 6 months Please get your flu shot and covid booster at your convenience

## 2020-11-28 DIAGNOSIS — J301 Allergic rhinitis due to pollen: Secondary | ICD-10-CM | POA: Diagnosis not present

## 2020-11-30 ENCOUNTER — Encounter: Payer: Self-pay | Admitting: Family Medicine

## 2020-11-30 ENCOUNTER — Other Ambulatory Visit: Payer: Self-pay

## 2020-11-30 ENCOUNTER — Ambulatory Visit (INDEPENDENT_AMBULATORY_CARE_PROVIDER_SITE_OTHER): Payer: Medicare Other | Admitting: Family Medicine

## 2020-11-30 VITALS — BP 142/80 | HR 78 | Temp 97.5°F | Resp 18 | Ht 61.0 in | Wt 142.4 lb

## 2020-11-30 DIAGNOSIS — G8929 Other chronic pain: Secondary | ICD-10-CM

## 2020-11-30 DIAGNOSIS — R7309 Other abnormal glucose: Secondary | ICD-10-CM

## 2020-11-30 DIAGNOSIS — M25562 Pain in left knee: Secondary | ICD-10-CM

## 2020-11-30 DIAGNOSIS — R682 Dry mouth, unspecified: Secondary | ICD-10-CM

## 2020-11-30 DIAGNOSIS — M85839 Other specified disorders of bone density and structure, unspecified forearm: Secondary | ICD-10-CM | POA: Diagnosis not present

## 2020-11-30 DIAGNOSIS — M4803 Spinal stenosis, cervicothoracic region: Secondary | ICD-10-CM | POA: Diagnosis not present

## 2020-11-30 LAB — BASIC METABOLIC PANEL
BUN: 15 mg/dL (ref 6–23)
CO2: 28 mEq/L (ref 19–32)
Calcium: 9.8 mg/dL (ref 8.4–10.5)
Chloride: 98 mEq/L (ref 96–112)
Creatinine, Ser: 0.51 mg/dL (ref 0.40–1.20)
GFR: 84.67 mL/min (ref >60.00)
Glucose, Bld: 91 mg/dL (ref 70–99)
Potassium: 4.6 mEq/L (ref 3.5–5.1)
Sodium: 135 mEq/L (ref 135–145)

## 2020-11-30 LAB — HEMOGLOBIN A1C: Hgb A1c MFr Bld: 5.7 % (ref 4.6–6.5)

## 2020-11-30 MED ORDER — ALENDRONATE SODIUM 70 MG PO TABS: 70.0000 mg | ORAL_TABLET | ORAL | 3 refills | Status: DC

## 2020-11-30 MED ORDER — ACETAMINOPHEN-CODEINE #3 300-30 MG PO TABS: 1.0000 | ORAL_TABLET | Freq: Three times a day (TID) | ORAL | 1 refills | Status: DC | PRN

## 2020-12-02 ENCOUNTER — Ambulatory Visit (INDEPENDENT_AMBULATORY_CARE_PROVIDER_SITE_OTHER): Payer: Medicare Other

## 2020-12-02 ENCOUNTER — Ambulatory Visit: Payer: Medicare Other | Attending: Internal Medicine

## 2020-12-02 ENCOUNTER — Other Ambulatory Visit: Payer: Self-pay

## 2020-12-02 DIAGNOSIS — Z23 Encounter for immunization: Secondary | ICD-10-CM

## 2020-12-02 NOTE — Progress Notes (Signed)
Pt tolerated well

## 2020-12-02 NOTE — Progress Notes (Signed)
   Covid-19 Vaccination Clinic  Name:  Tanya Harris    MRN: [id number] DOB: 1932/03/14  12/02/2020  [name] was observed post Covid-19 immunization for 15 minutes without incident. She was provided with Vaccine Information Sheet and instruction to access the V-Safe system.   [name] was instructed to call 911 with any severe reactions post vaccine: Difficulty breathing  Swelling of face and throat  A fast heartbeat  A bad rash all over body  Dizziness and weakness

## 2020-12-07 DIAGNOSIS — J301 Allergic rhinitis due to pollen: Secondary | ICD-10-CM | POA: Diagnosis not present

## 2020-12-13 ENCOUNTER — Other Ambulatory Visit (HOSPITAL_BASED_OUTPATIENT_CLINIC_OR_DEPARTMENT_OTHER): Payer: Self-pay

## 2020-12-13 MED ORDER — COVID-19MRNA BIVAL VACC PFIZER 30 MCG/0.3ML IM SUSP
INTRAMUSCULAR | 0 refills | Status: DC
Start: 2020-12-02 — End: 2021-05-27
  Filled 2020-12-13: qty 0.3, 1d supply, fill #0

## 2020-12-14 DIAGNOSIS — J301 Allergic rhinitis due to pollen: Secondary | ICD-10-CM | POA: Diagnosis not present

## 2020-12-15 DIAGNOSIS — J301 Allergic rhinitis due to pollen: Secondary | ICD-10-CM | POA: Diagnosis not present

## 2020-12-21 DIAGNOSIS — J301 Allergic rhinitis due to pollen: Secondary | ICD-10-CM | POA: Diagnosis not present

## 2020-12-22 ENCOUNTER — Telehealth: Payer: Medicare Other

## 2020-12-28 DIAGNOSIS — J301 Allergic rhinitis due to pollen: Secondary | ICD-10-CM | POA: Diagnosis not present

## 2021-01-04 DIAGNOSIS — J301 Allergic rhinitis due to pollen: Secondary | ICD-10-CM | POA: Diagnosis not present

## 2021-01-05 ENCOUNTER — Other Ambulatory Visit: Payer: Self-pay | Admitting: Family Medicine

## 2021-01-05 DIAGNOSIS — M85839 Other specified disorders of bone density and structure, unspecified forearm: Secondary | ICD-10-CM

## 2021-01-11 DIAGNOSIS — J301 Allergic rhinitis due to pollen: Secondary | ICD-10-CM | POA: Diagnosis not present

## 2021-01-18 DIAGNOSIS — J301 Allergic rhinitis due to pollen: Secondary | ICD-10-CM | POA: Diagnosis not present

## 2021-01-25 DIAGNOSIS — J301 Allergic rhinitis due to pollen: Secondary | ICD-10-CM | POA: Diagnosis not present

## 2021-02-01 DIAGNOSIS — J301 Allergic rhinitis due to pollen: Secondary | ICD-10-CM | POA: Diagnosis not present

## 2021-02-07 ENCOUNTER — Telehealth: Payer: Self-pay | Admitting: Family Medicine

## 2021-02-07 NOTE — Telephone Encounter (Signed)
Left message for patient to call back and schedule Medicare Annual Wellness Visit (AWV) either virtually or in office.   Last AWV ;12/17/16  please schedule at anytime with health coach

## 2021-02-08 DIAGNOSIS — J301 Allergic rhinitis due to pollen: Secondary | ICD-10-CM | POA: Diagnosis not present

## 2021-02-15 DIAGNOSIS — J301 Allergic rhinitis due to pollen: Secondary | ICD-10-CM | POA: Diagnosis not present

## 2021-02-22 DIAGNOSIS — J301 Allergic rhinitis due to pollen: Secondary | ICD-10-CM | POA: Diagnosis not present

## 2021-02-23 DIAGNOSIS — J301 Allergic rhinitis due to pollen: Secondary | ICD-10-CM | POA: Diagnosis not present

## 2021-03-01 DIAGNOSIS — J301 Allergic rhinitis due to pollen: Secondary | ICD-10-CM | POA: Diagnosis not present

## 2021-03-08 DIAGNOSIS — J301 Allergic rhinitis due to pollen: Secondary | ICD-10-CM | POA: Diagnosis not present

## 2021-03-11 DIAGNOSIS — R109 Unspecified abdominal pain: Secondary | ICD-10-CM | POA: Diagnosis not present

## 2021-03-11 DIAGNOSIS — R194 Change in bowel habit: Secondary | ICD-10-CM | POA: Diagnosis not present

## 2021-03-13 DIAGNOSIS — J301 Allergic rhinitis due to pollen: Secondary | ICD-10-CM | POA: Diagnosis not present

## 2021-03-22 DIAGNOSIS — J301 Allergic rhinitis due to pollen: Secondary | ICD-10-CM | POA: Diagnosis not present

## 2021-03-27 ENCOUNTER — Telehealth: Payer: Self-pay | Admitting: Family Medicine

## 2021-03-27 NOTE — Telephone Encounter (Signed)
Left message for patient to call back and schedule Medicare Annual Wellness Visit (AWV) in office.   If not able to come in office, please offer to do virtually or by telephone.  Left office number and my jabber [phone].  Last AWV:12/17/2016  Please schedule at anytime with Nurse Health Advisor.

## 2021-03-29 ENCOUNTER — Telehealth: Payer: Self-pay | Admitting: Family Medicine

## 2021-03-29 DIAGNOSIS — J301 Allergic rhinitis due to pollen: Secondary | ICD-10-CM | POA: Diagnosis not present

## 2021-03-29 NOTE — Telephone Encounter (Signed)
Pt states she usually gets allergy shots at Dr. Tawanna Sat office but it is too far to drive. She was wondering if Dr. Lorelei Pont could help her find somewhere closer. Also informed pt of our transportation services. Please advise.

## 2021-03-29 NOTE — Telephone Encounter (Signed)
Please advise 

## 2021-03-30 NOTE — Telephone Encounter (Signed)
Called pt back- she is seeing Dr Hassell Done in [facility] for her allergy shots.  Advised that we are not able to give outside allergy shots here unfortunately.  She states understanding and will let us know if anything else is needed

## 2021-04-04 ENCOUNTER — Ambulatory Visit: Payer: Medicare Other | Admitting: Pharmacist

## 2021-04-04 DIAGNOSIS — E7800 Pure hypercholesterolemia, unspecified: Secondary | ICD-10-CM

## 2021-04-04 DIAGNOSIS — M85839 Other specified disorders of bone density and structure, unspecified forearm: Secondary | ICD-10-CM

## 2021-04-04 NOTE — Chronic Care Management (AMB) (Signed)
Chronic Care Management Pharmacy Note  04/04/2021 Name:  Tanya Harris MRN:  [id number] DOB:  [birth year]  Subjective: Krizia Flight is an 88 y.o. year old female who is a primary patient of Copland, Gay Filler, MD.  The CCM team was consulted for assistance with disease management and care coordination needs.    Note opened to document Chronic Care Management case closure . Patient is currently determined to be low risk. Chronic Care Management goals have been met.   Consent to Services:  The patient was given information about Chronic Care Management services, agreed to services, and gave verbal consent prior to initiation of services.  Please see initial visit note for detailed documentation.   Patient Care Team: Copland, Gay Filler, MD as PCP - General (Family Medicine) Becky Sax, MD as Referring Physician (Orthopedic Surgery) Calvert Cantor, MD as Consulting Physician (Ophthalmology) Michael Boston, MD as Consulting Physician (General Surgery) Cherre Robins, RPH-CPP (Pharmacist)  Recent office visits: 05/30/2020 - PCP (Dr Lorelei Pont) - Routine f/u visit. BP was 142/86. Labs checked CMP, lipids, CBC, Vitamin D, TSH and A1c; no medication changes noted.   Recent consult visits: 02/17/2020 - ENT (Dr Laurance Flatten with [facility]) - Seen for acute, recurrent maxillary sinusitis and non seasonal allergic rhinitis. Started doxycyline 124m bid for 10 days;   Hospital visits: 12/23/2019 - ED for Eye problem - subconjunctival hemorrhage of right eye. No med changes; advised to follow up with eye doctor if continues to have symptoms.   Objective:  Lab Results  Component Value Date   CREATININE 0.51 11/30/2020   CREATININE 0.50 05/30/2020   CREATININE 0.62 05/28/2019    Lab Results  Component Value Date   HGBA1C 5.7 11/30/2020   Last diabetic Eye exam: No results found for: HMDIABEYEEXA  Last diabetic Foot exam: No results found for: HMDIABFOOTEX      Component  Value Date/Time   CHOL 213 (H) 05/30/2020 1132   TRIG 90.0 05/30/2020 1132   HDL 55.00 05/30/2020 1132   CHOLHDL 4 05/30/2020 1132   VLDL 18.0 05/30/2020 1132   LDLCALC 140 (H) 05/30/2020 1132    Hepatic Function Latest Ref Rng & Units 05/30/2020 05/28/2019 10/27/2018  Total Protein 6.0 - 8.3 g/dL 7.3 7.0 7.6  Albumin 3.5 - 5.2 g/dL 4.1 3.9 -  AST 0 - 37 U/L 24 21 -  ALT 0 - 35 U/L 16 13 -  Alk Phosphatase 39 - 117 U/L 81 75 -  Total Bilirubin 0.2 - 1.2 mg/dL 0.5 0.4 -  Bilirubin, Direct 0.0 - 0.3 mg/dL - - -    Lab Results  Component Value Date/Time   TSH 1.24 05/30/2020 11:32 AM   TSH 1.400 10/27/2018 11:21 AM    CBC Latest Ref Rng & Units 05/30/2020 05/28/2019 05/04/2018  WBC 4.0 - 10.5 K/uL 7.2 8.1 10.0  Hemoglobin 12.0 - 15.0 g/dL 14.6 13.5 13.8  Hematocrit 36.0 - 46.0 % 43.0 39.0 42.7  Platelets 150.0 - 400.0 K/uL 252.0 248.0 276    Lab Results  Component Value Date/Time   VD25OH 44.74 05/30/2020 11:32 AM   VD25OH 40.7 10/27/2018 11:21 AM   VD25OH 43.97 06/01/2014 10:12 AM    Clinical ASCVD: No  The ASCVD Risk score (Arnett DK, et al., 2019) failed to calculate for the following reasons:   The 2019 ASCVD risk score is only valid for ages 472to 738   Other: DEXA 12/29/2019  T-Score for forearm was -2.3  T-Score for femur  was -1.8  Frax estimate: 20%  /  5%  Social History   Tobacco Use  Smoking Status Never  Smokeless Tobacco Never   BP Readings from Last 3 Encounters:  11/30/20 (!) 142/80  05/30/20 (!) 142/86  12/23/19 (!) 160/73   Pulse Readings from Last 3 Encounters:  11/30/20 78  05/30/20 81  12/23/19 67   Wt Readings from Last 3 Encounters:  11/30/20 142 lb 6.4 oz (64.6 kg)  05/30/20 142 lb 9.6 oz (64.7 kg)  12/23/19 140 lb (63.5 kg)    Assessment: Review of patient past medical history, allergies, medications, health status, including review of consultants reports, laboratory and other test data, was performed as part of comprehensive  evaluation and provision of chronic care management services.   SDOH:  (Social Determinants of Health) assessments and interventions performed:     CCM Care Plan  Allergies  Allergen Reactions   Ibuprofen Swelling    Mouth Swelling- however pt confirms that she is able to take mobic with no problems 11/2016    Amoxicillin Nausea And Vomiting    Did it involve swelling of the face/tongue/throat, SOB, or low BP? no Did it involve sudden or severe rash/hives, skin peeling, or any reaction on the inside of your mouth or nose? no Did you need to seek medical attention at a hospital or doctor's office? no  When did it last happen?      2005 If all above answers are NO, may proceed with cephalosporin use.     Medications Reviewed Today     Reviewed by Cherre Robins, PharmD (Pharmacist) on 06/22/20 at 44  Med List Status: <None>   Medication Order Taking? Sig Documenting Provider Last Dose Status Informant  acetaminophen-codeine (TYLENOL #3) 300-30 MG tablet [id number] No Take 1 tablet by mouth every 8 (eight) hours as needed for moderate pain. For left knee pain  Patient not taking: No sig reported   Copland, Gay Filler, MD Not Taking Active   alendronate (FOSAMAX) 70 MG tablet [id number] Yes Take 1 tablet (70 mg total) by mouth every 7 (seven) days. Take with a full glass of water on an empty stomach. Copland, Gay Filler, MD Taking Active   Calcium Carbonate-Vitamin D 600-400 MG-UNIT tablet [id number] Yes Take 1 tablet by mouth daily. 800 units vitamin D [provider] Taking Active Self           Med Note Lynita Lombard Aug 27, 2019 10:24 AM) Takes one tablet daily  fluticasone (FLONASE) 50 MCG/ACT nasal spray [id number] Yes Place 2 sprays into both nostrils daily. Copland, Gay Filler, MD Taking Active   Fluticasone-Salmeterol (ADVAIR) 250-50 MCG/DOSE AEPB [id number] Yes Inhale 1 puff into the lungs 2 (two) times daily. Copland, Gay Filler, MD Taking Active            Med  Note [facility], Azara Gemme B   Wed Jun 22, 2020  1:17 PM) Uses as needed   gabapentin (NEURONTIN) 300 MG capsule [id number] Yes Take 1 capsule (300 mg total) by mouth at bedtime. Frann Rider, NP Taking Active   meloxicam (MOBIC) 15 MG tablet [id number] Yes TAKE 1 TABLET(15 MG) BY MOUTH DAILY AS NEEDED FOR PAIN Copland, Gay Filler, MD Taking Active   Multiple Vitamin (MULTIVITAMIN) tablet [id number] Yes Take 1 tablet by mouth daily. [provider] Taking Active Self  omega-3 fish oil (MAXEPA) 1000 MG CAPS capsule [id number] Yes Take 1 capsule by mouth daily.  [provider] Taking Active Self  OVER THE COUNTER MEDICATION [id number] Yes Apply 1 application topically as needed (neuropathy/feet). OTC Pain relieving foot cream: Magnilife DB [provider] Taking Active Self  polyethylene glycol (MIRALAX / GLYCOLAX) packet [id number] Yes Take 17 g by mouth daily.  Patient taking differently: Take 17 g by mouth daily as needed.   Maczis, Barth Kirks, PA-C Taking Active   polyvinyl alcohol (LIQUIFILM TEARS) 1.4 % ophthalmic solution [id number] Yes Place 1 drop into both eyes as needed for dry eyes. [provider] Taking Active Self  traZODone (DESYREL) 100 MG tablet [id number] Yes Take 1 tablet (100 mg total) by mouth daily as needed. Use for insomnia Copland, Gay Filler, MD Taking Active   Trolamine Salicylate (ASPERCREME EX) [id number] Yes Apply topically daily as needed. [provider] Taking Active   UNABLE TO FIND [id number] Yes Allergy shots weekly, Dr. Laurance Flatten. [provider] Taking Active             Patient Active Problem List   Diagnosis Date Noted   Scoliosis, levoconvex 05/04/2018   Strangulated left femoral hernia s/p lap SB resction & hernia repair 05/04/2018 05/04/2018   SBO (small bowel obstruction) ([place]) 05/04/2018   Fibroid uterus 05/04/2018   Abdominal wall asymmetry 05/04/2018   Diverticulosis of left colon 05/04/2018   Hyperglycemia  05/04/2018   Spinal stenosis    Rotator cuff tendinitis, left 01/03/2018   History of excessive cerumen 12/04/2017   Chronic pain of left knee 10/01/2017   Fall at home 05/03/2017   Lesion of skin of breast 12/12/2015   HOH (hard of hearing) 12/08/2014   Hyperlipidemia 06/04/2014   Buckle type impacted fracture of the distal left radial metaphysis 05/04/2014   Closed nondisplaced fracture of styloid process of left ulna 05/04/2014   Chronic allergic rhinitis 04/15/2014   COPD with chronic bronchitis ([place]) 11/02/2013   Osteopenia 11/02/2013   Gait instability 11/02/2013    Immunization History  Administered Date(s) Administered   Fluad Quad(high Dose 65+) 11/26/2018, 11/27/2019, 12/02/2020   Influenza, High Dose Seasonal PF 11/30/2013, 12/17/2016, 12/09/2017   Influenza,inj,Quad PF,6+ Mos 12/12/2015   PFIZER Comirnaty(Gray Top)Covid-19 Tri-Sucrose Vaccine 09/23/2020   PFIZER(Purple Top)SARS-COV-2 Vaccination 04/12/2019, 05/06/2019, 12/19/2019   Pfizer Covid-19 Vaccine Bivalent Booster 25yr & up 12/02/2020   Pneumococcal Conjugate-13 06/01/2014   Pneumococcal Polysaccharide-23 12/12/2015   Td 05/02/2020   Zoster Recombinat (Shingrix) 01/11/2020, 04/08/2020   Zoster, Live 03/05/2010    Conditions to be addressed/monitored: COPD and neuropathy; insomnia; allergic rhinitis; osteopenia  There are no care plans that you recently modified to display for this patient.    Medication Assistance: None required.  Patient affirms current coverage meets needs.  [id number] [place] [place] - [address] [id number]: [phone]: [phone]  Follow Up:  not needed  Plan: No further follow up required: at this time.  TCherre Robins PharmD Clinical Pharmacist [facility]

## 2021-04-05 DIAGNOSIS — J301 Allergic rhinitis due to pollen: Secondary | ICD-10-CM | POA: Diagnosis not present

## 2021-04-07 ENCOUNTER — Ambulatory Visit (INDEPENDENT_AMBULATORY_CARE_PROVIDER_SITE_OTHER): Payer: Medicare Other

## 2021-04-07 VITALS — Ht 61.0 in | Wt 133.0 lb

## 2021-04-07 DIAGNOSIS — Z0000 Encounter for general adult medical examination without abnormal findings: Secondary | ICD-10-CM

## 2021-04-07 NOTE — Patient Instructions (Signed)
Tanya Harris , Thank you for taking time to complete your Medicare Wellness Visit. I appreciate your ongoing commitment to your health goals. Please review the following plan we discussed and let me know if I can assist you in the future.   Screening recommendations/referrals: Colonoscopy: No longer required Mammogram: Declined Bone Density: Completed 12/29/2019-Due 12/28/2021 Recommended yearly ophthalmology/optometry visit for glaucoma screening and checkup Recommended yearly dental visit for hygiene and checkup  Vaccinations: Influenza vaccine: Up to date Pneumococcal vaccine: Up to date Tdap vaccine: Up to date Shingles vaccine: Completed vaccines Covid-19:Up to date  Advanced directives: Copy in chart  Conditions/risks identified: See problem list  Next appointment: Follow up in one year for your annual wellness visit    Preventive Care 35 Years and Older, Female Preventive care refers to lifestyle choices and visits with your health care provider that can promote health and wellness. What does preventive care include? A yearly physical exam. This is also called an annual well check. Dental exams once or twice a year. Routine eye exams. Ask your health care provider how often you should have your eyes checked. Personal lifestyle choices, including: Daily care of your teeth and gums. Regular physical activity. Eating a healthy diet. Avoiding tobacco and drug use. Limiting alcohol use. Practicing safe sex. Taking low-dose aspirin every day. Taking vitamin and mineral supplements as recommended by your health care provider. What happens during an annual well check? The services and screenings done by your health care provider during your annual well check will depend on your age, overall health, lifestyle risk factors, and family history of disease. Counseling  Your health care provider may ask you questions about your: Alcohol use. Tobacco use. Drug use. Emotional  well-being. Home and relationship well-being. Sexual activity. Eating habits. History of falls. Memory and ability to understand (cognition). Work and work Statistician. Reproductive health. Screening  You may have the following tests or measurements: Height, weight, and BMI. Blood pressure. Lipid and cholesterol levels. These may be checked every 5 years, or more frequently if you are over 46 years old. Skin check. Lung cancer screening. You may have this screening every year starting at age 62 if you have a 30-pack-year history of smoking and currently smoke or have quit within the past 15 years. Fecal occult blood test (FOBT) of the stool. You may have this test every year starting at age 58. Flexible sigmoidoscopy or colonoscopy. You may have a sigmoidoscopy every 5 years or a colonoscopy every 10 years starting at age 72. Hepatitis C blood test. Hepatitis B blood test. Sexually transmitted disease (STD) testing. Diabetes screening. This is done by checking your blood sugar (glucose) after you have not eaten for a while (fasting). You may have this done every 1-3 years. Bone density scan. This is done to screen for osteoporosis. You may have this done starting at age 78. Mammogram. This may be done every 1-2 years. Talk to your health care provider about how often you should have regular mammograms. Talk with your health care provider about your test results, treatment options, and if necessary, the need for more tests. Vaccines  Your health care provider may recommend certain vaccines, such as: Influenza vaccine. This is recommended every year. Tetanus, diphtheria, and acellular pertussis (Tdap, Td) vaccine. You may need a Td booster every 10 years. Zoster vaccine. You may need this after age 64. Pneumococcal 13-valent conjugate (PCV13) vaccine. One dose is recommended after age 32. Pneumococcal polysaccharide (PPSV23) vaccine. One dose is recommended after  age 52. Talk to your  health care provider about which screenings and vaccines you need and how often you need them. This information is not intended to replace advice given to you by your health care provider. Make sure you discuss any questions you have with your health care provider. Document Released: 03/18/2015 Document Revised: 11/09/2015 Document Reviewed: 12/21/2014 Elsevier Interactive Patient Education  2017 [place] Prevention in the Home Falls can cause injuries. They can happen to people of all ages. There are many things you can do to make your home safe and to help prevent falls. What can I do on the outside of my home? Regularly fix the edges of walkways and driveways and fix any cracks. Remove anything that might make you trip as you walk through a door, such as a raised step or threshold. Trim any bushes or trees on the path to your home. Use bright outdoor lighting. Clear any walking paths of anything that might make someone trip, such as rocks or tools. Regularly check to see if handrails are loose or broken. Make sure that both sides of any steps have handrails. Any raised decks and porches should have guardrails on the edges. Have any leaves, snow, or ice cleared regularly. Use sand or salt on walking paths during winter. Clean up any spills in your garage right away. This includes oil or grease spills. What can I do in the bathroom? Use night lights. Install grab bars by the toilet and in the tub and shower. Do not use towel bars as grab bars. Use non-skid mats or decals in the tub or shower. If you need to sit down in the shower, use a plastic, non-slip stool. Keep the floor dry. Clean up any water that spills on the floor as soon as it happens. Remove soap buildup in the tub or shower regularly. Attach bath mats securely with double-sided non-slip rug tape. Do not have throw rugs and other things on the floor that can make you trip. What can I do in the bedroom? Use night  lights. Make sure that you have a light by your bed that is easy to reach. Do not use any sheets or blankets that are too big for your bed. They should not hang down onto the floor. Have a firm chair that has side arms. You can use this for support while you get dressed. Do not have throw rugs and other things on the floor that can make you trip. What can I do in the kitchen? Clean up any spills right away. Avoid walking on wet floors. Keep items that you use a lot in easy-to-reach places. If you need to reach something above you, use a strong step stool that has a grab bar. Keep electrical cords out of the way. Do not use floor polish or wax that makes floors slippery. If you must use wax, use non-skid floor wax. Do not have throw rugs and other things on the floor that can make you trip. What can I do with my stairs? Do not leave any items on the stairs. Make sure that there are handrails on both sides of the stairs and use them. Fix handrails that are broken or loose. Make sure that handrails are as long as the stairways. Check any carpeting to make sure that it is firmly attached to the stairs. Fix any carpet that is loose or worn. Avoid having throw rugs at the top or bottom of the stairs. If you do  have throw rugs, attach them to the floor with carpet tape. Make sure that you have a light switch at the top of the stairs and the bottom of the stairs. If you do not have them, ask someone to add them for you. What else can I do to help prevent falls? Wear shoes that: Do not have high heels. Have rubber bottoms. Are comfortable and fit you well. Are closed at the toe. Do not wear sandals. If you use a stepladder: Make sure that it is fully opened. Do not climb a closed stepladder. Make sure that both sides of the stepladder are locked into place. Ask someone to hold it for you, if possible. Clearly mark and make sure that you can see: Any grab bars or handrails. First and last  steps. Where the edge of each step is. Use tools that help you move around (mobility aids) if they are needed. These include: Canes. Walkers. Scooters. Crutches. Turn on the lights when you go into a dark area. Replace any light bulbs as soon as they burn out. Set up your furniture so you have a clear path. Avoid moving your furniture around. If any of your floors are uneven, fix them. If there are any pets around you, be aware of where they are. Review your medicines with your doctor. Some medicines can make you feel dizzy. This can increase your chance of falling. Ask your doctor what other things that you can do to help prevent falls. This information is not intended to replace advice given to you by your health care provider. Make sure you discuss any questions you have with your health care provider. Document Released: 12/16/2008 Document Revised: 07/28/2015 Document Reviewed: 03/26/2014 Elsevier Interactive Patient Education  2017 [employer].

## 2021-04-07 NOTE — Progress Notes (Signed)
Subjective:   Tanya Harris is a 88 y.o. female who presents for Medicare Annual (Subsequent) preventive examination.  I connected with Takeshia today by telephone and verified that I am speaking with the correct person using two identifiers. Location patient: home Location provider: work Persons participating in the virtual visit: patient, Marine scientist.    I discussed the limitations, risks, security and privacy concerns of performing an evaluation and management service by telephone and the availability of in person appointments. I also discussed with the patient that there may be a patient responsible charge related to this service. The patient expressed understanding and verbally consented to this telephonic visit.    Interactive audio and video telecommunications were attempted between this provider and patient, however failed, due to patient having technical difficulties OR patient did not have access to video capability.  We continued and completed visit with audio only.  Some vital signs may be absent or patient reported.   Time Spent with patient on telephone encounter: 30 minutes   Review of Systems     Cardiac Risk Factors include: advanced age (>62men, >45 women);dyslipidemia     Objective:    Today's Vitals   04/07/21 1537 04/07/21 1538  Weight: 133 lb (60.3 kg)   Height: 5\' 1"  (1.549 m)   PainSc:  3    Body mass index is 25.13 kg/m.  Advanced Directives 04/07/2021 12/23/2019 05/04/2018 10/16/2017 12/17/2016 02/22/2015  Does Patient Have a Medical Advance Directive? Yes Yes No Yes Yes No  Type of Paramedic of [place];Living will - - - [place];Living will -  Does patient want to make changes to medical advance directive? - - - No - Patient declined - -  Copy of [place] in Chart? Yes - validated most recent copy scanned in chart (See row information) - - - Yes -  Would patient like information on creating a  medical advance directive? - - No - Patient declined - - No - patient declined information    Current Medications (verified) Outpatient Encounter Medications as of 04/07/2021  Medication Sig   acetaminophen-codeine (TYLENOL #3) 300-30 MG tablet Take 1 tablet by mouth every 8 (eight) hours as needed for moderate pain. For left knee pain   alendronate (FOSAMAX) 70 MG tablet TAKE 1 TABLET(70 MG) BY MOUTH EVERY 7 DAYS WITH A FULL GLASS OF WATER AND ON AN EMPTY STOMACH   Calcium Carbonate-Vitamin D 600-400 MG-UNIT tablet Take 1 tablet by mouth daily. 800 units vitamin D   fluticasone (FLONASE) 50 MCG/ACT nasal spray Place 2 sprays into both nostrils daily.   fluticasone-salmeterol (WIXELA INHUB) 250-50 MCG/ACT AEPB Inhale 1 puff into the lungs in the morning and at bedtime.   gabapentin (NEURONTIN) 300 MG capsule Take 1 capsule (300 mg total) by mouth at bedtime.   meloxicam (MOBIC) 15 MG tablet TAKE 1 TABLET(15 MG) BY MOUTH DAILY AS NEEDED FOR PAIN   Multiple Vitamin (MULTIVITAMIN) tablet Take 1 tablet by mouth daily.   omega-3 fish oil (MAXEPA) 1000 MG CAPS capsule Take 1 capsule by mouth daily.    OVER THE COUNTER MEDICATION Apply 1 application topically as needed (neuropathy/feet). OTC Pain relieving foot cream: Magnilife DB   polyethylene glycol (MIRALAX / GLYCOLAX) packet Take 17 g by mouth daily. (Patient taking differently: Take 17 g by mouth daily as needed.)   polyvinyl alcohol (LIQUIFILM TEARS) 1.4 % ophthalmic solution Place 1 drop into both eyes as needed for dry eyes.  traZODone (DESYREL) 100 MG tablet Take 1 tablet (100 mg total) by mouth daily as needed. Use for insomnia   Trolamine Salicylate (ASPERCREME EX) Apply topically daily as needed.   UNABLE TO FIND Allergy shots weekly, Dr. Laurance Flatten.   COVID-19 mRNA bivalent vaccine, Pfizer, injection Inject into the muscle. (Patient not taking: Reported on 04/07/2021)   No facility-administered encounter medications on file as of 04/07/2021.     Allergies (verified) Ibuprofen and Amoxicillin   History: Past Medical History:  Diagnosis Date   Buckle type impacted fracture of the distal left radial metaphysis 05/04/2014   Closed nondisplaced fracture of styloid process of left ulna 05/04/2014   Coccygeal contusion 05/02/2015   COPD (chronic obstructive pulmonary disease) (HCC)    Mild   DJD (degenerative joint disease) of knee    Fall at home 05/04/2018   Rotator cuff tendinitis, left 01/03/2018   Scoliosis    Spinal stenosis    Past Surgical History:  Procedure Laterality Date   APPENDECTOMY  2005   CATARACT EXTRACTION     Bilateral   LAPAROSCOPY N/A 05/04/2018   Procedure: LAPAROSCOPY DIAGNOSTIC , FEMORAL  HERNIA REPAIR WITH SMALL BOWEL RESECTION WITH TAP BLOCK;  Surgeon: Michael Boston, MD;  Location: [facility];  Service: General;  Laterality: N/A;   NASAL SINUS SURGERY     TONSILLECTOMY     TOTAL KNEE ARTHROPLASTY  2001   Left   Family History  Problem Relation Age of Onset   Heart disease Mother 43       Deceased   Diabetes Mother    Heart disease Father 35       Deceased   Asthma Father    Heart disease Maternal Uncle    Colon cancer Brother    Diabetes Brother        #2   Heart disease Brother        x2   Healthy Sister    Healthy Brother        x1   Hyperlipidemia Son        x2   Social History   Socioeconomic History   Marital status: Married    Spouse name: Not on file   Number of children: Not on file   Years of education: Not on file   Highest education level: Not on file  Occupational History   Not on file  Tobacco Use   Smoking status: Never   Smokeless tobacco: Never  Vaping Use   Vaping Use: Never used  Substance and Sexual Activity   Alcohol use: No   Drug use: Never   Sexual activity: Not on file  Other Topics Concern   Not on file  Social History Narrative   Moved from [place] to [place] in 2015   Social Determinants of Health   Financial Resource Strain: Low Risk    Difficulty  of Paying Living Expenses: Not hard at all  Food Insecurity: No Food Insecurity   Worried About Charity fundraiser in the Last Year: Never true   Arboriculturist in the Last Year: Never true  Transportation Needs: No Transportation Needs   Lack of Transportation (Medical): No   Lack of Transportation (Non-Medical): No  Physical Activity: Insufficiently Active   Days of Exercise per Week: 6 days   Minutes of Exercise per Session: 20 min  Stress: No Stress Concern Present   Feeling of Stress : Not at all  Social Connections: Moderately Integrated   Frequency of Communication  with Friends and Family: Twice a week   Frequency of Social Gatherings with Friends and Family: Twice a week   Attends Religious Services: More than 4 times per year   Active Member of [employer] or Organizations: No   Attends Music therapist: Never   Marital Status: Married    Tobacco Counseling Counseling given: Not Answered   Clinical Intake:  Pre-visit preparation completed: Yes  Pain : 0-10 Pain Score: 3  Pain Type: Chronic pain Pain Location: Foot (leg) Pain Orientation: Left Pain Descriptors / Indicators: Tingling Pain Onset: More than a month ago Pain Frequency: Constant     Nutritional Risks: Unintentional weight loss (4-5 pounds) Diabetes: No  How often do you need to have someone help you when you read instructions, pamphlets, or other written materials from your doctor or pharmacy?: 1 - Never  Diabetic?No  Interpreter Needed?: No  Information entered by :: Caroleen Hamman LPN   Activities of Daily Living In your present state of health, do you have any difficulty performing the following activities: 04/07/2021  Hearing? N  Vision? N  Difficulty concentrating or making decisions? N  Walking or climbing stairs? Y  Comment stairs  Dressing or bathing? N  Doing errands, shopping? Y  Preparing Food and eating ? N  Using the Toilet? N  In the past six months, have you  accidently leaked urine? N  Do you have problems with loss of bowel control? N  Managing your Medications? N  Managing your Finances? N  Housekeeping or managing your Housekeeping? N  Some recent data might be hidden    Patient Care Team: Copland, Gay Filler, MD as PCP - General (Family Medicine) Becky Sax, MD as Referring Physician (Orthopedic Surgery) Calvert Cantor, MD as Consulting Physician (Ophthalmology) Michael Boston, MD as Consulting Physician (General Surgery) Cherre Robins, RPH-CPP (Pharmacist)  Indicate any recent Medical Services you may have received from other than [facility] providers in the past year (date may be approximate).     Assessment:   This is a routine wellness examination for Sonna.  Hearing/Vision screen Hearing Screening - Comments:: C/o mild hearing loss Vision Screening - Comments:: Last eye exam-2 years ago-Dr. Posey Pronto  Dietary issues and exercise activities discussed: Current Exercise Habits: Home exercise routine, Type of exercise: Other - see comments (stationary bike), Time (Minutes): 20, Frequency (Times/Week): 7, Weekly Exercise (Minutes/Week): 140, Intensity: Mild   Goals Addressed               This Visit's Progress     Patient Stated     Maintain current healthy lifestyle. (pt-stated)   On track     Other     Patient Stated        Would like to walk more       Depression Screen PHQ 2/9 Scores 04/07/2021 12/23/2019 12/17/2016 05/02/2015 11/30/2013 10/22/2013  PHQ - 2 Score 0 0 0 0 0 0  Exception Documentation - - - Patient refusal - -    Fall Risk Fall Risk  04/07/2021 05/30/2020 01/21/2019 10/27/2018 10/02/2018  Falls in the past year? 0 0 0 0 0  Comment - - - - Emmi Telephone Survey: data to providers prior to load  Number falls in past yr: 0 0 - - -  Injury with Fall? 0 0 - - -  Comment - - - - -  Risk Factor Category  - - - - -  Risk for fall due to : - - - - -  Follow up Falls prevention discussed Falls evaluation completed  - - -    FALL RISK PREVENTION PERTAINING TO THE HOME:  Any stairs in or around the home? Yes  If so, are there any without handrails? No  Home free of loose throw rugs in walkways, pet beds, electrical cords, etc? Yes  Adequate lighting in your home to reduce risk of falls? Yes   ASSISTIVE DEVICES UTILIZED TO PREVENT FALLS:  Life alert? No  Use of a cane, walker or w/c? Yes walker Grab bars in the bathroom? No  Shower chair or bench in shower? No  Elevated toilet seat or a handicapped toilet? Yes elevated toilet  TIMED UP AND GO:  Was the test performed? No . Phone visit   Cognitive Function:Normal cognitive status assessed by this Nurse Health Advisor. No abnormalities found.   MMSE - Mini Mental State Exam 12/17/2016  Orientation to time 5  Orientation to Place 5  Registration 3  Attention/ Calculation 5  Recall 3  Language- name 2 objects 2  Language- repeat 1  Language- follow 3 step command 3  Language- read & follow direction 1  Write a sentence 1  Copy design 1  Total score 30        Immunizations Immunization History  Administered Date(s) Administered   Fluad Quad(high Dose 65+) 11/26/2018, 11/27/2019, 12/02/2020   Influenza, High Dose Seasonal PF 11/30/2013, 12/17/2016, 12/09/2017   Influenza,inj,Quad PF,6+ Mos 12/12/2015   PFIZER Comirnaty(Gray Top)Covid-19 Tri-Sucrose Vaccine 09/23/2020   PFIZER(Purple Top)SARS-COV-2 Vaccination 04/12/2019, 05/06/2019, 12/19/2019   Pfizer Covid-19 Vaccine Bivalent Booster 51yrs & up 12/02/2020   Pneumococcal Conjugate-13 06/01/2014   Pneumococcal Polysaccharide-23 12/12/2015   Td 05/02/2020   Zoster Recombinat (Shingrix) 01/11/2020, 04/08/2020   Zoster, Live 03/05/2010    TDAP status: Up to date  Flu Vaccine status: Up to date  Pneumococcal vaccine status: Up to date  Covid-19 vaccine status: Completed vaccines  Qualifies for Shingles Vaccine? No   Zostavax completed Yes   Shingrix Completed?:  Yes  Screening Tests Health Maintenance  Topic Date Due   TETANUS/TDAP  05/02/2030   Pneumonia Vaccine 80+ Years old  Completed   INFLUENZA VACCINE  Completed   DEXA SCAN  Completed   COVID-19 Vaccine  Completed   Zoster Vaccines- Shingrix  Completed   HPV VACCINES  Aged Out    Health Maintenance  There are no preventive care reminders to display for this patient.  Colorectal cancer screening: No longer required.   Mammogram status: No longer required due to patient declines.  Bone Density status: Completed 12/29/2019. Results reflect: Bone density results: OSTEOPENIA. Repeat every 2 years.  Lung Cancer Screening: (Low Dose CT Chest recommended if Age 69-80 years, 30 pack-year currently smoking OR have quit w/in 15years.) does not qualify.     Additional Screening:  Hepatitis C Screening: does not qualify  Vision Screening: Recommended annual ophthalmology exams for early detection of glaucoma and other disorders of the eye. Is the patient up to date with their annual eye exam?  Yes  Who is the provider or what is the name of the office in which the patient attends annual eye exams? Dr. Posey Pronto   Dental Screening: Recommended annual dental exams for proper oral hygiene  Community Resource Referral / Chronic Care Management: CRR required this visit?  No   CCM required this visit?  No      Plan:     I have personally reviewed and noted the following in the patients chart:  Medical and social history Use of alcohol, tobacco or illicit drugs  Current medications and supplements including opioid prescriptions.  Functional ability and status Nutritional status Physical activity Advanced directives List of other physicians Hospitalizations, surgeries, and ER visits in previous 12 months Vitals Screenings to include cognitive, depression, and falls Referrals and appointments  In addition, I have reviewed and discussed with patient certain preventive protocols,  quality metrics, and best practice recommendations. A written personalized care plan for preventive services as well as general preventive health recommendations were provided to patient.   Due to this being a telephonic visit, the after visit summary with patients personalized plan was offered to patient via mail or my-chart. Patient would like to access on my-chart.   Marta Antu, LPN   07/08/14  Nurse Health Advisor  Nurse Notes: None

## 2021-04-12 DIAGNOSIS — J301 Allergic rhinitis due to pollen: Secondary | ICD-10-CM | POA: Diagnosis not present

## 2021-04-17 DIAGNOSIS — J301 Allergic rhinitis due to pollen: Secondary | ICD-10-CM | POA: Diagnosis not present

## 2021-04-26 DIAGNOSIS — J301 Allergic rhinitis due to pollen: Secondary | ICD-10-CM | POA: Diagnosis not present

## 2021-05-03 DIAGNOSIS — J301 Allergic rhinitis due to pollen: Secondary | ICD-10-CM | POA: Diagnosis not present

## 2021-05-04 DIAGNOSIS — J301 Allergic rhinitis due to pollen: Secondary | ICD-10-CM | POA: Diagnosis not present

## 2021-05-10 DIAGNOSIS — J301 Allergic rhinitis due to pollen: Secondary | ICD-10-CM | POA: Diagnosis not present

## 2021-05-12 DIAGNOSIS — H04123 Dry eye syndrome of bilateral lacrimal glands: Secondary | ICD-10-CM | POA: Diagnosis not present

## 2021-05-12 DIAGNOSIS — H02824 Cysts of left upper eyelid: Secondary | ICD-10-CM | POA: Diagnosis not present

## 2021-05-17 DIAGNOSIS — J301 Allergic rhinitis due to pollen: Secondary | ICD-10-CM | POA: Diagnosis not present

## 2021-05-22 ENCOUNTER — Telehealth: Payer: Self-pay | Admitting: Adult Health

## 2021-05-22 ENCOUNTER — Other Ambulatory Visit: Payer: Self-pay

## 2021-05-22 MED ORDER — GABAPENTIN 300 MG PO CAPS: 300.0000 mg | ORAL_CAPSULE | Freq: Every day | ORAL | 3 refills | Status: DC

## 2021-05-22 NOTE — Telephone Encounter (Signed)
Pt request refill for gabapentin (NEURONTIN) 300 MG capsule at [facility] #[id number]  ?

## 2021-05-22 NOTE — Telephone Encounter (Signed)
Approved.  

## 2021-05-24 ENCOUNTER — Encounter: Payer: Self-pay | Admitting: Adult Health

## 2021-05-24 ENCOUNTER — Ambulatory Visit (INDEPENDENT_AMBULATORY_CARE_PROVIDER_SITE_OTHER): Payer: Medicare Other | Admitting: Adult Health

## 2021-05-24 VITALS — BP 151/73 | HR 78 | Ht 61.0 in | Wt 135.0 lb

## 2021-05-24 DIAGNOSIS — J301 Allergic rhinitis due to pollen: Secondary | ICD-10-CM | POA: Diagnosis not present

## 2021-05-24 DIAGNOSIS — G6289 Other specified polyneuropathies: Secondary | ICD-10-CM | POA: Diagnosis not present

## 2021-05-24 NOTE — Patient Instructions (Addendum)
No changes today - continue gabapentin '300mg'$  nightly  ? ? ? ?

## 2021-05-24 NOTE — Progress Notes (Signed)
[name] ?[address] ?[place]. [place] [address] ?[phone] ? ?     FOLLOW UP VISIT NOTE ? ?[name] ?Date of Birth:  [birth year] ?Medical Record Number:  [id number]  ? ?Referring MD: [place] ?[place] provider: Dr. Leonie Harris ? ?Reason for Referral: Neuropathy ? ? ?Chief Complaint  ?Patient presents with  ? Follow-up  ?  RM 3 alone ?Pt is well and stable, state things are about the same as last visit.   ?  ? ? ?HPI: ? ?Update 05/24/2021 JM: Patient returns for 1 year follow-up regarding neuropathy and medication management.  She has been stable since prior visit with continued benefit of gabapentin, denies side effects.  Denies any progression or changes in her neuropathy which has continued to be worse in left leg than right leg. No concerns at this time. ? ? ? ?History provided for reference purposes only ?Update 05/23/2020 JM: [name] returns for 70-monthfollow up via telephone for neuropathy and medication refill  ? ?She has been doing well since prior visit ?On gabapentin 300 mg nightly -tolerating without side effects ?Continued benefit of painful neuropathy symptoms ? ?No concerns or questions at this time ? ?Update 10/21/2019 JM: [name] being seen via virtual visit for follow-up regarding neuropathy. ?Currently on gabapentin 300 mg nightly tolerating dosage well and reports great benefit of neuropathy pain as well as improvement of her sleep at night ?Occasionally experiences increased pain in the evening or that will awaken her at night. She will use topical OTC cream with benefit ?Denies daytime symptoms ?No concerns at this time ? ?Update 05/28/2019 JM: [name] a 88year old female who is being seen today, 05/28/2019, via virtual visit for follow-up regarding neuropathy.  At prior visit, Dr. SLeonie Manrecommended increasing Topamax dosage to eventual 100 mg daily but patient unable to tolerate due to reports of increased "shakiness" and headaches therefore currently  on 50 mg nightly.  Recommend trialing 50 mg twice daily but patient declines need of daytime use as symptoms are only present at night.  She denies benefit at current dosage.  Discussion at prior visit regarding initiation of pregabalin but she reports contacting her insurance company and will be expensive for her to trial.  She has not previously tried gabapentin.  Her gait and balance have been stable.  No further concerns at this time. ? ?Update 01/21/2019 [place]: She returns for follow-up after last visit 200 months ago.  She states she is tolerating Topamax 50 mg at night quite well without side effects but is not so sure that it is helping at all.  She does take trazodone 100 mg which helps her sleep.  She is not as much bothered during the day with the paresthesias and does not take any medicine for that.  She does apply Aspercreme locally which helps only temporarily for couple of hours.  She used to find some benefit with pool therapy but she is no longer able to do that because of knee pain.  She feels her back pain has improved and Dr. RAlcide Evenerfeels she does not need vertebroplasty anymore.  Patient did undergo neuropathy panel labs at last visit which were all normal except elevated ANA but she does not have any clinical symptomatology to suggest lupus.  This is likely a lab false positive.  EMG nerve conduction study done on 12/11/2018 confirmed axonal peripheral polyneuropathy.  She continues to have paresthesias in the feet but feels her gait and balance  are okay she has had no falls or injuries.  She does use a wheeled walker at all times. ? ? Initial visit 10/27/2018 [place] is a 88 year old pleasant Caucasian lady seen today for initial office consultation visit for neuropathy.  History is obtained from the patient and her husband and review of referral notes.  Patient states that she has had bilateral tingling numbness in her feet for greater than 5 years while she was living in  [place].  Primary care physician told her that she had a peripheral neuropathy but she did not see a neurologist and did not have EMG nerve conduction studies done.  Over the years she feels the paresthesias seem to be getting worse they have more severe now and constant.  They seem to be more with.  Is having activity and at night.  Previously they used to be at the bottom of her feet but now they seem to have progressed to involve up to her cough.  She is also noticed that her balance is off she stumbles a lot and her too often catches when she is trying to walk fast.  She has had a few minor falls but none in the last 1 year.  She has been using a walker for the last 4 years but mostly due to degenerative arthritis in her back as well as still need left knee surgery.  She denies any history of bowel bladder dysfunction, severe back injury or fall.  She saw Dr. Nelva Bush for pain in the low back radiating down the left leg.  X-ray of the lumbar spine were obtained on 09/29/2018 which I personally reviewed which showed just osteopenia without any compression fracture.  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  She has not had any recent EMG nerve conduction study or MRI scan of the lumbar spine.  She has not had any lab work for reversible causes of neuropathy.  She denies history of diabetes, alcohol intake or exposure to toxic medications which can cause neuropathy. ? ? ? ? ? ? ?PMH:  ?Past Medical History:  ?Diagnosis Date  ? Buckle type impacted fracture of the distal left radial metaphysis 05/04/2014  ? Closed nondisplaced fracture of styloid process of left ulna 05/04/2014  ? Coccygeal contusion 05/02/2015  ? COPD (chronic obstructive pulmonary disease) ([place])   ? Mild  ? DJD (degenerative joint disease) of knee   ? Fall at home 05/04/2018  ? Rotator cuff tendinitis, left 01/03/2018  ? Scoliosis   ? Spinal stenosis   ? ? ?Social History:  ?Social  History  ? ?Socioeconomic History  ? Marital status: Married  ?  Spouse name: Not on file  ? Number of children: Not on file  ? Years of education: Not on file  ? Highest education level: Not on file  ?Occupational History  ? Not on file  ?Tobacco Use  ? Smoking status: Never  ? Smokeless tobacco: Never  ?Vaping Use  ? Vaping Use: Never used  ?Substance and Sexual Activity  ? Alcohol use: No  ? Drug use: Never  ? Sexual activity: Not on file  ?Other Topics Concern  ? Not on file  ?Social History Narrative  ? Moved from [place] to [place] in 2015  ? ?Social Determinants of Health  ? ?Financial Resource Strain: Low Risk   ? Difficulty of Paying Living Expenses: Not hard at all  ?Food Insecurity: No Food Insecurity  ?  Worried About Charity fundraiser in the Last Year: Never true  ? Ran Out of Food in the Last Year: Never true  ?Transportation Needs: No Transportation Needs  ? Lack of Transportation (Medical): No  ? Lack of Transportation (Non-Medical): No  ?Physical Activity: Insufficiently Active  ? Days of Exercise per Week: 6 days  ? Minutes of Exercise per Session: 20 min  ?Stress: No Stress Concern Present  ? Feeling of Stress : Not at all  ?Social Connections: Moderately Integrated  ? Frequency of Communication with Friends and Family: Twice a week  ? Frequency of Social Gatherings with Friends and Family: Twice a week  ? Attends Religious Services: More than 4 times per year  ? Active Member of Clubs or Organizations: No  ? Attends Archivist Meetings: Never  ? Marital Status: Married  ?Intimate Partner Violence: Not At Risk  ? Fear of Current or Ex-Partner: No  ? Emotionally Abused: No  ? Physically Abused: No  ? Sexually Abused: No  ? ? ?Medications:   ?Current Outpatient Medications on File Prior to Visit  ?Medication Sig Dispense Refill  ? acetaminophen-codeine (TYLENOL #3) 300-30 MG tablet Take 1 tablet by mouth every 8 (eight) hours as needed for moderate pain. For left knee pain 60 tablet 1  ?  alendronate (FOSAMAX) 70 MG tablet TAKE 1 TABLET(70 MG) BY MOUTH EVERY 7 DAYS WITH A FULL GLASS OF WATER AND ON AN EMPTY STOMACH 12 tablet 3  ? Calcium Carbonate-Vitamin D 600-400 MG-UNIT tablet Take 1 tablet by mo

## 2021-05-27 NOTE — Progress Notes (Signed)
Therapist, [facility] ?[address], [address] ?[place], [place] [address] ?[phone] ?Fax [phone] ? ?Date:  05/31/2021  ? ?Name:  Tanya Harris   DOB:  01-08-1933   MRN:  [id number] ? ?PCP:  Darreld Mclean, MD  ? ? ?Chief Complaint: 6 month follow up (Concerns/ questions: pt would like a refill on Tylenol 3) ? ? ?History of Present Illness: ? ?Tanya Harris is a 88 y.o. very pleasant female patient who presents with the following: ? ?Patient seen today for periodic follow-up ?Most recent visit with myself was in September ?Older lady with history of COPD, hearing loss, osteopenia, hyperlipidemia, spinal stenosis, peripheral neuropathy ?  ?She is no longer seeing Dr. Nelva Bush as her spine symptoms are stable. ?She had COVID last year, treated with antivirals and did okay ? ?Immunizations are up-to-date including COVID booster and Shingrix ? ?She is getting immunotherapy per [place] allergy ?Seen by neurology for follow-up of neuropathy earlier this month-noted some worsening, she is on gabapentin ?Pt does feel that gabapentin is helping her - 300 mg at bedtime.  She wonders if I can refill for her when she is due and this is fine  ? ?Chronic pain she also uses meloxicam and Tylenol 3 as needed ?She uses meloxicam every couple of days - no sx of allergy to this med ?She does use tylenol #3 on occasion and could use a refill as well  ? ?Bone density up-to-date ?Patient has decided discontinue mammogram ? ?She lives with her husband in their own home ? ?They have 2 sons- one in [place] and the other local ?2 granddaughters- no greats as of yet  ? ?Patient and her husband have both repaired their healthcare power attorney and living well.  They are grateful to be doing so well at this point in their lives ?Patient Active Problem List  ? Diagnosis Date Noted  ? Scoliosis, levoconvex 05/04/2018  ? Strangulated left femoral hernia s/p lap SB resction & hernia repair 05/04/2018  05/04/2018  ? SBO (small bowel obstruction) ([place]) 05/04/2018  ? Fibroid uterus 05/04/2018  ? Abdominal wall asymmetry 05/04/2018  ? Diverticulosis of left colon 05/04/2018  ? Hyperglycemia 05/04/2018  ? Spinal stenosis   ? Rotator cuff tendinitis, left 01/03/2018  ? History of excessive cerumen 12/04/2017  ? Chronic pain of left knee 10/01/2017  ? Fall at home 05/03/2017  ? Lesion of skin of breast 12/12/2015  ? HOH (hard of hearing) 12/08/2014  ? Hyperlipidemia 06/04/2014  ? Buckle type impacted fracture of the distal left radial metaphysis 05/04/2014  ? Closed nondisplaced fracture of styloid process of left ulna 05/04/2014  ? Chronic allergic rhinitis 04/15/2014  ? COPD with chronic bronchitis ([place]) 11/02/2013  ? Osteopenia 11/02/2013  ? Gait instability 11/02/2013  ? ? ?Past Medical History:  ?Diagnosis Date  ? Buckle type impacted fracture of the distal left radial metaphysis 05/04/2014  ? Closed nondisplaced fracture of styloid process of left ulna 05/04/2014  ? Coccygeal contusion 05/02/2015  ? COPD (chronic obstructive pulmonary disease) ([place])   ? Mild  ? DJD (degenerative joint disease) of knee   ? Fall at home 05/04/2018  ? Rotator cuff tendinitis, left 01/03/2018  ? Scoliosis   ? Spinal stenosis   ? ? ?Past Surgical History:  ?Procedure Laterality Date  ? APPENDECTOMY  2005  ? CATARACT EXTRACTION    ? Bilateral  ? LAPAROSCOPY N/A 05/04/2018  ? Procedure: LAPAROSCOPY DIAGNOSTIC , FEMORAL  HERNIA REPAIR WITH SMALL BOWEL RESECTION WITH TAP BLOCK;  Surgeon: Michael Boston, MD;  Location: [facility];  Service: General;  Laterality: N/A;  ? NASAL SINUS SURGERY    ? TONSILLECTOMY    ? TOTAL KNEE ARTHROPLASTY  2001  ? Left  ? ? ?Social History  ? ?Tobacco Use  ? Smoking status: Never  ? Smokeless tobacco: Never  ?Vaping Use  ? Vaping Use: Never used  ?Substance Use Topics  ? Alcohol use: No  ? Drug use: Never  ? ? ?Family History  ?Problem Relation Age of Onset  ? Heart disease Mother 44  ?     Deceased  ? Diabetes Mother   ?  Heart disease Father 92  ?     Deceased  ? Asthma Father   ? Heart disease Maternal Uncle   ? Colon cancer Brother   ? Diabetes Brother   ?     #2  ? Heart disease Brother   ?     x2  ? Healthy Sister   ? Healthy Brother   ?     x1  ? Hyperlipidemia Son   ?     x2  ? ? ?Allergies  ?Allergen Reactions  ? Ibuprofen Swelling  ?  Mouth Swelling- however pt confirms that she is able to take mobic with no problems 11/2016 ?  ? Amoxicillin Nausea And Vomiting  ?  Did it involve swelling of the face/tongue/throat, SOB, or low BP? no ?Did it involve sudden or severe rash/hives, skin peeling, or any reaction on the inside of your mouth or nose? no ?Did you need to seek medical attention at a hospital or doctor's office? no  ?When did it last happen?      2005 ?If all above answers are ?NO?, may proceed with cephalosporin use. ?  ? ? ?Medication list has been reviewed and updated. ? ?Current Outpatient Medications on File Prior to Visit  ?Medication Sig Dispense Refill  ? alendronate (FOSAMAX) 70 MG tablet TAKE 1 TABLET(70 MG) BY MOUTH EVERY 7 DAYS WITH A FULL GLASS OF WATER AND ON AN EMPTY STOMACH 12 tablet 3  ? Calcium Carbonate-Vitamin D 600-400 MG-UNIT tablet Take 1 tablet by mouth daily. 800 units vitamin D    ? fluticasone-salmeterol (WIXELA INHUB) 250-50 MCG/ACT AEPB Inhale 1 puff into the lungs in the morning and at bedtime. 60 each 5  ? gabapentin (NEURONTIN) 300 MG capsule Take 1 capsule (300 mg total) by mouth at bedtime. 90 capsule 3  ? Multiple Vitamin (MULTIVITAMIN) tablet Take 1 tablet by mouth daily.    ? omega-3 fish oil (MAXEPA) 1000 MG CAPS capsule Take 1 capsule by mouth daily.     ? OVER THE COUNTER MEDICATION Apply 1 application topically as needed (neuropathy/feet). OTC Pain relieving foot cream: Magnilife DB    ? polyethylene glycol (MIRALAX / GLYCOLAX) packet Take 17 g by mouth daily. (Patient taking differently: Take 17 g by mouth daily as needed.) 14 each 0  ? polyvinyl alcohol (LIQUIFILM TEARS) 1.4 %  ophthalmic solution Place 1 drop into both eyes as needed for dry eyes.    ? Trolamine Salicylate (ASPERCREME EX) Apply topically daily as needed.    ? UNABLE TO FIND Allergy shots weekly, Dr. Laurance Flatten.    ? ?No current facility-administered medications on file prior to visit.  ? ? ?Review of Systems: ? ?As per HPI- otherwise negative. ? ? ?Physical Examination: ?Vitals:  ? 05/31/21 0902  ?BP: 120/74  ?Pulse:  74  ?Resp: 18  ?Temp: 97.9 ?F (36.6 ?C)  ?SpO2: 96%  ? ?Vitals:  ? 05/31/21 0902  ?Weight: 134 lb (60.8 kg)  ?Height: '5\' 1"'$  (1.549 m)  ? ?Body mass index is 25.32 kg/m?. ?Ideal Body Weight: Weight in (lb) to have BMI = 25: 132 ? ?GEN: no acute distress.  Elderly lady, looks well.  She does have kyphosis, uses a rolling walker ?HEENT: Atraumatic, Normocephalic.  ?Ears and Nose: No external deformity. ?CV: RRR, No M/G/R. No JVD. No thrill. No extra heart sounds. ?PULM: CTA B, no wheezes, crackles, rhonchi. No retractions. No resp. distress. No accessory muscle use. ?EXTR: No c/c/e ?PSYCH: Normally interactive. Conversant.  ? ? ?Assessment and Plan: ?Spinal stenosis of cervicothoracic region ? ?Primary insomnia - Plan: traZODone (DESYREL) 100 MG tablet ? ?Primary osteoarthritis, unspecified site - Plan: meloxicam (MOBIC) 15 MG tablet ? ?Seasonal allergies - Plan: fluticasone (FLONASE) 50 MCG/ACT nasal spray ? ?Chronic pain of left knee - Plan: acetaminophen-codeine (TYLENOL #3) 300-30 MG tablet ? ?Patient seen today for follow-up. ?I offered blood work today, she declines-would like to have done in 6 months which is acceptable ?Refill Flonase for allergies ?Refilled meloxicam and Tylenol 3 which are used for joint pains as needed. ?She is also now taking gabapentin for neuropathy, notes this is helping her-I can refill her gabapentin when needed ?She takes trazodone at bedtime, takes this on a consistent basis.  I advised her since she is using this medication consistently I think it is safe for her.  However avoid  combining with Tylenol 3 ? ?Plan to visit in 6 months assuming all is well ? ?Signed ?Lamar Blinks, MD ? ?

## 2021-05-27 NOTE — Patient Instructions (Addendum)
It was great to see you again today, assuming all is well please see me in 6 months!  ? ?Continue to use your medications as needed for pain  ?

## 2021-05-31 ENCOUNTER — Ambulatory Visit (INDEPENDENT_AMBULATORY_CARE_PROVIDER_SITE_OTHER): Payer: Medicare Other | Admitting: Family Medicine

## 2021-05-31 VITALS — BP 120/74 | HR 74 | Temp 97.9°F | Resp 18 | Ht 61.0 in | Wt 134.0 lb

## 2021-05-31 DIAGNOSIS — M4803 Spinal stenosis, cervicothoracic region: Secondary | ICD-10-CM

## 2021-05-31 DIAGNOSIS — F5101 Primary insomnia: Secondary | ICD-10-CM | POA: Diagnosis not present

## 2021-05-31 DIAGNOSIS — M1991 Primary osteoarthritis, unspecified site: Secondary | ICD-10-CM | POA: Diagnosis not present

## 2021-05-31 DIAGNOSIS — J302 Other seasonal allergic rhinitis: Secondary | ICD-10-CM

## 2021-05-31 DIAGNOSIS — M25562 Pain in left knee: Secondary | ICD-10-CM | POA: Diagnosis not present

## 2021-05-31 DIAGNOSIS — J301 Allergic rhinitis due to pollen: Secondary | ICD-10-CM | POA: Diagnosis not present

## 2021-05-31 DIAGNOSIS — G8929 Other chronic pain: Secondary | ICD-10-CM

## 2021-05-31 DIAGNOSIS — J3089 Other allergic rhinitis: Secondary | ICD-10-CM | POA: Diagnosis not present

## 2021-05-31 MED ORDER — TRAZODONE HCL 100 MG PO TABS: 100.0000 mg | ORAL_TABLET | Freq: Every day | ORAL | 3 refills | Status: DC | PRN

## 2021-05-31 MED ORDER — ACETAMINOPHEN-CODEINE #3 300-30 MG PO TABS: 1.0000 | ORAL_TABLET | Freq: Three times a day (TID) | ORAL | 1 refills | Status: DC | PRN

## 2021-05-31 MED ORDER — MELOXICAM 15 MG PO TABS: ORAL_TABLET | ORAL | 1 refills | Status: DC

## 2021-05-31 MED ORDER — FLUTICASONE PROPIONATE 50 MCG/ACT NA SUSP: 2.0000 | Freq: Every day | NASAL | 11 refills | Status: DC

## 2021-06-07 DIAGNOSIS — J301 Allergic rhinitis due to pollen: Secondary | ICD-10-CM | POA: Diagnosis not present

## 2021-06-15 DIAGNOSIS — J301 Allergic rhinitis due to pollen: Secondary | ICD-10-CM | POA: Diagnosis not present

## 2021-06-21 DIAGNOSIS — J301 Allergic rhinitis due to pollen: Secondary | ICD-10-CM | POA: Diagnosis not present

## 2021-06-22 DIAGNOSIS — J301 Allergic rhinitis due to pollen: Secondary | ICD-10-CM | POA: Diagnosis not present

## 2021-06-28 DIAGNOSIS — J301 Allergic rhinitis due to pollen: Secondary | ICD-10-CM | POA: Diagnosis not present

## 2021-07-03 DIAGNOSIS — J301 Allergic rhinitis due to pollen: Secondary | ICD-10-CM | POA: Diagnosis not present

## 2021-07-12 DIAGNOSIS — J301 Allergic rhinitis due to pollen: Secondary | ICD-10-CM | POA: Diagnosis not present

## 2021-07-13 DIAGNOSIS — J301 Allergic rhinitis due to pollen: Secondary | ICD-10-CM | POA: Diagnosis not present

## 2021-07-19 DIAGNOSIS — J301 Allergic rhinitis due to pollen: Secondary | ICD-10-CM | POA: Diagnosis not present

## 2021-07-21 ENCOUNTER — Other Ambulatory Visit: Payer: Self-pay | Admitting: Family Medicine

## 2021-07-21 DIAGNOSIS — J449 Chronic obstructive pulmonary disease, unspecified: Secondary | ICD-10-CM

## 2021-07-26 DIAGNOSIS — J301 Allergic rhinitis due to pollen: Secondary | ICD-10-CM | POA: Diagnosis not present

## 2021-08-02 DIAGNOSIS — J301 Allergic rhinitis due to pollen: Secondary | ICD-10-CM | POA: Diagnosis not present

## 2021-08-09 DIAGNOSIS — J301 Allergic rhinitis due to pollen: Secondary | ICD-10-CM | POA: Diagnosis not present

## 2021-08-11 ENCOUNTER — Encounter: Payer: Self-pay | Admitting: Family Medicine

## 2021-08-11 ENCOUNTER — Ambulatory Visit (INDEPENDENT_AMBULATORY_CARE_PROVIDER_SITE_OTHER): Payer: Medicare Other | Admitting: Family Medicine

## 2021-08-11 VITALS — BP 158/80 | HR 78 | Temp 98.0°F | Resp 18 | Ht 61.0 in | Wt 130.8 lb

## 2021-08-11 DIAGNOSIS — R350 Frequency of micturition: Secondary | ICD-10-CM | POA: Diagnosis not present

## 2021-08-11 LAB — POC URINALSYSI DIPSTICK (AUTOMATED)
Bilirubin, UA: NEGATIVE
Glucose, UA: NEGATIVE
Nitrite, UA: NEGATIVE
Protein, UA: NEGATIVE
Spec Grav, UA: 1.015 (ref 1.010–1.025)
Urobilinogen, UA: 0.2 E.U./dL
pH, UA: 6 (ref 5.0–8.0)

## 2021-08-11 MED ORDER — NITROFURANTOIN MONOHYD MACRO 100 MG PO CAPS: 100.0000 mg | ORAL_CAPSULE | Freq: Two times a day (BID) | ORAL | 0 refills | Status: DC

## 2021-08-11 NOTE — Patient Instructions (Signed)
Urinary Tract Infection, Adult A urinary tract infection (UTI) is an infection of any part of the urinary tract. The urinary tract includes: The kidneys. The ureters. The bladder. The urethra. These organs make, store, and get rid of pee (urine) in the body. What are the causes? This infection is caused by germs (bacteria) in your genital area. These germs grow and cause swelling (inflammation) of your urinary tract. What increases the risk? The following factors may make you more likely to develop this condition: Using a small, thin tube (catheter) to drain pee. Not being able to control when you pee or poop (incontinence). Being female. If you are female, these things can increase the risk: Using these methods to prevent pregnancy: A medicine that kills sperm (spermicide). A device that blocks sperm (diaphragm). Having low levels of a female hormone (estrogen). Being pregnant. You are more likely to develop this condition if: You have genes that add to your risk. You are sexually active. You take antibiotic medicines. You have trouble peeing because of: A prostate that is bigger than normal, if you are female. A blockage in the part of your body that drains pee from the bladder. A kidney stone. A nerve condition that affects your bladder. Not getting enough to drink. Not peeing often enough. You have other conditions, such as: Diabetes. A weak disease-fighting system (immune system). Sickle cell disease. Gout. Injury of the spine. What are the signs or symptoms? Symptoms of this condition include: Needing to pee right away. Peeing small amounts often. Pain or burning when peeing. Blood in the pee. Pee that smells bad or not like normal. Trouble peeing. Pee that is cloudy. Fluid coming from the vagina, if you are female. Pain in the belly or lower back. Other symptoms include: Vomiting. Not feeling hungry. Feeling mixed up (confused). This may be the first symptom in  older adults. Being tired and grouchy (irritable). A fever. Watery poop (diarrhea). How is this treated? Taking antibiotic medicine. Taking other medicines. Drinking enough water. In some cases, you may need to see a specialist. Follow these instructions at home:  Medicines Take over-the-counter and prescription medicines only as told by your doctor. If you were prescribed an antibiotic medicine, take it as told by your doctor. Do not stop taking it even if you start to feel better. General instructions Make sure you: Pee until your bladder is empty. Do not hold pee for a long time. Empty your bladder after sex. Wipe from front to back after peeing or pooping if you are a female. Use each tissue one time when you wipe. Drink enough fluid to keep your pee pale yellow. Keep all follow-up visits. Contact a doctor if: You do not get better after 1-2 days. Your symptoms go away and then come back. Get help right away if: You have very bad back pain. You have very bad pain in your lower belly. You have a fever. You have chills. You feeling like you will vomit or you vomit. Summary A urinary tract infection (UTI) is an infection of any part of the urinary tract. This condition is caused by germs in your genital area. There are many risk factors for a UTI. Treatment includes antibiotic medicines. Drink enough fluid to keep your pee pale yellow. This information is not intended to replace advice given to you by your health care provider. Make sure you discuss any questions you have with your health care provider. Document Revised: 10/02/2019 Document Reviewed: 10/02/2019 Elsevier Patient Education    2023 [employer]  

## 2021-08-11 NOTE — Progress Notes (Signed)
Established Patient Office Visit  Subjective   Patient ID: Tanya Harris, female    DOB: 02-10-1933  Age: 88 y.o. MRN: [id number]  Chief Complaint  Patient presents with   Urinary Frequency    Pt states sxs have been going on for a few weeks.  Pt states no burning or abdominal pain.     HPI Pt is here c/o urinary frequency x few weeks.  No dysuria, noabd pain,  no fevers or back pain.   Patient Active Problem List   Diagnosis Date Noted   Scoliosis, levoconvex 05/04/2018   Strangulated left femoral hernia s/p lap SB resction & hernia repair 05/04/2018 05/04/2018   SBO (small bowel obstruction) ([place]) 05/04/2018   Fibroid uterus 05/04/2018   Abdominal wall asymmetry 05/04/2018   Diverticulosis of left colon 05/04/2018   Hyperglycemia 05/04/2018   Spinal stenosis    Rotator cuff tendinitis, left 01/03/2018   History of excessive cerumen 12/04/2017   Chronic pain of left knee 10/01/2017   Fall at home 05/03/2017   Lesion of skin of breast 12/12/2015   HOH (hard of hearing) 12/08/2014   Hyperlipidemia 06/04/2014   Buckle type impacted fracture of the distal left radial metaphysis 05/04/2014   Closed nondisplaced fracture of styloid process of left ulna 05/04/2014   Chronic allergic rhinitis 04/15/2014   COPD with chronic bronchitis ([place]) 11/02/2013   Osteopenia 11/02/2013   Gait instability 11/02/2013   Past Medical History:  Diagnosis Date   Buckle type impacted fracture of the distal left radial metaphysis 05/04/2014   Closed nondisplaced fracture of styloid process of left ulna 05/04/2014   Coccygeal contusion 05/02/2015   COPD (chronic obstructive pulmonary disease) (HCC)    Mild   DJD (degenerative joint disease) of knee    Fall at home 05/04/2018   Rotator cuff tendinitis, left 01/03/2018   Scoliosis    Spinal stenosis    Past Surgical History:  Procedure Laterality Date   APPENDECTOMY  2005   CATARACT EXTRACTION     Bilateral   LAPAROSCOPY N/A 05/04/2018   Procedure:  LAPAROSCOPY DIAGNOSTIC , FEMORAL  HERNIA REPAIR WITH SMALL BOWEL RESECTION WITH TAP BLOCK;  Surgeon: Michael Boston, MD;  Location: [facility];  Service: General;  Laterality: N/A;   NASAL SINUS SURGERY     TONSILLECTOMY     TOTAL KNEE ARTHROPLASTY  2001   Left   Social History   Tobacco Use   Smoking status: Never   Smokeless tobacco: Never  Vaping Use   Vaping Use: Never used  Substance Use Topics   Alcohol use: No   Drug use: Never   Social History   Socioeconomic History   Marital status: Married    Spouse name: Not on file   Number of children: Not on file   Years of education: Not on file   Highest education level: Not on file  Occupational History   Not on file  Tobacco Use   Smoking status: Never   Smokeless tobacco: Never  Vaping Use   Vaping Use: Never used  Substance and Sexual Activity   Alcohol use: No   Drug use: Never   Sexual activity: Not on file  Other Topics Concern   Not on file  Social History Narrative   Moved from [place] to [place] in 2015   Social Determinants of Health   Financial Resource Strain: Low Risk  (04/07/2021)   Overall Financial Resource Strain (CARDIA)    Difficulty of Paying Living Expenses: Not hard  at all  Food Insecurity: No Food Insecurity (04/07/2021)   Hunger Vital Sign    Worried About Running Out of Food in the Last Year: Never true    Ran Out of Food in the Last Year: Never true  Transportation Needs: No Transportation Needs (04/07/2021)   PRAPARE - Hydrologist (Medical): No    Lack of Transportation (Non-Medical): No  Physical Activity: Insufficiently Active (06/22/2020)   Exercise Vital Sign    Days of Exercise per Week: 6 days    Minutes of Exercise per Session: 20 min  Stress: No Stress Concern Present (04/07/2021)   Exline    Feeling of Stress : Not at all  Social Connections: Moderately Integrated (04/07/2021)   Social  Connection and Isolation Panel [NHANES]    Frequency of Communication with Friends and Family: Twice a week    Frequency of Social Gatherings with Friends and Family: Twice a week    Attends Religious Services: More than 4 times per year    Active Member of [employer] or Organizations: No    Attends Archivist Meetings: Never    Marital Status: Married  Human resources officer Violence: Not At Risk (04/07/2021)   Humiliation, Afraid, Rape, and Kick questionnaire    Fear of Current or Ex-Partner: No    Emotionally Abused: No    Physically Abused: No    Sexually Abused: No   Family Status  Relation Name Status   Mother  Deceased   Father  Deceased   Mat Uncle  (Not Specified)   Brother  (Not Specified)   Brother  (Not Specified)   Brother  (Not Specified)   Sister  (Not Specified)   Brother  (Not Specified)   Son  (Not Specified)   MGM  Deceased   MGF  Deceased   PGM  Deceased   PGF  Deceased   Family History  Problem Relation Age of Onset   Heart disease Mother 30       Deceased   Diabetes Mother    Heart disease Father 66       Deceased   Asthma Father    Heart disease Maternal Uncle    Colon cancer Brother    Diabetes Brother        #2   Heart disease Brother        x2   Healthy Sister    Healthy Brother        x1   Hyperlipidemia Son        x2   Allergies  Allergen Reactions   Ibuprofen Swelling    Mouth Swelling- however pt confirms that she is able to take mobic with no problems 11/2016    Amoxicillin Nausea And Vomiting    Did it involve swelling of the face/tongue/throat, SOB, or low BP? no Did it involve sudden or severe rash/hives, skin peeling, or any reaction on the inside of your mouth or nose? no Did you need to seek medical attention at a hospital or doctor's office? no  When did it last happen?      2005 If all above answers are "NO", may proceed with cephalosporin use.       Review of Systems  Constitutional:  Negative for fever and  malaise/fatigue.  HENT:  Negative for congestion.   Eyes:  Negative for blurred vision.  Respiratory:  Negative for shortness of breath.   Cardiovascular:  Negative for  chest pain, palpitations and leg swelling.  Gastrointestinal:  Negative for abdominal pain, blood in stool and nausea.  Genitourinary:  Positive for frequency. Negative for dysuria, flank pain, hematuria and urgency.  Musculoskeletal:  Negative for falls.  Skin:  Negative for rash.  Neurological:  Negative for dizziness, loss of consciousness and headaches.  Endo/Heme/Allergies:  Negative for environmental allergies.  Psychiatric/Behavioral:  Negative for depression. The patient is not nervous/anxious.       Objective:     BP (!) 158/80 (BP Location: Left Arm, Patient Position: Sitting, Cuff Size: Normal)   Pulse 78   Temp 98 F (36.7 C) (Oral)   Resp 18   Ht '5\' 1"'$  (1.549 m)   Wt 130 lb 12.8 oz (59.3 kg)   SpO2 97%   BMI 24.71 kg/m  BP Readings from Last 3 Encounters:  08/11/21 (!) 158/80  05/31/21 120/74  05/24/21 (!) 151/73   Wt Readings from Last 3 Encounters:  08/11/21 130 lb 12.8 oz (59.3 kg)  05/31/21 134 lb (60.8 kg)  05/24/21 135 lb (61.2 kg)   SpO2 Readings from Last 3 Encounters:  08/11/21 97%  05/31/21 96%  11/30/20 95%      Physical Exam Vitals and nursing note reviewed.  Constitutional:      Appearance: She is well-developed.  HENT:     Head: Normocephalic and atraumatic.  Eyes:     Conjunctiva/sclera: Conjunctivae normal.  Neck:     Thyroid: No thyromegaly.     Vascular: No carotid bruit or JVD.  Cardiovascular:     Rate and Rhythm: Normal rate and regular rhythm.     Heart sounds: Normal heart sounds. No murmur heard. Pulmonary:     Effort: Pulmonary effort is normal. No respiratory distress.     Breath sounds: Normal breath sounds. No wheezing or rales.  Chest:     Chest wall: No tenderness.  Musculoskeletal:     Cervical back: Normal range of motion and neck supple.   Neurological:     General: No focal deficit present.     Mental Status: She is alert and oriented to person, place, and time.  Psychiatric:        Mood and Affect: Mood normal.        Behavior: Behavior normal.        Thought Content: Thought content normal.      Results for orders placed or performed in visit on 08/11/21  Urine Culture   Specimen: Urine  Result Value Ref Range   MICRO NUMBER: [id number]    SPECIMEN QUALITY: Adequate    Sample Source URINE    STATUS: FINAL    Result:      Mixed genital flora isolated. These superficial bacteria are not indicative of a urinary tract infection. No further organism identification is warranted on this specimen. If clinically indicated, recollect clean-catch, mid-stream urine and transfer  immediately to Urine Culture Transport Tube.   POCT Urinalysis Dipstick (Automated)  Result Value Ref Range   Color, UA yellow    Clarity, UA hazy    Glucose, UA Negative Negative   Bilirubin, UA negative    Ketones, UA trace    Spec Grav, UA 1.015 1.010 - 1.025   Blood, UA Trace    pH, UA 6.0 5.0 - 8.0   Protein, UA Negative Negative   Urobilinogen, UA 0.2 0.2 or 1.0 E.U./dL   Nitrite, UA negative    Leukocytes, UA Small (1+) (A) Negative    Last CBC Lab  Results  Component Value Date   WBC 7.2 05/30/2020   HGB 14.6 05/30/2020   HCT 43.0 05/30/2020   MCV 89.4 05/30/2020   MCH 29.6 05/04/2018   RDW 14.0 05/30/2020   PLT 252.0 16/12/9602   Last metabolic panel Lab Results  Component Value Date   GLUCOSE 91 11/30/2020   NA 135 11/30/2020   K 4.6 11/30/2020   CL 98 11/30/2020   CO2 28 11/30/2020   BUN 15 11/30/2020   CREATININE 0.51 11/30/2020   GFRNONAA >60 05/07/2018   CALCIUM 9.8 11/30/2020   PROT 7.3 05/30/2020   ALBUMIN 4.1 05/30/2020   LABGLOB 3.7 10/27/2018   AGRATIO 1.1 10/27/2018   BILITOT 0.5 05/30/2020   ALKPHOS 81 05/30/2020   AST 24 05/30/2020   ALT 16 05/30/2020   ANIONGAP 5 05/07/2018   Last lipids Lab  Results  Component Value Date   CHOL 213 (H) 05/30/2020   HDL 55.00 05/30/2020   LDLCALC 140 (H) 05/30/2020   TRIG 90.0 05/30/2020   CHOLHDL 4 05/30/2020   Last hemoglobin A1c Lab Results  Component Value Date   HGBA1C 5.7 11/30/2020   Last thyroid functions Lab Results  Component Value Date   TSH 1.24 05/30/2020   Last vitamin D Lab Results  Component Value Date   VD25OH 44.74 05/30/2020   Last vitamin B12 and Folate Lab Results  Component Value Date   VWUJWJXB14 782 10/27/2018   FOLATE >23.6 05/16/2017      The ASCVD Risk score (Arnett DK, et al., 2019) failed to calculate for the following reasons:   The 2019 ASCVD risk score is only valid for ages 41 to 45    Assessment & Plan:   Problem List Items Addressed This Visit   None Visit Diagnoses     Urinary frequency    -  Primary   Relevant Medications   nitrofurantoin, macrocrystal-monohydrate, (MACROBID) 100 MG capsule   Other Relevant Orders   POCT Urinalysis Dipstick (Automated) (Completed)   Urine Culture (Completed)       Return if symptoms worsen or fail to improve.    Ann Held, DO

## 2021-08-12 LAB — URINE CULTURE
MICRO NUMBER:: 13506458
SPECIMEN QUALITY:: ADEQUATE

## 2021-08-16 DIAGNOSIS — J301 Allergic rhinitis due to pollen: Secondary | ICD-10-CM | POA: Diagnosis not present

## 2021-08-23 DIAGNOSIS — J301 Allergic rhinitis due to pollen: Secondary | ICD-10-CM | POA: Diagnosis not present

## 2021-08-25 ENCOUNTER — Emergency Department (HOSPITAL_BASED_OUTPATIENT_CLINIC_OR_DEPARTMENT_OTHER): Payer: Medicare Other

## 2021-08-25 ENCOUNTER — Encounter: Payer: Self-pay | Admitting: Family Medicine

## 2021-08-25 ENCOUNTER — Other Ambulatory Visit: Payer: Self-pay

## 2021-08-25 ENCOUNTER — Encounter (HOSPITAL_BASED_OUTPATIENT_CLINIC_OR_DEPARTMENT_OTHER): Payer: Self-pay

## 2021-08-25 ENCOUNTER — Emergency Department (HOSPITAL_BASED_OUTPATIENT_CLINIC_OR_DEPARTMENT_OTHER)
Admission: EM | Admit: 2021-08-25 | Discharge: 2021-08-25 | Disposition: A | Discharge status: Home / Self Care | Payer: Medicare Other | Attending: Emergency Medicine | Admitting: Emergency Medicine

## 2021-08-25 DIAGNOSIS — M25561 Pain in right knee: Secondary | ICD-10-CM | POA: Diagnosis not present

## 2021-08-25 MED ORDER — MORPHINE SULFATE 15 MG PO TABS: 7.5000 mg | ORAL_TABLET | ORAL | 0 refills | Status: DC | PRN

## 2021-08-25 MED ORDER — DICLOFENAC SODIUM 1 % EX GEL: 4.0000 g | Freq: Four times a day (QID) | CUTANEOUS | 0 refills | Status: DC

## 2021-08-25 MED ORDER — ACETAMINOPHEN 500 MG PO TABS
1000.0000 mg | ORAL_TABLET | Freq: Once | ORAL | Status: AC
  Administered 2021-08-25: 1000 mg via ORAL
  Filled 2021-08-25: qty 2

## 2021-08-25 MED ORDER — OXYCODONE HCL 5 MG PO TABS
5.0000 mg | ORAL_TABLET | Freq: Once | ORAL | Status: AC
  Administered 2021-08-25: 5 mg via ORAL
  Filled 2021-08-25: qty 1

## 2021-08-25 MED ORDER — KETOROLAC TROMETHAMINE 15 MG/ML IJ SOLN
15.0000 mg | Freq: Once | INTRAMUSCULAR | Status: AC
  Administered 2021-08-25: 15 mg via INTRAMUSCULAR
  Filled 2021-08-25: qty 1

## 2021-08-25 NOTE — ED Provider Notes (Signed)
[facility] EMERGENCY DEPARTMENT Provider Note   CSN: [id number] Arrival date & time: 08/25/21  2952     History  Chief Complaint  Patient presents with   Knee Pain    Tanya Harris is a 88 y.o. female.  88 yo F with a chief complaint of right knee pain.  This been going on for couple days.  Normally walks with a walker but has had some pain with walking with a walker.  She denies trauma to the area.  Pain mostly to the posterior aspect of the leg.  No fevers or chills.  No breaks in the skin.   Knee Pain      Home Medications Prior to Admission medications   Medication Sig Start Date End Date Taking? Authorizing Provider  diclofenac Sodium (VOLTAREN) 1 % GEL Apply 4 g topically 4 (four) times daily. 08/25/21  Yes Melene Plan, DO  morphine (MSIR) 15 MG tablet Take 0.5 tablets (7.5 mg total) by mouth every 4 (four) hours as needed for severe pain. 08/25/21  Yes Melene Plan, DO  acetaminophen-codeine (TYLENOL #3) 300-30 MG tablet Take 1 tablet by mouth every 8 (eight) hours as needed for moderate pain. For left knee pain 05/31/21   Copland, Gwenlyn Found, MD  alendronate (FOSAMAX) 70 MG tablet TAKE 1 TABLET(70 MG) BY MOUTH EVERY 7 DAYS WITH A FULL GLASS OF WATER AND ON AN EMPTY STOMACH 01/05/21   Copland, Gwenlyn Found, MD  Calcium Carbonate-Vitamin D 600-400 MG-UNIT tablet Take 1 tablet by mouth daily. 800 units vitamin D    [provider]  fluticasone (FLONASE) 50 MCG/ACT nasal spray Place 2 sprays into both nostrils daily. 05/31/21   Copland, Gwenlyn Found, MD  gabapentin (NEURONTIN) 300 MG capsule Take 1 capsule (300 mg total) by mouth at bedtime. 05/22/21   Ihor Austin, NP  meloxicam (MOBIC) 15 MG tablet TAKE 1 TABLET(15 MG) BY MOUTH DAILY AS NEEDED FOR PAIN 05/31/21   Copland, Gwenlyn Found, MD  Multiple Vitamin (MULTIVITAMIN) tablet Take 1 tablet by mouth daily.    [provider]  nitrofurantoin, macrocrystal-monohydrate, (MACROBID) 100 MG capsule Take 1 capsule  (100 mg total) by mouth 2 (two) times daily. 08/11/21   Donato Schultz, DO  omega-3 fish oil (MAXEPA) 1000 MG CAPS capsule Take 1 capsule by mouth daily.     [provider]  OVER THE COUNTER MEDICATION Apply 1 application topically as needed (neuropathy/feet). OTC Pain relieving foot cream: Magnilife DB    [provider]  polyethylene glycol (MIRALAX / GLYCOLAX) packet Take 17 g by mouth daily. Patient taking differently: Take 17 g by mouth daily as needed. 05/08/18   Maczis, Elmer Sow, PA-C  polyvinyl alcohol (LIQUIFILM TEARS) 1.4 % ophthalmic solution Place 1 drop into both eyes as needed for dry eyes.    [provider]  traZODone (DESYREL) 100 MG tablet Take 1 tablet (100 mg total) by mouth daily as needed. Use for insomnia 05/31/21   Copland, Gwenlyn Found, MD  Trolamine Salicylate (ASPERCREME EX) Apply topically daily as needed.    [provider]  UNABLE TO FIND Allergy shots weekly, Dr. Christell Constant.    [provider]  Robbie Louis 250-50 MCG/ACT AEPB INHALE 1 PUFF INTO THE LUNGS TWICE DAILY 07/21/21   Copland, Gwenlyn Found, MD      Allergies    Ibuprofen and Amoxicillin    Review of Systems   Review of Systems  Physical Exam Updated Vital Signs BP (!) 133/51 (BP  Location: Right Arm)   Pulse 74   Temp 98 F (36.7 C)   SpO2 95%  Physical Exam Vitals and nursing note reviewed.  Constitutional:      General: She is not in acute distress.    Appearance: She is well-developed. She is not diaphoretic.  HENT:     Head: Normocephalic and atraumatic.  Eyes:     Pupils: Pupils are equal, round, and reactive to light.  Cardiovascular:     Rate and Rhythm: Normal rate and regular rhythm.     Heart sounds: No murmur heard.    No friction rub. No gallop.  Pulmonary:     Effort: Pulmonary effort is normal.     Breath sounds: No wheezing or rales.  Abdominal:     General: There is no distension.     Palpations: Abdomen is soft.     Tenderness:  There is no abdominal tenderness.  Musculoskeletal:        General: Tenderness present.     Cervical back: Normal range of motion and neck supple.     Comments: Pain mostly in the attachment of the hamstrings to the lower leg.  I am able to range the knee without obvious significant tenderness.  No effusion no erythema or warmth.  Pulse motor and sensation intact distally.  No pain to the hip.  Skin:    General: Skin is warm and dry.  Neurological:     Mental Status: She is alert and oriented to person, place, and time.  Psychiatric:        Behavior: Behavior normal.     ED Results / Procedures / Treatments   Labs (all labs ordered are listed, but only abnormal results are displayed) Labs Reviewed - No data to display  EKG None  Radiology DG Knee Complete 4 Views Right  Result Date: 08/25/2021 CLINICAL DATA:  Nontraumatic posterior right knee pain beginning this morning. EXAM: RIGHT KNEE - COMPLETE 4 VIEW COMPARISON:  None Available. FINDINGS: No evidence of fracture, dislocation, or joint effusion. Chondrocalcinosis without visible erosion, likely senile. Pronounced osteopenia. IMPRESSION: No acute finding. Electronically Signed   By: Tiburcio Pea M.D.   On: 08/25/2021 07:39    Procedures Procedures    Medications Ordered in ED Medications  acetaminophen (TYLENOL) tablet 1,000 mg (1,000 mg Oral Given 08/25/21 0744)  ketorolac (TORADOL) 15 MG/ML injection 15 mg (15 mg Intramuscular Given 08/25/21 0744)  oxyCODONE (Oxy IR/ROXICODONE) immediate release tablet 5 mg (5 mg Oral Given 08/25/21 0744)    ED Course/ Medical Decision Making/ A&P                           Medical Decision Making Amount and/or Complexity of Data Reviewed Radiology: ordered.  Risk OTC drugs. Prescription drug management.   88 yo F with a chief complaints of right knee pain.  This has been going on for a couple days now.  Atraumatic.  Unlikely to be septic arthritis on exam.  We will obtain a  plain film.  Knee immobilizer.  Plain film independently turbid by me without fracture or effusion.  She has a walker at home.  PCP follow-up.  7:46 AM:  I have discussed the diagnosis/risks/treatment options with the patient and family.  Evaluation and diagnostic testing in the emergency department does not suggest an emergent condition requiring admission or immediate intervention beyond what has been performed at this time.  They will follow up with  PCP.  We also discussed returning to the ED immediately if new or worsening sx occur. We discussed the sx which are most concerning (e.g., sudden worsening pain, fever, inability to tolerate by mouth) that necessitate immediate return. Medications administered to the patient during their visit and any new prescriptions provided to the patient are listed below.  Medications given during this visit Medications  acetaminophen (TYLENOL) tablet 1,000 mg (1,000 mg Oral Given 08/25/21 0744)  ketorolac (TORADOL) 15 MG/ML injection 15 mg (15 mg Intramuscular Given 08/25/21 0744)  oxyCODONE (Oxy IR/ROXICODONE) immediate release tablet 5 mg (5 mg Oral Given 08/25/21 0744)     The patient appears reasonably screen and/or stabilized for discharge and I doubt any other medical condition or other [facility] requiring further screening, evaluation, or treatment in the ED at this time prior to discharge.          Final Clinical Impression(s) / ED Diagnoses Final diagnoses:  Acute pain of right knee    Rx / DC Orders ED Discharge Orders          Ordered    diclofenac Sodium (VOLTAREN) 1 % GEL  4 times daily        08/25/21 0746    morphine (MSIR) 15 MG tablet  Every 4 hours PRN        08/25/21 0746              Melene Plan, DO 08/25/21 [id number]

## 2021-08-31 ENCOUNTER — Ambulatory Visit (INDEPENDENT_AMBULATORY_CARE_PROVIDER_SITE_OTHER): Payer: Medicare Other | Admitting: Surgery

## 2021-08-31 ENCOUNTER — Ambulatory Visit: Payer: Self-pay

## 2021-08-31 ENCOUNTER — Encounter: Payer: Self-pay | Admitting: Surgery

## 2021-08-31 DIAGNOSIS — M19071 Primary osteoarthritis, right ankle and foot: Secondary | ICD-10-CM | POA: Diagnosis not present

## 2021-08-31 DIAGNOSIS — M79671 Pain in right foot: Secondary | ICD-10-CM

## 2021-08-31 NOTE — Progress Notes (Signed)
Office Visit Note   Patient: Tanya Harris           Date of Birth: 1932/12/03           MRN: [id number] Visit Date: 08/31/2021              Requested by: Darreld Mclean, MD [address] [place],  [place] [id number] PCP: Darreld Mclean, MD   Assessment & Plan: Visit Diagnoses:  1. Pain in right foot   2. Osteoarthritis of right ankle and foot     Plan: Advised patient that I think the intermittent discomfort that she is feeling is from midfoot DJD.  She can use Voltaren gel twice daily as needed.  I did give her a prescription to get custom orthotics at [facility].  Follow-up as needed.  If she does not get improvement of her foot pain she will contact me and let me know and I will schedule her an appointment to see Dr. Meridee Score.  Follow-Up Instructions: Return if symptoms worsen or fail to improve.   Orders:  Orders Placed This Encounter  Procedures   XR Foot Complete Right   No orders of the defined types were placed in this encounter.     Procedures: No procedures performed   Clinical Data: No additional findings.   Subjective: Chief Complaint  Patient presents with   Right Knee - Pain   Right Foot - Pain    HPI 88 year old white female is new patient to clinic comes in today with complaints of right foot pain.  Patient was recently seen at [facility] ED for acute right knee pain August 25, 2021.  She states that currently her knee pain has resolved but she has been having some discomfort around the right midfoot over the last couple of days.  No injury.  Pain is not limiting her activity. Review of Systems No current cardiopulmonary GI/GU issue  Objective: Vital Signs: There were no vitals taken for this visit.  Physical Exam HENT:     Head: Normocephalic.     Nose: Nose normal.  Eyes:     Extraocular Movements: Extraocular movements intact.  Pulmonary:     Effort: No respiratory distress.  Musculoskeletal:      Comments: Patient ambulates well with a walker.  Exam right foot there is no swelling or bruising.  She has mild tenderness across the dorsal midfoot.  Neurological:     Mental Status: She is alert and oriented to person, place, and time.  Psychiatric:        Mood and Affect: Mood normal.     Ortho Exam  Specialty Comments:  No specialty comments available.  Imaging: No results found.   PMFS History: Patient Active Problem List   Diagnosis Date Noted   Scoliosis, levoconvex 05/04/2018   Strangulated left femoral hernia s/p lap SB resction & hernia repair 05/04/2018 05/04/2018   SBO (small bowel obstruction) ([place]) 05/04/2018   Fibroid uterus 05/04/2018   Abdominal wall asymmetry 05/04/2018   Diverticulosis of left colon 05/04/2018   Hyperglycemia 05/04/2018   Spinal stenosis    Rotator cuff tendinitis, left 01/03/2018   History of excessive cerumen 12/04/2017   Chronic pain of left knee 10/01/2017   Fall at home 05/03/2017   Lesion of skin of breast 12/12/2015   HOH (hard of hearing) 12/08/2014   Hyperlipidemia 06/04/2014   Buckle type impacted fracture of the distal left radial metaphysis 05/04/2014   Closed  nondisplaced fracture of styloid process of left ulna 05/04/2014   Chronic allergic rhinitis 04/15/2014   COPD with chronic bronchitis ([place]) 11/02/2013   Osteopenia 11/02/2013   Gait instability 11/02/2013   Past Medical History:  Diagnosis Date   Buckle type impacted fracture of the distal left radial metaphysis 05/04/2014   Closed nondisplaced fracture of styloid process of left ulna 05/04/2014   Coccygeal contusion 05/02/2015   COPD (chronic obstructive pulmonary disease) (HCC)    Mild   DJD (degenerative joint disease) of knee    Fall at home 05/04/2018   Rotator cuff tendinitis, left 01/03/2018   Scoliosis    Spinal stenosis     Family History  Problem Relation Age of Onset   Heart disease Mother 20       Deceased   Diabetes Mother    Heart disease Father 31        Deceased   Asthma Father    Heart disease Maternal Uncle    Colon cancer Brother    Diabetes Brother        #2   Heart disease Brother        x2   Healthy Sister    Healthy Brother        x1   Hyperlipidemia Son        x2    Past Surgical History:  Procedure Laterality Date   APPENDECTOMY  2005   CATARACT EXTRACTION     Bilateral   LAPAROSCOPY N/A 05/04/2018   Procedure: LAPAROSCOPY DIAGNOSTIC , FEMORAL  HERNIA REPAIR WITH SMALL BOWEL RESECTION WITH TAP BLOCK;  Surgeon: Michael Boston, MD;  Location: [facility];  Service: General;  Laterality: N/A;   NASAL SINUS SURGERY     TONSILLECTOMY     TOTAL KNEE ARTHROPLASTY  2001   Left   Social History   Occupational History   Not on file  Tobacco Use   Smoking status: Never   Smokeless tobacco: Never  Vaping Use   Vaping Use: Never used  Substance and Sexual Activity   Alcohol use: No   Drug use: Never   Sexual activity: Not on file

## 2021-09-04 DIAGNOSIS — J301 Allergic rhinitis due to pollen: Secondary | ICD-10-CM | POA: Diagnosis not present

## 2021-09-11 DIAGNOSIS — J301 Allergic rhinitis due to pollen: Secondary | ICD-10-CM | POA: Diagnosis not present

## 2021-09-18 DIAGNOSIS — J301 Allergic rhinitis due to pollen: Secondary | ICD-10-CM | POA: Diagnosis not present

## 2021-09-27 DIAGNOSIS — J301 Allergic rhinitis due to pollen: Secondary | ICD-10-CM | POA: Diagnosis not present

## 2021-10-04 DIAGNOSIS — J301 Allergic rhinitis due to pollen: Secondary | ICD-10-CM | POA: Diagnosis not present

## 2021-10-11 DIAGNOSIS — J301 Allergic rhinitis due to pollen: Secondary | ICD-10-CM | POA: Diagnosis not present

## 2021-10-18 DIAGNOSIS — J301 Allergic rhinitis due to pollen: Secondary | ICD-10-CM | POA: Diagnosis not present

## 2021-10-25 DIAGNOSIS — J301 Allergic rhinitis due to pollen: Secondary | ICD-10-CM | POA: Diagnosis not present

## 2021-10-26 DIAGNOSIS — J301 Allergic rhinitis due to pollen: Secondary | ICD-10-CM | POA: Diagnosis not present

## 2021-11-01 DIAGNOSIS — J301 Allergic rhinitis due to pollen: Secondary | ICD-10-CM | POA: Diagnosis not present

## 2021-11-08 DIAGNOSIS — J301 Allergic rhinitis due to pollen: Secondary | ICD-10-CM | POA: Diagnosis not present

## 2021-11-13 DIAGNOSIS — J301 Allergic rhinitis due to pollen: Secondary | ICD-10-CM | POA: Diagnosis not present

## 2021-11-20 DIAGNOSIS — J301 Allergic rhinitis due to pollen: Secondary | ICD-10-CM | POA: Diagnosis not present

## 2021-11-27 DIAGNOSIS — J301 Allergic rhinitis due to pollen: Secondary | ICD-10-CM | POA: Diagnosis not present

## 2021-11-30 DIAGNOSIS — J301 Allergic rhinitis due to pollen: Secondary | ICD-10-CM | POA: Diagnosis not present

## 2021-11-30 NOTE — Progress Notes (Addendum)
[facility] [place], [place], [place] [address] [phone]  Date:  12/04/2021   Name:  Tanya Harris   DOB:  December 09, 1932   MRN:  [id number]  PCP:  Darreld Mclean, MD    Chief Complaint: 6 month follow up (Concerns/ questions: yes/Flu shot today: yes)   History of Present Illness:  Tanya Harris is a 88 y.o. very pleasant female patient who presents with the following:  Patient seen today for periodic follow-up Older lady with history of COPD, hearing loss, osteopenia, hyperlipidemia, spinal stenosis, peripheral neuropathy   She is no longer seeing Dr. Nelva Bush as her spine symptoms are stable. Most recent visit with myself was in March She is currently receiving immunotherapy through [facility] Since our last visit she was in the ER in June with knee pain, she has followed up with orthopedics  Patient lives independently with her husband.  They have 2 sons, 1 in [place] and 1 local.  2 granddaughters; one is a Tree surgeon and the other is turning 31 soon She uses a rolling walker to ambulate- doing pretty well with this but asks about possibly getting a scooter in the future. She will let me know if/ when she needs this   Tylenol No. 3 as needed for knee pain- does not need this today  Fosamax Gabapentin 300 at bedtime Meloxicam as needed- she takes twice a week Trazodone at bedtime Wixela inhaler  Flu shot- give today  Recommend COVID booster, RSV this fall  ?  Mammogram-last done 2018, patient has likely decided to stop- she does not wish to do right now  Update lab work today Dexa- she does not wish to do today   Pt notes alternating constipation and diarrhea She does use miralax and this will help but it can cause some diarrhea She has noted this for several years -perhaps 3 years She does sometimes have some stomach discomfort when she is more constipated.  Her last  colonoscopy was about a decade ago  Due to patient's mobility limitations, prepping for colonoscopy will be very difficult for her She has lost a few pounds over the last 6 months  Wt Readings from Last 3 Encounters:  12/04/21 129 lb 12.8 oz (58.9 kg)  08/11/21 130 lb 12.8 oz (59.3 kg)  05/31/21 134 lb (60.8 kg)    Patient Active Problem List   Diagnosis Date Noted   Scoliosis, levoconvex 05/04/2018   Strangulated left femoral hernia s/p lap SB resction & hernia repair 05/04/2018 05/04/2018   SBO (small bowel obstruction) ([place]) 05/04/2018   Fibroid uterus 05/04/2018   Abdominal wall asymmetry 05/04/2018   Diverticulosis of left colon 05/04/2018   Hyperglycemia 05/04/2018   Spinal stenosis    Rotator cuff tendinitis, left 01/03/2018   History of excessive cerumen 12/04/2017   Chronic pain of left knee 10/01/2017   Fall at home 05/03/2017   Lesion of skin of breast 12/12/2015   HOH (hard of hearing) 12/08/2014   Hyperlipidemia 06/04/2014   Buckle type impacted fracture of the distal left radial metaphysis 05/04/2014   Closed nondisplaced fracture of styloid process of left ulna 05/04/2014   Chronic allergic rhinitis 04/15/2014   COPD with chronic bronchitis ([place]) 11/02/2013   Osteopenia 11/02/2013   Gait instability 11/02/2013    Past Medical History:  Diagnosis Date   Buckle type impacted fracture of the distal left radial metaphysis 05/04/2014   Closed  nondisplaced fracture of styloid process of left ulna 05/04/2014   Coccygeal contusion 05/02/2015   COPD (chronic obstructive pulmonary disease) (HCC)    Mild   DJD (degenerative joint disease) of knee    Fall at home 05/04/2018   Rotator cuff tendinitis, left 01/03/2018   Scoliosis    Spinal stenosis     Past Surgical History:  Procedure Laterality Date   APPENDECTOMY  2005   CATARACT EXTRACTION     Bilateral   LAPAROSCOPY N/A 05/04/2018   Procedure: LAPAROSCOPY DIAGNOSTIC , FEMORAL  HERNIA REPAIR WITH SMALL BOWEL RESECTION  WITH TAP BLOCK;  Surgeon: Michael Boston, MD;  Location: [facility];  Service: General;  Laterality: N/A;   NASAL SINUS SURGERY     TONSILLECTOMY     TOTAL KNEE ARTHROPLASTY  2001   Left    Social History   Tobacco Use   Smoking status: Never   Smokeless tobacco: Never  Vaping Use   Vaping Use: Never used  Substance Use Topics   Alcohol use: No   Drug use: Never    Family History  Problem Relation Age of Onset   Heart disease Mother 74       Deceased   Diabetes Mother    Heart disease Father 76       Deceased   Asthma Father    Heart disease Maternal Uncle    Colon cancer Brother    Diabetes Brother        #2   Heart disease Brother        x2   Healthy Sister    Healthy Brother        x1   Hyperlipidemia Son        x2    Allergies  Allergen Reactions   Ibuprofen Swelling    Mouth Swelling- however pt confirms that she is able to take mobic with no problems 11/2016    Amoxicillin Nausea And Vomiting    Did it involve swelling of the face/tongue/throat, SOB, or low BP? no Did it involve sudden or severe rash/hives, skin peeling, or any reaction on the inside of your mouth or nose? no Did you need to seek medical attention at a hospital or doctor's office? no  When did it last happen?      2005 If all above answers are "NO", may proceed with cephalosporin use.     Medication list has been reviewed and updated.  Current Outpatient Medications on File Prior to Visit  Medication Sig Dispense Refill   acetaminophen-codeine (TYLENOL #3) 300-30 MG tablet Take 1 tablet by mouth every 8 (eight) hours as needed for moderate pain. For left knee pain 60 tablet 1   alendronate (FOSAMAX) 70 MG tablet TAKE 1 TABLET(70 MG) BY MOUTH EVERY 7 DAYS WITH A FULL GLASS OF WATER AND ON AN EMPTY STOMACH 12 tablet 3   Calcium Carbonate-Vitamin D 600-400 MG-UNIT tablet Take 1 tablet by mouth daily. 800 units vitamin D     fluticasone (FLONASE) 50 MCG/ACT nasal spray Place 2 sprays into both  nostrils daily. 9.9 g 11   gabapentin (NEURONTIN) 300 MG capsule Take 1 capsule (300 mg total) by mouth at bedtime. 90 capsule 3   meloxicam (MOBIC) 15 MG tablet TAKE 1 TABLET(15 MG) BY MOUTH DAILY AS NEEDED FOR PAIN 90 tablet 1   Multiple Vitamin (MULTIVITAMIN) tablet Take 1 tablet by mouth daily.     omega-3 fish oil (MAXEPA) 1000 MG CAPS capsule Take 1 capsule by mouth  daily.      OVER THE COUNTER MEDICATION Apply 1 application topically as needed (neuropathy/feet). OTC Pain relieving foot cream: Magnilife DB     polyethylene glycol (MIRALAX / GLYCOLAX) packet Take 17 g by mouth daily. (Patient taking differently: Take 17 g by mouth daily as needed.) 14 each 0   polyvinyl alcohol (LIQUIFILM TEARS) 1.4 % ophthalmic solution Place 1 drop into both eyes as needed for dry eyes.     traZODone (DESYREL) 100 MG tablet Take 1 tablet (100 mg total) by mouth daily as needed. Use for insomnia 90 tablet 3   Trolamine Salicylate (ASPERCREME EX) Apply topically daily as needed.     UNABLE TO FIND Allergy shots weekly, Dr. Laurance Flatten.     WIXELA INHUB 250-50 MCG/ACT AEPB INHALE 1 PUFF INTO THE LUNGS TWICE DAILY 60 each 5   No current facility-administered medications on file prior to visit.    Review of Systems:  As per HPI- otherwise negative.   Physical Examination: Vitals:   12/04/21 0811  BP: 120/80  Pulse: 62  Resp: 18  Temp: 97.8 F (36.6 C)  SpO2: 91%   Vitals:   12/04/21 0811  Weight: 129 lb 12.8 oz (58.9 kg)  Height: '5\' 1"'$  (1.549 m)   Body mass index is 24.53 kg/m. Ideal Body Weight: Weight in (lb) to have BMI = 25: 132  GEN: no acute distress.  Looks well  HEENT: Atraumatic, Normocephalic.  Ears and Nose: No external deformity. CV: RRR, No M/G/R. No JVD. No thrill. No extra heart sounds. PULM: CTA B, no wheezes, crackles, rhonchi. No retractions. No resp. distress. No accessory muscle use. ABD: S, NT, ND, +BS. No rebound. No HSM.  No definite masses are palpated, however  patient's abdomen does extend more over her left side due to her spine changes EXTR: No c/c/e PSYCH: Normally interactive. Conversant.  Severe scoliosis, kyphosis.  Uses a rolling walker Unable to use step to get onto raised exam table   Assessment and Plan: Chronic pain of left knee  Hyperlipidemia, unspecified hyperlipidemia type - Plan: Lipid panel  Osteopenia, unspecified location  Elevated glucose - Plan: Comprehensive metabolic panel, Hemoglobin A1c  Fatigue, unspecified type - Plan: TSH  Screening for deficiency anemia - Plan: CBC  Osteopenia of forearm, unspecified laterality - Plan: alendronate (FOSAMAX) 70 MG tablet  Medication monitoring encounter - Plan: CBC  Intestinal obstruction, unspecified cause, unspecified whether partial or complete ([place]) - Plan: CT Abdomen Pelvis W Contrast  Bowel habit changes - Plan: CT Abdomen Pelvis W Contrast  Need for influenza vaccination - Plan: Flu Vaccine QUAD High Dose(Fluad)  Following up today  Routine labs pending as above Gave flu shot, recommended COVID and RSV vaccinations She has noted alternating constipation and diarrhea CT scan in 2020 showed possible small bowel obstruction.  It has been 10 years since her last colonoscopy, but doing a colonoscopy prep is very problematic for this patient with limited mobility.  For the time being we will pursue a CT abdomen pelvis in hopes of ruling out any abdominal mass or other major issue  Signed Lamar Blinks, MD  Received labs as below, message to patient Results for orders placed or performed in visit on 12/04/21  CBC  Result Value Ref Range   WBC 5.5 4.0 - 10.5 K/uL   RBC 4.45 3.87 - 5.11 Mil/uL   Platelets 226.0 150.0 - 400.0 K/uL   Hemoglobin 13.7 12.0 - 15.0 g/dL   HCT 40.0 36.0 - 46.0 %  MCV 90.0 78.0 - 100.0 fl   MCHC 34.4 30.0 - 36.0 g/dL   RDW 14.0 11.5 - 15.5 %  Comprehensive metabolic panel  Result Value Ref Range   Sodium 132 (L) 135 - 145 mEq/L    Potassium 4.2 3.5 - 5.1 mEq/L   Chloride 96 96 - 112 mEq/L   CO2 29 19 - 32 mEq/L   Glucose, Bld 88 70 - 99 mg/dL   BUN 17 6 - 23 mg/dL   Creatinine, Ser 0.52 0.40 - 1.20 mg/dL   Total Bilirubin 0.5 0.2 - 1.2 mg/dL   Alkaline Phosphatase 85 39 - 117 U/L   AST 29 0 - 37 U/L   ALT 18 0 - 35 U/L   Total Protein 7.2 6.0 - 8.3 g/dL   Albumin 3.9 3.5 - 5.2 g/dL   GFR 83.68 >60.00 mL/min   Calcium 10.0 8.4 - 10.5 mg/dL  Hemoglobin A1c  Result Value Ref Range   Hgb A1c MFr Bld 5.6 4.6 - 6.5 %  Lipid panel  Result Value Ref Range   Cholesterol 195 0 - 200 mg/dL   Triglycerides 92.0 0.0 - 149.0 mg/dL   HDL 64.80 >39.00 mg/dL   VLDL 18.4 0.0 - 40.0 mg/dL   LDL Cholesterol 112 (H) 0 - 99 mg/dL   Total CHOL/HDL Ratio 3    NonHDL 130.56   TSH  Result Value Ref Range   TSH 1.19 0.35 - 5.50 uIU/mL

## 2021-11-30 NOTE — Patient Instructions (Addendum)
It was great to see you again today, I will be in touch with your labs as soon as possible.  Assuming all is well please see me in about 6 months  Recommend COVID shot, dose of RSV this fall-both available at your pharmacy I ordered a CT of your abdomen and pelvis to look at your bowel habit changes.  Please stop by imaging on the ground floor today and you can schedule for later this week at a convenient time

## 2021-12-04 ENCOUNTER — Encounter: Payer: Self-pay | Admitting: Family Medicine

## 2021-12-04 ENCOUNTER — Ambulatory Visit (INDEPENDENT_AMBULATORY_CARE_PROVIDER_SITE_OTHER): Payer: Medicare Other | Admitting: Family Medicine

## 2021-12-04 VITALS — BP 120/80 | HR 62 | Temp 97.8°F | Resp 18 | Ht 61.0 in | Wt 129.8 lb

## 2021-12-04 DIAGNOSIS — M25562 Pain in left knee: Secondary | ICD-10-CM | POA: Diagnosis not present

## 2021-12-04 DIAGNOSIS — R5383 Other fatigue: Secondary | ICD-10-CM

## 2021-12-04 DIAGNOSIS — E785 Hyperlipidemia, unspecified: Secondary | ICD-10-CM

## 2021-12-04 DIAGNOSIS — E871 Hypo-osmolality and hyponatremia: Secondary | ICD-10-CM

## 2021-12-04 DIAGNOSIS — M8580 Other specified disorders of bone density and structure, unspecified site: Secondary | ICD-10-CM

## 2021-12-04 DIAGNOSIS — Z23 Encounter for immunization: Secondary | ICD-10-CM

## 2021-12-04 DIAGNOSIS — K56609 Unspecified intestinal obstruction, unspecified as to partial versus complete obstruction: Secondary | ICD-10-CM

## 2021-12-04 DIAGNOSIS — R7309 Other abnormal glucose: Secondary | ICD-10-CM

## 2021-12-04 DIAGNOSIS — G8929 Other chronic pain: Secondary | ICD-10-CM | POA: Diagnosis not present

## 2021-12-04 DIAGNOSIS — Z130 Encounter for screening for diseases of the blood and blood-forming organs and certain disorders involving the immune mechanism: Secondary | ICD-10-CM | POA: Diagnosis not present

## 2021-12-04 DIAGNOSIS — Z5181 Encounter for therapeutic drug level monitoring: Secondary | ICD-10-CM | POA: Diagnosis not present

## 2021-12-04 DIAGNOSIS — R194 Change in bowel habit: Secondary | ICD-10-CM | POA: Diagnosis not present

## 2021-12-04 DIAGNOSIS — M85839 Other specified disorders of bone density and structure, unspecified forearm: Secondary | ICD-10-CM | POA: Diagnosis not present

## 2021-12-04 LAB — CBC
HCT: 40 % (ref 36.0–46.0)
Hemoglobin: 13.7 g/dL (ref 12.0–15.0)
MCHC: 34.4 g/dL (ref 30.0–36.0)
MCV: 90 fl (ref 78.0–100.0)
Platelets: 226 10*3/uL (ref 150.0–400.0)
RBC: 4.45 Mil/uL (ref 3.87–5.11)
RDW: 14 % (ref 11.5–15.5)
WBC: 5.5 10*3/uL (ref 4.0–10.5)

## 2021-12-04 LAB — COMPREHENSIVE METABOLIC PANEL
ALT: 18 U/L (ref 0–35)
AST: 29 U/L (ref 0–37)
Albumin: 3.9 g/dL (ref 3.5–5.2)
Alkaline Phosphatase: 85 U/L (ref 39–117)
BUN: 17 mg/dL (ref 6–23)
CO2: 29 mEq/L (ref 19–32)
Calcium: 10 mg/dL (ref 8.4–10.5)
Chloride: 96 mEq/L (ref 96–112)
Creatinine, Ser: 0.52 mg/dL (ref 0.40–1.20)
GFR: 83.68 mL/min (ref >60.00)
Glucose, Bld: 88 mg/dL (ref 70–99)
Potassium: 4.2 mEq/L (ref 3.5–5.1)
Sodium: 132 mEq/L — ABNORMAL LOW (ref 135–145)
Total Bilirubin: 0.5 mg/dL (ref 0.2–1.2)
Total Protein: 7.2 g/dL (ref 6.0–8.3)

## 2021-12-04 LAB — LIPID PANEL
Cholesterol: 195 mg/dL (ref 0–200)
HDL: 64.8 mg/dL (ref >39.00)
LDL Cholesterol: 112 mg/dL — ABNORMAL HIGH (ref 0–99)
NonHDL: 130.56
Total CHOL/HDL Ratio: 3
Triglycerides: 92 mg/dL (ref 0.0–149.0)
VLDL: 18.4 mg/dL (ref 0.0–40.0)

## 2021-12-04 LAB — TSH: TSH: 1.19 u[IU]/mL (ref 0.35–5.50)

## 2021-12-04 LAB — HEMOGLOBIN A1C: Hgb A1c MFr Bld: 5.6 % (ref 4.6–6.5)

## 2021-12-04 MED ORDER — ALENDRONATE SODIUM 70 MG PO TABS: ORAL_TABLET | ORAL | 3 refills | Status: DC

## 2021-12-04 NOTE — Addendum Note (Signed)
Addended by: Lamar Blinks C on: 12/04/2021 08:53 PM   Modules accepted: Orders

## 2021-12-06 DIAGNOSIS — J301 Allergic rhinitis due to pollen: Secondary | ICD-10-CM | POA: Diagnosis not present

## 2021-12-13 DIAGNOSIS — J301 Allergic rhinitis due to pollen: Secondary | ICD-10-CM | POA: Diagnosis not present

## 2021-12-18 DIAGNOSIS — J301 Allergic rhinitis due to pollen: Secondary | ICD-10-CM | POA: Diagnosis not present

## 2021-12-22 ENCOUNTER — Other Ambulatory Visit (HOSPITAL_BASED_OUTPATIENT_CLINIC_OR_DEPARTMENT_OTHER): Payer: Self-pay

## 2021-12-22 ENCOUNTER — Telehealth: Payer: Self-pay | Admitting: Family Medicine

## 2021-12-22 DIAGNOSIS — Z23 Encounter for immunization: Secondary | ICD-10-CM | POA: Diagnosis not present

## 2021-12-22 MED ORDER — COMIRNATY 30 MCG/0.3ML IM SUSY
PREFILLED_SYRINGE | INTRAMUSCULAR | 0 refills | Status: DC
  Filled 2021-12-22: qty 0.3, 1d supply, fill #0

## 2021-12-22 NOTE — Telephone Encounter (Signed)
In folder for review.

## 2021-12-22 NOTE — Telephone Encounter (Signed)
Pt dropped off copy of Living Will for provider to see and have on pt's chart. Document put at front office tray under providers name.

## 2021-12-25 ENCOUNTER — Other Ambulatory Visit (INDEPENDENT_AMBULATORY_CARE_PROVIDER_SITE_OTHER): Payer: Medicare Other

## 2021-12-25 ENCOUNTER — Encounter: Payer: Self-pay | Admitting: Family Medicine

## 2021-12-25 DIAGNOSIS — E871 Hypo-osmolality and hyponatremia: Secondary | ICD-10-CM

## 2021-12-25 DIAGNOSIS — J301 Allergic rhinitis due to pollen: Secondary | ICD-10-CM | POA: Diagnosis not present

## 2021-12-25 LAB — BASIC METABOLIC PANEL
BUN: 26 mg/dL — ABNORMAL HIGH (ref 6–23)
CO2: 30 mEq/L (ref 19–32)
Calcium: 9.9 mg/dL (ref 8.4–10.5)
Chloride: 99 mEq/L (ref 96–112)
Creatinine, Ser: 0.45 mg/dL (ref 0.40–1.20)
GFR: 86.61 mL/min (ref >60.00)
Glucose, Bld: 84 mg/dL (ref 70–99)
Potassium: 4.3 mEq/L (ref 3.5–5.1)
Sodium: 135 mEq/L (ref 135–145)

## 2021-12-26 NOTE — Progress Notes (Unsigned)
[facility] at [facility] [address], [place], [place] [address] [phone]  Date:  01/01/2022   Name:  Tanya Harris   DOB:  [birth year]   MRN:  [id number]  PCP:  Darreld Mclean, MD    Chief Complaint: No chief complaint on file.   History of Present Illness:  Tanya Harris is a 89 y.o. very pleasant female patient who presents with the following:  Patient seen today with a left breast concern Most recent visit with myself was earlier this month to follow-up on knee pain Older lady with history of COPD, hearing loss, osteopenia, hyperlipidemia, spinal stenosis, peripheral neuropathy  Recommend latest COVID-19 vaccine  Patient Active Problem List   Diagnosis Date Noted   Scoliosis, levoconvex 05/04/2018   Strangulated left femoral hernia s/p lap SB resction & hernia repair 05/04/2018 05/04/2018   SBO (small bowel obstruction) ([place]) 05/04/2018   Fibroid uterus 05/04/2018   Abdominal wall asymmetry 05/04/2018   Diverticulosis of left colon 05/04/2018   Hyperglycemia 05/04/2018   Spinal stenosis    Rotator cuff tendinitis, left 01/03/2018   History of excessive cerumen 12/04/2017   Chronic pain of left knee 10/01/2017   Fall at home 05/03/2017   Lesion of skin of breast 12/12/2015   HOH (hard of hearing) 12/08/2014   Hyperlipidemia 06/04/2014   Buckle type impacted fracture of the distal left radial metaphysis 05/04/2014   Closed nondisplaced fracture of styloid process of left ulna 05/04/2014   Chronic allergic rhinitis 04/15/2014   COPD with chronic bronchitis ([place]) 11/02/2013   Osteopenia 11/02/2013   Gait instability 11/02/2013    Past Medical History:  Diagnosis Date   Buckle type impacted fracture of the distal left radial metaphysis 05/04/2014   Closed nondisplaced fracture of styloid process of left ulna 05/04/2014   Coccygeal contusion 05/02/2015   COPD (chronic obstructive pulmonary disease) (HCC)    Mild    DJD (degenerative joint disease) of knee    Fall at home 05/04/2018   Rotator cuff tendinitis, left 01/03/2018   Scoliosis    Spinal stenosis     Past Surgical History:  Procedure Laterality Date   APPENDECTOMY  2005   CATARACT EXTRACTION     Bilateral   LAPAROSCOPY N/A 05/04/2018   Procedure: LAPAROSCOPY DIAGNOSTIC , FEMORAL  HERNIA REPAIR WITH SMALL BOWEL RESECTION WITH TAP BLOCK;  Surgeon: Michael Boston, MD;  Location: [facility];  Service: General;  Laterality: N/A;   NASAL SINUS SURGERY     TONSILLECTOMY     TOTAL KNEE ARTHROPLASTY  2001   Left    Social History   Tobacco Use   Smoking status: Never   Smokeless tobacco: Never  Vaping Use   Vaping Use: Never used  Substance Use Topics   Alcohol use: No   Drug use: Never    Family History  Problem Relation Age of Onset   Heart disease Mother 57       Deceased   Diabetes Mother    Heart disease Father 52       Deceased   Asthma Father    Heart disease Maternal Uncle    Colon cancer Brother    Diabetes Brother        #2   Heart disease Brother        x2   Healthy Sister    Healthy Brother        x1   Hyperlipidemia Son  x2    Allergies  Allergen Reactions   Ibuprofen Swelling    Mouth Swelling- however pt confirms that she is able to take mobic with no problems 11/2016    Amoxicillin Nausea And Vomiting    Did it involve swelling of the face/tongue/throat, SOB, or low BP? no Did it involve sudden or severe rash/hives, skin peeling, or any reaction on the inside of your mouth or nose? no Did you need to seek medical attention at a hospital or doctor's office? no  When did it last happen?      2005 If all above answers are "NO", may proceed with cephalosporin use.     Medication list has been reviewed and updated.  Current Outpatient Medications on File Prior to Visit  Medication Sig Dispense Refill   acetaminophen-codeine (TYLENOL #3) 300-30 MG tablet Take 1 tablet by mouth every 8 (eight) hours  as needed for moderate pain. For left knee pain 60 tablet 1   alendronate (FOSAMAX) 70 MG tablet TAKE 1 TABLET(70 MG) BY MOUTH EVERY 7 DAYS WITH A FULL GLASS OF WATER AND ON AN EMPTY STOMACH 12 tablet 3   Calcium Carbonate-Vitamin D 600-400 MG-UNIT tablet Take 1 tablet by mouth daily. 800 units vitamin D     COVID-19 mRNA vaccine 2023-2024 ([place]) syringe Inject into the muscle. 0.3 mL 0   fluticasone (FLONASE) 50 MCG/ACT nasal spray Place 2 sprays into both nostrils daily. 9.9 g 11   gabapentin (NEURONTIN) 300 MG capsule Take 1 capsule (300 mg total) by mouth at bedtime. 90 capsule 3   meloxicam (MOBIC) 15 MG tablet TAKE 1 TABLET(15 MG) BY MOUTH DAILY AS NEEDED FOR PAIN 90 tablet 1   Multiple Vitamin (MULTIVITAMIN) tablet Take 1 tablet by mouth daily.     omega-3 fish oil (MAXEPA) 1000 MG CAPS capsule Take 1 capsule by mouth daily.      OVER THE COUNTER MEDICATION Apply 1 application topically as needed (neuropathy/feet). OTC Pain relieving foot cream: Magnilife DB     polyethylene glycol (MIRALAX / GLYCOLAX) packet Take 17 g by mouth daily. (Patient taking differently: Take 17 g by mouth daily as needed.) 14 each 0   polyvinyl alcohol (LIQUIFILM TEARS) 1.4 % ophthalmic solution Place 1 drop into both eyes as needed for dry eyes.     traZODone (DESYREL) 100 MG tablet Take 1 tablet (100 mg total) by mouth daily as needed. Use for insomnia 90 tablet 3   Trolamine Salicylate (ASPERCREME EX) Apply topically daily as needed.     UNABLE TO FIND Allergy shots weekly, Dr. Laurance Flatten.     WIXELA INHUB 250-50 MCG/ACT AEPB INHALE 1 PUFF INTO THE LUNGS TWICE DAILY 60 each 5   No current facility-administered medications on file prior to visit.    Review of Systems:  As per HPI- otherwise negative.   Physical Examination: There were no vitals filed for this visit. There were no vitals filed for this visit. There is no height or weight on file to calculate BMI. Ideal Body Weight:    GEN: no acute  distress. HEENT: Atraumatic, Normocephalic.  Ears and Nose: No external deformity. CV: RRR, No M/G/R. No JVD. No thrill. No extra heart sounds. PULM: CTA B, no wheezes, crackles, rhonchi. No retractions. No resp. distress. No accessory muscle use. ABD: S, NT, ND, +BS. No rebound. No HSM. EXTR: No c/c/e PSYCH: Normally interactive. Conversant.    Assessment and Plan: ***  Signed Lamar Blinks, MD

## 2021-12-26 NOTE — Patient Instructions (Addendum)
It was good to see you again today Recommend covid booster and RSV this fall if not done already  Let me know if the pain in your breast comes back

## 2022-01-01 ENCOUNTER — Ambulatory Visit (INDEPENDENT_AMBULATORY_CARE_PROVIDER_SITE_OTHER): Payer: Medicare Other | Admitting: Family Medicine

## 2022-01-01 VITALS — BP 132/78 | HR 74 | Resp 18 | Ht 61.0 in | Wt 125.0 lb

## 2022-01-01 DIAGNOSIS — G8929 Other chronic pain: Secondary | ICD-10-CM

## 2022-01-01 DIAGNOSIS — N644 Mastodynia: Secondary | ICD-10-CM | POA: Diagnosis not present

## 2022-01-01 DIAGNOSIS — M25562 Pain in left knee: Secondary | ICD-10-CM | POA: Diagnosis not present

## 2022-01-01 DIAGNOSIS — J301 Allergic rhinitis due to pollen: Secondary | ICD-10-CM | POA: Diagnosis not present

## 2022-01-01 MED ORDER — ACETAMINOPHEN-CODEINE 300-30 MG PO TABS: 1.0000 | ORAL_TABLET | Freq: Four times a day (QID) | ORAL | 0 refills | Status: DC | PRN

## 2022-01-08 DIAGNOSIS — J301 Allergic rhinitis due to pollen: Secondary | ICD-10-CM | POA: Diagnosis not present

## 2022-01-15 DIAGNOSIS — J301 Allergic rhinitis due to pollen: Secondary | ICD-10-CM | POA: Diagnosis not present

## 2022-01-22 DIAGNOSIS — J301 Allergic rhinitis due to pollen: Secondary | ICD-10-CM | POA: Diagnosis not present

## 2022-01-29 DIAGNOSIS — J301 Allergic rhinitis due to pollen: Secondary | ICD-10-CM | POA: Diagnosis not present

## 2022-02-05 DIAGNOSIS — J301 Allergic rhinitis due to pollen: Secondary | ICD-10-CM | POA: Diagnosis not present

## 2022-02-14 DIAGNOSIS — J301 Allergic rhinitis due to pollen: Secondary | ICD-10-CM | POA: Diagnosis not present

## 2022-02-19 DIAGNOSIS — J301 Allergic rhinitis due to pollen: Secondary | ICD-10-CM | POA: Diagnosis not present

## 2022-02-22 DIAGNOSIS — J301 Allergic rhinitis due to pollen: Secondary | ICD-10-CM | POA: Diagnosis not present

## 2022-02-28 DIAGNOSIS — J301 Allergic rhinitis due to pollen: Secondary | ICD-10-CM | POA: Diagnosis not present

## 2022-03-07 DIAGNOSIS — J301 Allergic rhinitis due to pollen: Secondary | ICD-10-CM | POA: Diagnosis not present

## 2022-03-12 DIAGNOSIS — J301 Allergic rhinitis due to pollen: Secondary | ICD-10-CM | POA: Diagnosis not present

## 2022-03-22 DIAGNOSIS — J301 Allergic rhinitis due to pollen: Secondary | ICD-10-CM | POA: Diagnosis not present

## 2022-03-28 ENCOUNTER — Telehealth: Payer: Self-pay | Admitting: Family Medicine

## 2022-03-28 DIAGNOSIS — J301 Allergic rhinitis due to pollen: Secondary | ICD-10-CM | POA: Diagnosis not present

## 2022-03-28 NOTE — Telephone Encounter (Signed)
Spoke with patient she declined AWV

## 2022-03-29 DIAGNOSIS — J301 Allergic rhinitis due to pollen: Secondary | ICD-10-CM | POA: Diagnosis not present

## 2022-04-02 DIAGNOSIS — J301 Allergic rhinitis due to pollen: Secondary | ICD-10-CM | POA: Diagnosis not present

## 2022-04-11 DIAGNOSIS — J301 Allergic rhinitis due to pollen: Secondary | ICD-10-CM | POA: Diagnosis not present

## 2022-04-16 DIAGNOSIS — J301 Allergic rhinitis due to pollen: Secondary | ICD-10-CM | POA: Diagnosis not present

## 2022-04-25 DIAGNOSIS — J301 Allergic rhinitis due to pollen: Secondary | ICD-10-CM | POA: Diagnosis not present

## 2022-05-02 DIAGNOSIS — J301 Allergic rhinitis due to pollen: Secondary | ICD-10-CM | POA: Diagnosis not present

## 2022-05-07 ENCOUNTER — Telehealth: Payer: Self-pay | Admitting: Adult Health

## 2022-05-07 NOTE — Telephone Encounter (Signed)
Pt should have enough medication to last til wed appt

## 2022-05-07 NOTE — Telephone Encounter (Signed)
Pt is calling requesting a refill on abapentin (NEURONTIN) 300 MG capsule. Should be sent to [facility] [phone]

## 2022-05-07 NOTE — Telephone Encounter (Signed)
Called pt and scheduled a mychart appointment with Janett Billow on 3/6 @ 3:15 pm.

## 2022-05-08 NOTE — Patient Instructions (Addendum)
It was good to see you again today, take care Please stop by the ground floor and have an x-ray of your right hand Let me know if your sinusitis symptoms are not improving in the next few days

## 2022-05-08 NOTE — Progress Notes (Signed)
[facility] [address], [place], [place] [address] [phone] [id number] [phone]  Date:  05/16/2022   Name:  Tanya Harris   DOB:  06-24-1932   MRN:  [id number]  PCP:  Darreld Mclean, MD    Chief Complaint: 6 month follow up (Concerns/ questions: 1. Possible sinus infection. 2. R hand/ finger injury. 3. Hammertoe/AWV due)   History of Present Illness:  Tanya Harris is a 89 y.o. very pleasant female patient who presents with the following:  Patient seen today for periodic follow-up and to discuss a possible sinus infection Most recent visit with myself was in October Older lady with history of COPD, hearing loss, osteopenia, hyperlipidemia, spinal stenosis, peripheral neuropathy  She uses Tylenol No. 3 on occasion for her back and knee pain  Tylenol 3- I gave her a prescription for 30 in October but she never ended up filling it. Fosamax Gabapentin 300 at bedtime Meloxicam as needed Trazodone at bedtime Wixela inhaler  Lab work done in October, CMP, lipid, CBC, A1c, thyroid Her sodium was slightly low, but normal on recheck She prefers not to have labs today if possilbe  Pt notes her allergies have been bothering her for a few weeks since the weather has changed As of last week she feels like this has developed into a sinus infection- sinus pain and pressure, chest congestion, thick yellow mucus from her chest and nose She is taking her allergy shots - she has been on shots for 3 years now, weekly She keeps up with her shots faithfully No fever that she is aware of   She also jammed her pinky finger- this occurred about 3 weeks ago, she did not get it looked at as of yet.  She noted pain and swelling in the fifth metacarpal at the time of her injury, this is better but still bothersome The hand is still tender  Also her left 2nd toe seems like it is looking unusual to her although it is not painful  Patient Active Problem  List   Diagnosis Date Noted   Scoliosis, levoconvex 05/04/2018   Strangulated left femoral hernia s/p lap SB resction & hernia repair 05/04/2018 05/04/2018   SBO (small bowel obstruction) ([place]) 05/04/2018   Fibroid uterus 05/04/2018   Abdominal wall asymmetry 05/04/2018   Diverticulosis of left colon 05/04/2018   Hyperglycemia 05/04/2018   Spinal stenosis    Rotator cuff tendinitis, left 01/03/2018   History of excessive cerumen 12/04/2017   Chronic pain of left knee 10/01/2017   Fall at home 05/03/2017   Lesion of skin of breast 12/12/2015   HOH (hard of hearing) 12/08/2014   Hyperlipidemia 06/04/2014   Buckle type impacted fracture of the distal left radial metaphysis 05/04/2014   Closed nondisplaced fracture of styloid process of left ulna 05/04/2014   Chronic allergic rhinitis 04/15/2014   COPD with chronic bronchitis ([place]) 11/02/2013   Osteopenia 11/02/2013   Gait instability 11/02/2013    Past Medical History:  Diagnosis Date   Buckle type impacted fracture of the distal left radial metaphysis 05/04/2014   Closed nondisplaced fracture of styloid process of left ulna 05/04/2014   Coccygeal contusion 05/02/2015   COPD (chronic obstructive pulmonary disease) (HCC)    Mild   DJD (degenerative joint disease) of knee    Fall at home 05/04/2018   Rotator cuff tendinitis, left 01/03/2018   Scoliosis    Spinal stenosis     Past Surgical  History:  Procedure Laterality Date   APPENDECTOMY  2005   CATARACT EXTRACTION     Bilateral   LAPAROSCOPY N/A 05/04/2018   Procedure: LAPAROSCOPY DIAGNOSTIC , FEMORAL  HERNIA REPAIR WITH SMALL BOWEL RESECTION WITH TAP BLOCK;  Surgeon: Michael Boston, MD;  Location: [facility];  Service: General;  Laterality: N/A;   NASAL SINUS SURGERY     TONSILLECTOMY     TOTAL KNEE ARTHROPLASTY  2001   Left    Social History   Tobacco Use   Smoking status: Never   Smokeless tobacco: Never  Vaping Use   Vaping Use: Never used  Substance Use Topics   Alcohol  use: No   Drug use: Never    Family History  Problem Relation Age of Onset   Heart disease Mother 49       Deceased   Diabetes Mother    Heart disease Father 51       Deceased   Asthma Father    Heart disease Maternal Uncle    Colon cancer Brother    Diabetes Brother        #2   Heart disease Brother        x2   Healthy Sister    Healthy Brother        x1   Hyperlipidemia Son        x2    Allergies  Allergen Reactions   Ibuprofen Swelling    Mouth Swelling- however pt confirms that she is able to take mobic with no problems 11/2016    Amoxicillin Nausea And Vomiting    Did it involve swelling of the face/tongue/throat, SOB, or low BP? no Did it involve sudden or severe rash/hives, skin peeling, or any reaction on the inside of your mouth or nose? no Did you need to seek medical attention at a hospital or doctor's office? no  When did it last happen?      2005 If all above answers are "NO", may proceed with cephalosporin use.     Medication list has been reviewed and updated.  Current Outpatient Medications on File Prior to Visit  Medication Sig Dispense Refill   alendronate (FOSAMAX) 70 MG tablet TAKE 1 TABLET(70 MG) BY MOUTH EVERY 7 DAYS WITH A FULL GLASS OF WATER AND ON AN EMPTY STOMACH 12 tablet 3   Calcium Carbonate-Vitamin D 600-400 MG-UNIT tablet Take 1 tablet by mouth daily. 800 units vitamin D     gabapentin (NEURONTIN) 300 MG capsule Take 1 capsule (300 mg total) by mouth at bedtime. 90 capsule 3   Multiple Vitamin (MULTIVITAMIN) tablet Take 1 tablet by mouth daily.     omega-3 fish oil (MAXEPA) 1000 MG CAPS capsule Take 1 capsule by mouth daily.      polyethylene glycol (MIRALAX / GLYCOLAX) packet Take 17 g by mouth daily. (Patient taking differently: Take 17 g by mouth daily as needed.) 14 each 0   polyvinyl alcohol (LIQUIFILM TEARS) 1.4 % ophthalmic solution Place 1 drop into both eyes as needed for dry eyes.     UNABLE TO FIND Allergy shots weekly, Dr.  Laurance Flatten.     No current facility-administered medications on file prior to visit.    Review of Systems:  As per HPI- otherwise negative.   Physical Examination: Vitals:   05/16/22 0843  BP: (!) 140/80  Pulse: 79  Resp: 18  Temp: 97.6 F (36.4 C)  SpO2: 97%   Vitals:   05/16/22 0843  Weight: 127 lb 12.8  oz (58 kg)  Height: '5\' 1"'$  (1.549 m)   Body mass index is 24.15 kg/m. Ideal Body Weight: Weight in (lb) to have BMI = 25: 132  GEN: no acute distress.  Well-appearing elderly lady with kyphosis, uses a rolling walker HEENT: Atraumatic, Normocephalic.  Bilateral TM wnl, oropharynx normal.  PEERL,EOMI. nasal cavity displays thick purulent discharge Ears and Nose: No external deformity. CV: RRR, No M/G/R. No JVD. No thrill. No extra heart sounds. PULM: CTA B, no wheezes, crackles, rhonchi. No retractions. No resp. distress. No accessory muscle use. ABD: S, NT, ND, +BS. No rebound. No HSM. EXTR: No c/c/e PSYCH: Normally interactive. Conversant.  Hammertoe on left second toe with some associated bruising, likely due to shoe trauma.  The toe is nontender and there is no skin breakdown Strong dorsal pulse in foot The right pinky finger is normal.  However, of the right fifth metatarsal is tender and still shows some bruising.  Will obtain an x-ray    Assessment and Plan: Chronic pain of left knee - Plan: acetaminophen-codeine (TYLENOL #3) 300-30 MG tablet  Hyperlipidemia, unspecified hyperlipidemia type  COPD with chronic bronchitis ([facility]) - Plan: fluticasone-salmeterol (WIXELA INHUB) 250-50 MCG/ACT AEPB  Hearing loss, unspecified hearing loss type, unspecified laterality  Pain in finger of right hand - Plan: DG Hand Complete Right, CANCELED: DG Hand Complete Right  Seasonal allergies - Plan: fluticasone (FLONASE) 50 MCG/ACT nasal spray  Primary osteoarthritis, unspecified site - Plan: meloxicam (MOBIC) 15 MG tablet  Primary insomnia - Plan: traZODone (DESYREL) 100 MG  tablet  Acute non-recurrent frontal sinusitis - Plan: doxycycline (VIBRA-TABS) 100 MG tablet  Closed fracture of right hand, initial encounter - Plan: Ambulatory referral to Orthopedic Surgery  Patient seen today with a couple of concerns.  Refilled medications.  Sinus infection, treat with doxycycline.  She will let me know if not doing better.  Reassured regarding hammertoe, nonpainful I am concerned about a boxer's fracture, obtain stat x-ray today   Signed Lamar Blinks, MD  Received her hand x-ray as below Called pt, we got her an ortho appt at 2pm today  DG Hand Complete Right  Result Date: 05/16/2022 CLINICAL DATA:  Jammed pinky finger 3 weeks ago. EXAM: RIGHT HAND - COMPLETE 3+ VIEW COMPARISON:  None Available. FINDINGS: There is diffuse decreased bone mineralization. There is an oblique fracture of the proximal to mid shaft of the fifth metacarpal with 2 mm medial cortical step-off at the proximal medial aspect of the fracture line and 3 mm dorsal cortical step-off at the proximal dorsal aspect of the fracture on lateral view. Minimal medial and mild dorsal apex angulation of the fracture. Severe thumb carpometacarpal joint space narrowing with bone-on-bone contact, diffuse subchondral sclerosis/cystic change, and large peripheral degenerative osteophytes. Mild to moderate thumb interphalangeal joint space narrowing and peripheral osteophytosis with tiny ossicle at the dorsal aspect. Mild second and fifth DIP joint space narrowing. Minimal index finger metacarpophalangeal joint space narrowing and peripheral osteophytosis. Mild-to-moderate triscaphe joint space narrowing. Minimal 1 mm ulnar positive variance with lucent 7 x 3 mm (transverse by craniocaudal) cystic change within the adjacent proximal medial aspect of the lunate, suggesting the sequela of chronic ulnar abutment. IMPRESSION: 1. Mildly angulated oblique acute to subacute fracture of the proximal to mid shaft of the fifth  metacarpal. 2. Severe thumb carpometacarpal osteoarthritis. 3. Mild-to-moderate thumb interphalangeal and triscaphe osteoarthritis. Electronically Signed   By: Yvonne Kendall M.D.   On: 05/16/2022 09:46

## 2022-05-08 NOTE — Progress Notes (Unsigned)
[facility] [address] [place]. [place] [address] [phone]       FOLLOW UP VISIT NOTE  [name] Date of Birth:  1932-09-22 Medical Record Number:  [id number]   Referring MD: [facility] provider: Dr. Leonie Man  Reason for Referral: Neuropathy    Virtual Visit via Video Note  I connected with Tanya Harris on 05/08/22 at  3:15 PM EST by a video enabled telemedicine application and verified that I am speaking with the correct person using two identifiers.  Location: Patient: at home Provider: in office   I discussed the limitations of evaluation and management by telemedicine and the availability of in person appointments. The patient expressed understanding and agreed to proceed.    No chief complaint on file.     HPI:  Update 05/09/2022 JM: returns for 1 year f/u via MyChart video visit. Reports neuropathy has been stable since prior visit. Continues on gabapentin '300mg'$  nightly, tolerating well.       History provided for reference purposes only  Update 05/24/2021 JM: Patient returns for 1 year follow-up regarding neuropathy and medication management.  She has been stable since prior visit with continued benefit of gabapentin, denies side effects.  Denies any progression or changes in her neuropathy which has continued to be worse in left leg than right leg. No concerns at this time.  Update 05/23/2020 JM: Ms. Kawa returns for 63-monthfollow up via telephone for neuropathy and medication refill   She has been doing well since prior visit On gabapentin 300 mg nightly -tolerating without side effects Continued benefit of painful neuropathy symptoms  No concerns or questions at this time  Update 10/21/2019 JM: Ms. NSuriis being seen via virtual visit for follow-up regarding neuropathy. Currently on gabapentin 300 mg nightly tolerating dosage well and reports great benefit of neuropathy pain as well as improvement of her sleep at  night Occasionally experiences increased pain in the evening or that will awaken her at night. She will use topical OTC cream with benefit Denies daytime symptoms No concerns at this time  Update 05/28/2019 JM: Ms. NSchirmis a 89year old female who is being seen today, 05/28/2019, via virtual visit for follow-up regarding neuropathy.  At prior visit, Dr. SLeonie Manrecommended increasing Topamax dosage to eventual 100 mg daily but patient unable to tolerate due to reports of increased "shakiness" and headaches therefore currently on 50 mg nightly.  Recommend trialing 50 mg twice daily but patient declines need of daytime use as symptoms are only present at night.  She denies benefit at current dosage.  Discussion at prior visit regarding initiation of pregabalin but she reports contacting her insurance company and will be expensive for her to trial.  She has not previously tried gabapentin.  Her gait and balance have been stable.  No further concerns at this time.  Update 01/21/2019 Dr. SLeonie Man: She returns for follow-up after last visit 87 months ago.  She states she is tolerating Topamax 50 mg at night quite well without side effects but is not so sure that it is helping at all.  She does take trazodone 100 mg which helps her sleep.  She is not as much bothered during the day with the paresthesias and does not take any medicine for that.  She does apply Aspercreme locally which helps only temporarily for couple of hours.  She used to find some benefit with pool therapy but she is no longer able to do that because of knee  pain.  She feels her back pain has improved and Dr. Alcide Evener feels she does not need vertebroplasty anymore.  Patient did undergo neuropathy panel labs at last visit which were all normal except elevated ANA but she does not have any clinical symptomatology to suggest lupus.  This is likely a lab false positive.  EMG nerve conduction study done on 12/11/2018 confirmed axonal peripheral  polyneuropathy.  She continues to have paresthesias in the feet but feels her gait and balance are okay she has had no falls or injuries.  She does use a wheeled walker at all times.   Initial visit 10/27/2018 Dr. Phillips Climes. Tanya Harris is a 89 year old pleasant Caucasian lady seen today for initial office consultation visit for neuropathy.  History is obtained from the patient and her husband and review of referral notes.  Patient states that she has had bilateral tingling numbness in her feet for greater than 5 years while she was living in [place] and then moved to [place].  Primary care physician told her that she had a peripheral neuropathy but she did not see a neurologist and did not have EMG nerve conduction studies done.  Over the years she feels the paresthesias seem to be getting worse they have more severe now and constant.  They seem to be more with.  Is having activity and at night.  Previously they used to be at the bottom of her feet but now they seem to have progressed to involve up to her cough.  She is also noticed that her balance is off she stumbles a lot and her too often catches when she is trying to walk fast.  She has had a few minor falls but none in the last 1 year.  She has been using a walker for the last 4 years but mostly due to degenerative arthritis in her back as well as still need left knee surgery.  She denies any history of bowel bladder dysfunction, severe back injury or fall.  She saw Dr. Nelva Bush for pain in the low back radiating down the left leg.  X-ray of the lumbar spine were obtained on 09/29/2018 which I personally reviewed which showed just osteopenia without any compression fracture.  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  She has not had any recent EMG nerve conduction study or MRI scan of the lumbar spine.  She has not had any lab work for reversible causes of neuropathy.  She denies history of diabetes, alcohol intake or  exposure to toxic medications which can cause neuropathy.       PMH:  Past Medical History:  Diagnosis Date   Buckle type impacted fracture of the distal left radial metaphysis 05/04/2014   Closed nondisplaced fracture of styloid process of left ulna 05/04/2014   Coccygeal contusion 05/02/2015   COPD (chronic obstructive pulmonary disease) (HCC)    Mild   DJD (degenerative joint disease) of knee    Fall at home 05/04/2018   Rotator cuff tendinitis, left 01/03/2018   Scoliosis    Spinal stenosis     Social History:  Social History   Socioeconomic History   Marital status: Married    Spouse name: Not on file   Number of children: Not on file   Years of education: Not on file   Highest education level: Not on file  Occupational History   Not on file  Tobacco Use   Smoking status: Never   Smokeless tobacco: Never  Vaping Use  Vaping Use: Never used  Substance and Sexual Activity   Alcohol use: No   Drug use: Never   Sexual activity: Not on file  Other Topics Concern   Not on file  Social History Narrative   Moved from [place] to [place] in 2015   Social Determinants of Health   Financial Resource Strain: Low Risk  (04/07/2021)   Overall Financial Resource Strain (CARDIA)    Difficulty of Paying Living Expenses: Not hard at all  Food Insecurity: No Food Insecurity (04/07/2021)   Hunger Vital Sign    Worried About Running Out of Food in the Last Year: Never true    [place] in the Last Year: Never true  Transportation Needs: No Transportation Needs (04/07/2021)   PRAPARE - Hydrologist (Medical): No    Lack of Transportation (Non-Medical): No  Physical Activity: Insufficiently Active (06/22/2020)   Exercise Vital Sign    Days of Exercise per Week: 6 days    Minutes of Exercise per Session: 20 min  Stress: No Stress Concern Present (04/07/2021)   [place]    Feeling of Stress  : Not at all  Social Connections: Moderately Integrated (04/07/2021)   Social Connection and Isolation Panel [NHANES]    Frequency of Communication with Friends and Family: Twice a week    Frequency of Social Gatherings with Friends and Family: Twice a week    Attends Religious Services: More than 4 times per year    Active Member of [employer] or Organizations: No    Attends Archivist Meetings: Never    Marital Status: Married  Human resources officer Violence: Not At Risk (04/07/2021)   Humiliation, Afraid, Rape, and Kick questionnaire    Fear of Current or Ex-Partner: No    Emotionally Abused: No    Physically Abused: No    Sexually Abused: No    Medications:   Current Outpatient Medications on File Prior to Visit  Medication Sig Dispense Refill   acetaminophen-codeine (TYLENOL #3) 300-30 MG tablet Take 1 tablet by mouth every 6 (six) hours as needed for moderate pain. 30 tablet 0   alendronate (FOSAMAX) 70 MG tablet TAKE 1 TABLET(70 MG) BY MOUTH EVERY 7 DAYS WITH A FULL GLASS OF WATER AND ON AN EMPTY STOMACH 12 tablet 3   Calcium Carbonate-Vitamin D 600-400 MG-UNIT tablet Take 1 tablet by mouth daily. 800 units vitamin D     COVID-19 mRNA vaccine 2023-2024 (COMIRNATY) syringe Inject into the muscle. 0.3 mL 0   fluticasone (FLONASE) 50 MCG/ACT nasal spray Place 2 sprays into both nostrils daily. 9.9 g 11   gabapentin (NEURONTIN) 300 MG capsule Take 1 capsule (300 mg total) by mouth at bedtime. 90 capsule 3   meloxicam (MOBIC) 15 MG tablet TAKE 1 TABLET(15 MG) BY MOUTH DAILY AS NEEDED FOR PAIN 90 tablet 1   Multiple Vitamin (MULTIVITAMIN) tablet Take 1 tablet by mouth daily.     omega-3 fish oil (MAXEPA) 1000 MG CAPS capsule Take 1 capsule by mouth daily.      OVER THE COUNTER MEDICATION Apply 1 application topically as needed (neuropathy/feet). OTC Pain relieving foot cream: Magnilife DB     polyethylene glycol (MIRALAX / GLYCOLAX) packet Take 17 g by mouth daily. (Patient taking  differently: Take 17 g by mouth daily as needed.) 14 each 0   polyvinyl alcohol (LIQUIFILM TEARS) 1.4 % ophthalmic solution Place 1 drop into both eyes as  needed for dry eyes.     traZODone (DESYREL) 100 MG tablet Take 1 tablet (100 mg total) by mouth daily as needed. Use for insomnia 90 tablet 3   Trolamine Salicylate (ASPERCREME EX) Apply topically daily as needed.     UNABLE TO FIND Allergy shots weekly, Dr. Laurance Flatten.     WIXELA INHUB 250-50 MCG/ACT AEPB INHALE 1 PUFF INTO THE LUNGS TWICE DAILY 60 each 5   No current facility-administered medications on file prior to visit.    Allergies:   Allergies  Allergen Reactions   Ibuprofen Swelling    Mouth Swelling- however pt confirms that she is able to take mobic with no problems 11/2016    Amoxicillin Nausea And Vomiting    Did it involve swelling of the face/tongue/throat, SOB, or low BP? no Did it involve sudden or severe rash/hives, skin peeling, or any reaction on the inside of your mouth or nose? no Did you need to seek medical attention at a hospital or doctor's office? no  When did it last happen?      2005 If all above answers are "NO", may proceed with cephalosporin use.     Physical Exam There were no vitals filed for this visit.  There is no height or weight on file to calculate BMI.  General: well developed, well nourished, very pleasant elderly Caucasian female, seated, in no evident distress Head: head normocephalic and atraumatic.   Neck: supple with no carotid or supraclavicular bruits Cardiovascular: regular rate and rhythm, no murmurs Musculoskeletal: no deformity Skin:  no rash/petichiae Vascular:  Normal pulses all extremities   Neurologic Exam Mental Status: Awake and fully alert. Oriented to place and time. Recent and remote memory intact. Attention span, concentration and fund of knowledge appropriate. Mood and affect appropriate.  Cranial Nerves: Pupils equal, briskly reactive to light. Extraocular movements  full without nystagmus. Visual fields full to confrontation. Hearing intact. Facial sensation intact. Face, tongue, palate moves normally and symmetrically.  Motor: Normal bulk and tone. Normal strength in all tested extremity muscles Sensory.:  Decreased sensory bilaterally from ankle down; subjective numbness LLE from knee down to toes Coordination: Rapid alternating movements normal in all extremities. Finger-to-nose and heel-to-shin performed accurately bilaterally. Gait and Station: Arises from chair without difficulty. Stance is slightly hunched. Gait demonstrates normal stride length and mild imbalance with use of rollator walker.  Tandem walk and heel toe not attempted.  Reflexes: 1+ and symmetric. Toes downgoing.       ASSESSMENT/PLAN: 89 year old Caucasian lady with a longstanding history of bilateral lower extremity paresthesias and gait imbalance likely from longstanding peripheral neuropathy of undetermined etiology.  No benefit with topiramate     AXONAL NEUROPATHY -Stable without worsening -Continue gabapentin 300 mg nightly - 1 yr refill provided    Patient plans to discuss with PCP taking over medication management to limit routine follow-up visits. If PCP able to take over, can follow up here and call with any worsening symptoms. If unable to take over, recommend 1 year follow-up, okay for video visit   CC:  Copland, Gay Filler, MD    I spent 21 minutes of face-to-face and non-face-to-face time with patient.  This included previsit chart review, lab review, study review, order entry, electronic health record documentation, patient education and discussion regarding history of neuropathy, ongoing use of gabapentin and answered all other questions to patient satisfaction   CC:  [facility] provider: Copland, Gay Filler, MD   Frann Rider, AGNP-BC  [facility] [phone]  [place], [place] [address]  Phone [phone] Fax  [phone] Note: This document was prepared with digital dictation and possible smart phrase technology. Any transcriptional errors that result from this process are unintentional.

## 2022-05-09 ENCOUNTER — Encounter: Payer: Self-pay | Admitting: Adult Health

## 2022-05-09 ENCOUNTER — Telehealth (INDEPENDENT_AMBULATORY_CARE_PROVIDER_SITE_OTHER): Payer: Medicare Other | Admitting: Adult Health

## 2022-05-09 DIAGNOSIS — G6289 Other specified polyneuropathies: Secondary | ICD-10-CM | POA: Diagnosis not present

## 2022-05-09 DIAGNOSIS — J301 Allergic rhinitis due to pollen: Secondary | ICD-10-CM | POA: Diagnosis not present

## 2022-05-09 MED ORDER — GABAPENTIN 300 MG PO CAPS: 300.0000 mg | ORAL_CAPSULE | Freq: Every day | ORAL | 3 refills | Status: DC

## 2022-05-09 NOTE — Patient Instructions (Signed)
It was great to see you again today!   We will continue the current treatment plan with gabapentin 300 mg nightly, please call if evening symptoms worsen and we can add a lower dose gabapentin around that time   Follow up in 1 year or call earlier if needed

## 2022-05-16 ENCOUNTER — Ambulatory Visit (HOSPITAL_BASED_OUTPATIENT_CLINIC_OR_DEPARTMENT_OTHER)
Admission: RE | Admit: 2022-05-16 | Discharge: 2022-05-16 | Disposition: A | Discharge status: Home / Self Care | Payer: Medicare Other | Source: Ambulatory Visit | Attending: Family Medicine | Admitting: Family Medicine

## 2022-05-16 ENCOUNTER — Encounter: Payer: Self-pay | Admitting: Physician Assistant

## 2022-05-16 ENCOUNTER — Ambulatory Visit (INDEPENDENT_AMBULATORY_CARE_PROVIDER_SITE_OTHER): Payer: Medicare Other | Admitting: Physician Assistant

## 2022-05-16 ENCOUNTER — Ambulatory Visit (INDEPENDENT_AMBULATORY_CARE_PROVIDER_SITE_OTHER): Payer: Medicare Other | Admitting: Family Medicine

## 2022-05-16 VITALS — BP 140/80 | HR 79 | Temp 97.6°F | Resp 18 | Ht 61.0 in | Wt 127.8 lb

## 2022-05-16 DIAGNOSIS — J0110 Acute frontal sinusitis, unspecified: Secondary | ICD-10-CM | POA: Diagnosis not present

## 2022-05-16 DIAGNOSIS — M79644 Pain in right finger(s): Secondary | ICD-10-CM | POA: Insufficient documentation

## 2022-05-16 DIAGNOSIS — M1991 Primary osteoarthritis, unspecified site: Secondary | ICD-10-CM

## 2022-05-16 DIAGNOSIS — E785 Hyperlipidemia, unspecified: Secondary | ICD-10-CM

## 2022-05-16 DIAGNOSIS — S6291XA Unspecified fracture of right wrist and hand, initial encounter for closed fracture: Secondary | ICD-10-CM

## 2022-05-16 DIAGNOSIS — S62326A Displaced fracture of shaft of fifth metacarpal bone, right hand, initial encounter for closed fracture: Secondary | ICD-10-CM | POA: Diagnosis not present

## 2022-05-16 DIAGNOSIS — G8929 Other chronic pain: Secondary | ICD-10-CM

## 2022-05-16 DIAGNOSIS — M25562 Pain in left knee: Secondary | ICD-10-CM

## 2022-05-16 DIAGNOSIS — H9190 Unspecified hearing loss, unspecified ear: Secondary | ICD-10-CM

## 2022-05-16 DIAGNOSIS — F5101 Primary insomnia: Secondary | ICD-10-CM | POA: Diagnosis not present

## 2022-05-16 DIAGNOSIS — J4489 Other specified chronic obstructive pulmonary disease: Secondary | ICD-10-CM

## 2022-05-16 DIAGNOSIS — J302 Other seasonal allergic rhinitis: Secondary | ICD-10-CM | POA: Diagnosis not present

## 2022-05-16 DIAGNOSIS — S62307D Unspecified fracture of fifth metacarpal bone, left hand, subsequent encounter for fracture with routine healing: Secondary | ICD-10-CM | POA: Diagnosis not present

## 2022-05-16 MED ORDER — DOXYCYCLINE HYCLATE 100 MG PO TABS: 100.0000 mg | ORAL_TABLET | Freq: Two times a day (BID) | ORAL | 0 refills | Status: DC

## 2022-05-16 MED ORDER — ACETAMINOPHEN-CODEINE 300-30 MG PO TABS: 1.0000 | ORAL_TABLET | Freq: Four times a day (QID) | ORAL | 0 refills | Status: DC | PRN

## 2022-05-16 MED ORDER — TRAZODONE HCL 100 MG PO TABS: 100.0000 mg | ORAL_TABLET | Freq: Every day | ORAL | 3 refills | Status: DC | PRN

## 2022-05-16 MED ORDER — FLUTICASONE-SALMETEROL 250-50 MCG/ACT IN AEPB: INHALATION_SPRAY | RESPIRATORY_TRACT | 5 refills | Status: DC

## 2022-05-16 MED ORDER — MELOXICAM 15 MG PO TABS: ORAL_TABLET | ORAL | 1 refills | Status: DC

## 2022-05-16 MED ORDER — FLUTICASONE PROPIONATE 50 MCG/ACT NA SUSP: 2.0000 | Freq: Every day | NASAL | 11 refills | Status: DC

## 2022-05-16 NOTE — Progress Notes (Signed)
Office Visit Note   Patient: Tanya Harris           Date of Birth: 1932-03-09           MRN: [id number] Visit Date: 05/16/2022              Requested by: Darreld Mclean, MD [address] [place],  [place] [address] PCP: Darreld Mclean, MD   Assessment & Plan: Visit Diagnoses:  1. Closed displaced fracture of fifth metacarpal bone of left hand with routine healing, unspecified portion of metacarpal, subsequent encounter     Plan: Patient is a pleasant 89 year old woman who is referred today for evaluation of a right fifth metacarpal shaft fracture.  She said that about 2 or 3 weeks ago she hit her hand.  She did not have a lot of pain but did notice some swelling.  She does use a walker but was able to successfully use her walker without difficulty.  X-rays today that were done today demonstrated an acute to subacute fracture of the proximal fifth metacarpal shaft with some displacement.  She is very functional and is not wearing any type of splint today.  She has no significant pain with palpation.  She is able to oppose all her fingers.  I would like for her to wear a boxer's fracture splint just for a couple weeks just to protect this while it heals.  I have offered her a follow-up appointment but she said she would rather see how she does because it is difficult for her to get here.  Follow-Up Instructions: Return if symptoms worsen or fail to improve.   Orders:  No orders of the defined types were placed in this encounter.  No orders of the defined types were placed in this encounter.     Procedures: No procedures performed   Clinical Data: No additional findings.   Subjective: Chief Complaint  Patient presents with   Right Hand - Pain    HPI pleasant 89 year old woman right-hand-dominant referred today for evaluation of swelling in her right hand.  She saw her primary care provider and mention some swelling and pain she had in her hand for the  last few weeks after hitting her hand.  X-rays were done and demonstrated 1/5 metacarpal shaft fracture.  She comes in today moving her hand without any difficulty.  No previous history of injury.    Pain Assessment  Average Pain: 1 Current Pain: 1 Aggravating Factors: Opening a jar Alleviating Factors: Limiting movement  Review of Systems  All other systems reviewed and are negative.    Objective: Vital Signs: There were no vitals taken for this visit.  Physical Exam Constitutional:      Appearance: Normal appearance.  Pulmonary:     Effort: Pulmonary effort is normal.  Skin:    General: Skin is warm and dry.  Neurological:     General: No focal deficit present.     Mental Status: She is alert.     Ortho Exam Examination of her right hand she has a palpable radial pulse some mild soft tissue swelling over the fifth metacarpal.  She is able to oppose all her fingers she has mild tenderness to deep palpation no significant deformity she is neurovascular intact Specialty Comments:  No specialty comments available.  Imaging: DG Hand Complete Right  Result Date: 05/16/2022 CLINICAL DATA:  Jammed pinky finger 3 weeks ago. EXAM: RIGHT HAND - COMPLETE 3+ VIEW COMPARISON:  None  Available. FINDINGS: There is diffuse decreased bone mineralization. There is an oblique fracture of the proximal to mid shaft of the fifth metacarpal with 2 mm medial cortical step-off at the proximal medial aspect of the fracture line and 3 mm dorsal cortical step-off at the proximal dorsal aspect of the fracture on lateral view. Minimal medial and mild dorsal apex angulation of the fracture. Severe thumb carpometacarpal joint space narrowing with bone-on-bone contact, diffuse subchondral sclerosis/cystic change, and large peripheral degenerative osteophytes. Mild to moderate thumb interphalangeal joint space narrowing and peripheral osteophytosis with tiny ossicle at the dorsal aspect. Mild second and fifth  DIP joint space narrowing. Minimal index finger metacarpophalangeal joint space narrowing and peripheral osteophytosis. Mild-to-moderate triscaphe joint space narrowing. Minimal 1 mm ulnar positive variance with lucent 7 x 3 mm (transverse by craniocaudal) cystic change within the adjacent proximal medial aspect of the lunate, suggesting the sequela of chronic ulnar abutment. IMPRESSION: 1. Mildly angulated oblique acute to subacute fracture of the proximal to mid shaft of the fifth metacarpal. 2. Severe thumb carpometacarpal osteoarthritis. 3. Mild-to-moderate thumb interphalangeal and triscaphe osteoarthritis. Electronically Signed   By: Yvonne Kendall M.D.   On: 05/16/2022 09:46     PMFS History: Patient Active Problem List   Diagnosis Date Noted   Scoliosis, levoconvex 05/04/2018   Strangulated left femoral hernia s/p lap SB resction & hernia repair 05/04/2018 05/04/2018   SBO (small bowel obstruction) ([place]) 05/04/2018   Fibroid uterus 05/04/2018   Abdominal wall asymmetry 05/04/2018   Diverticulosis of left colon 05/04/2018   Hyperglycemia 05/04/2018   Spinal stenosis    Rotator cuff tendinitis, left 01/03/2018   History of excessive cerumen 12/04/2017   Chronic pain of left knee 10/01/2017   Fall at home 05/03/2017   Lesion of skin of breast 12/12/2015   HOH (hard of hearing) 12/08/2014   Hyperlipidemia 06/04/2014   Buckle type impacted fracture of the distal left radial metaphysis 05/04/2014   Closed nondisplaced fracture of styloid process of left ulna 05/04/2014   Chronic allergic rhinitis 04/15/2014   COPD with chronic bronchitis ([place]) 11/02/2013   Osteopenia 11/02/2013   Gait instability 11/02/2013   Past Medical History:  Diagnosis Date   Buckle type impacted fracture of the distal left radial metaphysis 05/04/2014   Closed nondisplaced fracture of styloid process of left ulna 05/04/2014   Coccygeal contusion 05/02/2015   COPD (chronic obstructive pulmonary disease) (HCC)     Mild   DJD (degenerative joint disease) of knee    Fall at home 05/04/2018   Rotator cuff tendinitis, left 01/03/2018   Scoliosis    Spinal stenosis     Family History  Problem Relation Age of Onset   Heart disease Mother 61       Deceased   Diabetes Mother    Heart disease Father 59       Deceased   Asthma Father    Heart disease Maternal Uncle    Colon cancer Brother    Diabetes Brother        #2   Heart disease Brother        x2   Healthy Sister    Healthy Brother        x1   Hyperlipidemia Son        x2    Past Surgical History:  Procedure Laterality Date   APPENDECTOMY  2005   CATARACT EXTRACTION     Bilateral   LAPAROSCOPY N/A 05/04/2018   Procedure: LAPAROSCOPY DIAGNOSTIC , FEMORAL  HERNIA REPAIR WITH SMALL BOWEL RESECTION WITH TAP BLOCK;  Surgeon: Michael Boston, MD;  Location: [facility];  Service: General;  Laterality: N/A;   NASAL SINUS SURGERY     TONSILLECTOMY     TOTAL KNEE ARTHROPLASTY  2001   Left   Social History   Occupational History   Not on file  Tobacco Use   Smoking status: Never   Smokeless tobacco: Never  Vaping Use   Vaping Use: Never used  Substance and Sexual Activity   Alcohol use: No   Drug use: Never   Sexual activity: Not on file

## 2022-05-30 DIAGNOSIS — J301 Allergic rhinitis due to pollen: Secondary | ICD-10-CM | POA: Diagnosis not present

## 2022-06-06 DIAGNOSIS — J301 Allergic rhinitis due to pollen: Secondary | ICD-10-CM | POA: Diagnosis not present

## 2022-06-11 DIAGNOSIS — J301 Allergic rhinitis due to pollen: Secondary | ICD-10-CM | POA: Diagnosis not present

## 2022-06-20 DIAGNOSIS — J301 Allergic rhinitis due to pollen: Secondary | ICD-10-CM | POA: Diagnosis not present

## 2022-06-27 DIAGNOSIS — J301 Allergic rhinitis due to pollen: Secondary | ICD-10-CM | POA: Diagnosis not present

## 2022-07-04 DIAGNOSIS — J301 Allergic rhinitis due to pollen: Secondary | ICD-10-CM | POA: Diagnosis not present

## 2022-07-11 DIAGNOSIS — J301 Allergic rhinitis due to pollen: Secondary | ICD-10-CM | POA: Diagnosis not present

## 2022-07-12 DIAGNOSIS — J301 Allergic rhinitis due to pollen: Secondary | ICD-10-CM | POA: Diagnosis not present

## 2022-07-16 DIAGNOSIS — J301 Allergic rhinitis due to pollen: Secondary | ICD-10-CM | POA: Diagnosis not present

## 2022-07-23 DIAGNOSIS — J301 Allergic rhinitis due to pollen: Secondary | ICD-10-CM | POA: Diagnosis not present

## 2022-08-01 DIAGNOSIS — J301 Allergic rhinitis due to pollen: Secondary | ICD-10-CM | POA: Diagnosis not present

## 2022-08-15 DIAGNOSIS — J301 Allergic rhinitis due to pollen: Secondary | ICD-10-CM | POA: Diagnosis not present

## 2022-08-22 DIAGNOSIS — J301 Allergic rhinitis due to pollen: Secondary | ICD-10-CM | POA: Diagnosis not present

## 2022-08-23 DIAGNOSIS — J301 Allergic rhinitis due to pollen: Secondary | ICD-10-CM | POA: Diagnosis not present

## 2022-08-27 DIAGNOSIS — J301 Allergic rhinitis due to pollen: Secondary | ICD-10-CM | POA: Diagnosis not present

## 2022-09-03 DIAGNOSIS — J301 Allergic rhinitis due to pollen: Secondary | ICD-10-CM | POA: Diagnosis not present

## 2022-09-12 DIAGNOSIS — J301 Allergic rhinitis due to pollen: Secondary | ICD-10-CM | POA: Diagnosis not present

## 2022-09-26 DIAGNOSIS — J301 Allergic rhinitis due to pollen: Secondary | ICD-10-CM | POA: Diagnosis not present

## 2022-10-01 DIAGNOSIS — J301 Allergic rhinitis due to pollen: Secondary | ICD-10-CM | POA: Diagnosis not present

## 2022-10-08 DIAGNOSIS — J301 Allergic rhinitis due to pollen: Secondary | ICD-10-CM | POA: Diagnosis not present

## 2022-10-15 DIAGNOSIS — J301 Allergic rhinitis due to pollen: Secondary | ICD-10-CM | POA: Diagnosis not present

## 2022-10-17 DIAGNOSIS — J301 Allergic rhinitis due to pollen: Secondary | ICD-10-CM | POA: Diagnosis not present

## 2022-10-17 DIAGNOSIS — J019 Acute sinusitis, unspecified: Secondary | ICD-10-CM | POA: Diagnosis not present

## 2022-10-20 NOTE — Progress Notes (Addendum)
[facility] [address] [place], [place] [address] [phone]  Date:  10/25/2022   Name:  Tanya Harris   DOB:  12-23-1932   MRN:  [id number]  PCP:  Pearline Cables, MD    Chief Complaint: Follow-up (Concerns/ questions: none/AWV due)   History of Present Illness:  Tanya Harris is a 89 y.o. very pleasant female patient who presents with the following:  Patient seen today for follow-up Most recent visit with myself was in October Older lady with history of COPD, hearing loss, osteopenia, hyperlipidemia, spinal stenosis, peripheral neuropathy   Recommend COVID booster, flu shot this fall At our last visit patient expressed that she no longer wishes to do mammograms-she confirms this is still true Can offer bone density-most recent 10/21, osteopenia Can update blood work today  Fosamax-might be able to stop this depending on bone density results For now she really prefers to just continue fosamax, doing a bone density is hard for her with her back and she notes no side effects from Fosamax Wixela inhaler twice a day Gabapentin Trazodone at bedtime as needed  She notes she is using doxycycline right now due to to a sinus infection-her allergies have been more problematic this spring  She is using miralax twice a week right now for her constipation   Her back is not really painful, but her kyphosis is severe which is inconvenience  She tries to walk around as much as possible  She lives in a single family home with her husband Renae Fickle- single level  She has not driven for 2 years- she feels uncomfortable driving at this point and Renae Fickle is able to take care of it  They have been married for 68 years!   Patient Active Problem List   Diagnosis Date Noted   Scoliosis, levoconvex 05/04/2018   Strangulated left femoral hernia s/p lap SB resction & hernia repair 05/04/2018 05/04/2018   SBO (small bowel obstruction)  (HCC) 05/04/2018   Fibroid uterus 05/04/2018   Abdominal wall asymmetry 05/04/2018   Diverticulosis of left colon 05/04/2018   Hyperglycemia 05/04/2018   Spinal stenosis    Rotator cuff tendinitis, left 01/03/2018   History of excessive cerumen 12/04/2017   Chronic pain of left knee 10/01/2017   Fall at home 05/03/2017   Lesion of skin of breast 12/12/2015   HOH (hard of hearing) 12/08/2014   Hyperlipidemia 06/04/2014   Buckle type impacted fracture of the distal left radial metaphysis 05/04/2014   Closed nondisplaced fracture of styloid process of left ulna 05/04/2014   Chronic allergic rhinitis 04/15/2014   COPD with chronic bronchitis (HCC) 11/02/2013   Osteopenia 11/02/2013   Gait instability 11/02/2013    Past Medical History:  Diagnosis Date   Buckle type impacted fracture of the distal left radial metaphysis 05/04/2014   Closed nondisplaced fracture of styloid process of left ulna 05/04/2014   Coccygeal contusion 05/02/2015   COPD (chronic obstructive pulmonary disease) (HCC)    Mild   DJD (degenerative joint disease) of knee    Fall at home 05/04/2018   Rotator cuff tendinitis, left 01/03/2018   Scoliosis    Spinal stenosis     Past Surgical History:  Procedure Laterality Date   APPENDECTOMY  2005   CATARACT EXTRACTION     Bilateral   LAPAROSCOPY N/A 05/04/2018   Procedure: LAPAROSCOPY DIAGNOSTIC , FEMORAL  HERNIA REPAIR WITH SMALL BOWEL RESECTION WITH TAP BLOCK;  Surgeon: Michaell Cowing,  Viviann Spare, MD;  Location: [facility];  Service: General;  Laterality: N/A;   NASAL SINUS SURGERY     TONSILLECTOMY     TOTAL KNEE ARTHROPLASTY  2001   Left    Social History   Tobacco Use   Smoking status: Never   Smokeless tobacco: Never  Vaping Use   Vaping status: Never Used  Substance Use Topics   Alcohol use: No   Drug use: Never    Family History  Problem Relation Age of Onset   Heart disease Mother 67       Deceased   Diabetes Mother    Heart disease Father 64       Deceased    Asthma Father    Heart disease Maternal Uncle    Colon cancer Brother    Diabetes Brother        #2   Heart disease Brother        x2   Healthy Sister    Healthy Brother        x1   Hyperlipidemia Son        x2    Allergies  Allergen Reactions   Ibuprofen Swelling    Mouth Swelling- however pt confirms that she is able to take mobic with no problems 11/2016    Amoxicillin Nausea And Vomiting    Did it involve swelling of the face/tongue/throat, SOB, or low BP? no Did it involve sudden or severe rash/hives, skin peeling, or any reaction on the inside of your mouth or nose? no Did you need to seek medical attention at a hospital or doctor's office? no  When did it last happen?      2005 If all above answers are "NO", may proceed with cephalosporin use.     Medication list has been reviewed and updated.  Current Outpatient Medications on File Prior to Visit  Medication Sig Dispense Refill   acetaminophen-codeine (TYLENOL #3) 300-30 MG tablet Take 1 tablet by mouth every 6 (six) hours as needed for moderate pain. 30 tablet 0   alendronate (FOSAMAX) 70 MG tablet TAKE 1 TABLET(70 MG) BY MOUTH EVERY 7 DAYS WITH A FULL GLASS OF WATER AND ON AN EMPTY STOMACH 12 tablet 3   Calcium Carbonate-Vitamin D 600-400 MG-UNIT tablet Take 1 tablet by mouth daily. 800 units vitamin D     doxycycline (VIBRAMYCIN) 100 MG capsule Take by mouth.     fluticasone (FLONASE) 50 MCG/ACT nasal spray Place 2 sprays into both nostrils daily. 9.9 g 11   fluticasone-salmeterol (WIXELA INHUB) 250-50 MCG/ACT AEPB INHALE 1 PUFF INTO THE LUNGS TWICE DAILY 60 each 5   gabapentin (NEURONTIN) 300 MG capsule Take 1 capsule (300 mg total) by mouth at bedtime. 90 capsule 3   meloxicam (MOBIC) 15 MG tablet TAKE 1 TABLET(15 MG) BY MOUTH DAILY AS NEEDED FOR PAIN 90 tablet 1   Multiple Vitamin (MULTIVITAMIN) tablet Take 1 tablet by mouth daily.     omega-3 fish oil (MAXEPA) 1000 MG CAPS capsule Take 1 capsule by mouth daily.       polyethylene glycol (MIRALAX / GLYCOLAX) packet Take 17 g by mouth daily. (Patient taking differently: Take 17 g by mouth daily as needed.) 14 each 0   polyvinyl alcohol (LIQUIFILM TEARS) 1.4 % ophthalmic solution Place 1 drop into both eyes as needed for dry eyes.     traZODone (DESYREL) 100 MG tablet Take 1 tablet (100 mg total) by mouth daily as needed. Use for insomnia 90 tablet 3  UNABLE TO FIND Allergy shots weekly, Dr. Christell Constant.     No current facility-administered medications on file prior to visit.    Review of Systems:  As per HPI- otherwise negative.   Physical Examination: Vitals:   10/25/22 0854  BP: 118/60  Pulse: 63  Resp: 18  Temp: 98.1 F (36.7 C)  SpO2: 97%   Vitals:   10/25/22 0854  Weight: 123 lb (55.8 kg)  Height: 5\' 1"  (1.549 m)   Body mass index is 23.24 kg/m. Ideal Body Weight: Weight in (lb) to have BMI = 25: 132  GEN: no acute distress. Normal weight, looks well -uses rollator to ambulate, significant thoracic kyphosis HEENT: Atraumatic, Normocephalic.  Ears and Nose: No external deformity. CV: RRR, No M/G/R. No JVD. No thrill. No extra heart sounds. PULM: CTA B, no wheezes, crackles, rhonchi. No retractions. No resp. distress. No accessory muscle use. ABD: S, NT, ND EXTR: No c/c/e PSYCH: Normally interactive. Conversant.    Assessment and Plan: Hyperlipidemia, unspecified hyperlipidemia type - Plan: Lipid panel  COPD with chronic bronchitis (HCC)  Hearing loss, unspecified hearing loss type, unspecified laterality  Osteopenia, unspecified location  Osteopenia of forearm, unspecified laterality - Plan: alendronate (FOSAMAX) 70 MG tablet  Seasonal allergies - Plan: fluticasone (FLONASE) 50 MCG/ACT nasal spray  Hyperglycemia - Plan: Comprehensive metabolic panel, Hemoglobin A1c  Screening for deficiency anemia - Plan: CBC  Screening for thyroid disorder - Plan: TSH  Medication monitoring encounter - Plan: CBC  Patient seen  today for follow-up.  Lab work is pending as above.  Will continue Fosamax per her request Encouraged flu shot and COVID booster this fall Will plan further follow- up pending labs. With lab results, will also advise patient about stool softener which we had discussed.  Would recommend adding a daily docusate sodium to her MiraLAX routine for more complete control constipation  Signed Abbe Amsterdam, MD  Addendum 8/23, received labs as below.  Message to patient. Results for orders placed or performed in visit on 10/25/22  CBC  Result Value Ref Range   WBC 4.3 4.0 - 10.5 K/uL   RBC 4.69 3.87 - 5.11 Mil/uL   Platelets 243.0 150.0 - 400.0 K/uL   Hemoglobin 14.5 12.0 - 15.0 g/dL   HCT 16.1 09.6 - 04.5 %   MCV 90.9 78.0 - 100.0 fl   MCHC 33.9 30.0 - 36.0 g/dL   RDW 40.9 81.1 - 91.4 %  Comprehensive metabolic panel  Result Value Ref Range   Sodium 130 (L) 135 - 145 mEq/L   Potassium 4.3 3.5 - 5.1 mEq/L   Chloride 95 (L) 96 - 112 mEq/L   CO2 30 19 - 32 mEq/L   Glucose, Bld 99 70 - 99 mg/dL   BUN 20 6 - 23 mg/dL   Creatinine, Ser 7.82 0.40 - 1.20 mg/dL   Total Bilirubin 0.5 0.2 - 1.2 mg/dL   Alkaline Phosphatase 85 39 - 117 U/L   AST 24 0 - 37 U/L   ALT 14 0 - 35 U/L   Total Protein 7.1 6.0 - 8.3 g/dL   Albumin 3.9 3.5 - 5.2 g/dL   GFR 95.62 >13.08 mL/min   Calcium 9.6 8.4 - 10.5 mg/dL  Hemoglobin M5H  Result Value Ref Range   Hgb A1c MFr Bld 5.6 4.6 - 6.5 %  Lipid panel  Result Value Ref Range   Cholesterol 218 (H) 0 - 200 mg/dL   Triglycerides 84.6 0.0 - 149.0 mg/dL   HDL 96.29 >52.84  mg/dL   VLDL 01.0 0.0 - 27.2 mg/dL   LDL Cholesterol 536 (H) 0 - 99 mg/dL   Total CHOL/HDL Ratio 4    NonHDL 156.35   TSH  Result Value Ref Range   TSH 0.80 0.35 - 5.50 uIU/mL

## 2022-10-25 ENCOUNTER — Ambulatory Visit (INDEPENDENT_AMBULATORY_CARE_PROVIDER_SITE_OTHER): Payer: Medicare Other | Admitting: Family Medicine

## 2022-10-25 VITALS — BP 118/60 | HR 63 | Temp 98.1°F | Resp 18 | Ht 61.0 in | Wt 123.0 lb

## 2022-10-25 DIAGNOSIS — H9190 Unspecified hearing loss, unspecified ear: Secondary | ICD-10-CM

## 2022-10-25 DIAGNOSIS — E785 Hyperlipidemia, unspecified: Secondary | ICD-10-CM | POA: Diagnosis not present

## 2022-10-25 DIAGNOSIS — E871 Hypo-osmolality and hyponatremia: Secondary | ICD-10-CM | POA: Diagnosis not present

## 2022-10-25 DIAGNOSIS — J4489 Other specified chronic obstructive pulmonary disease: Secondary | ICD-10-CM

## 2022-10-25 DIAGNOSIS — Z1329 Encounter for screening for other suspected endocrine disorder: Secondary | ICD-10-CM | POA: Diagnosis not present

## 2022-10-25 DIAGNOSIS — M85839 Other specified disorders of bone density and structure, unspecified forearm: Secondary | ICD-10-CM

## 2022-10-25 DIAGNOSIS — M8580 Other specified disorders of bone density and structure, unspecified site: Secondary | ICD-10-CM | POA: Diagnosis not present

## 2022-10-25 DIAGNOSIS — Z5181 Encounter for therapeutic drug level monitoring: Secondary | ICD-10-CM | POA: Diagnosis not present

## 2022-10-25 DIAGNOSIS — Z130 Encounter for screening for diseases of the blood and blood-forming organs and certain disorders involving the immune mechanism: Secondary | ICD-10-CM

## 2022-10-25 DIAGNOSIS — J302 Other seasonal allergic rhinitis: Secondary | ICD-10-CM | POA: Diagnosis not present

## 2022-10-25 DIAGNOSIS — R739 Hyperglycemia, unspecified: Secondary | ICD-10-CM | POA: Diagnosis not present

## 2022-10-25 LAB — COMPREHENSIVE METABOLIC PANEL
ALT: 14 U/L (ref 0–35)
AST: 24 U/L (ref 0–37)
Albumin: 3.9 g/dL (ref 3.5–5.2)
Alkaline Phosphatase: 85 U/L (ref 39–117)
BUN: 20 mg/dL (ref 6–23)
CO2: 30 meq/L (ref 19–32)
Calcium: 9.6 mg/dL (ref 8.4–10.5)
Chloride: 95 meq/L — ABNORMAL LOW (ref 96–112)
Creatinine, Ser: 0.47 mg/dL (ref 0.40–1.20)
GFR: 85.21 mL/min (ref >60.00)
Glucose, Bld: 99 mg/dL (ref 70–99)
Potassium: 4.3 meq/L (ref 3.5–5.1)
Sodium: 130 meq/L — ABNORMAL LOW (ref 135–145)
Total Bilirubin: 0.5 mg/dL (ref 0.2–1.2)
Total Protein: 7.1 g/dL (ref 6.0–8.3)

## 2022-10-25 LAB — CBC
HCT: 42.6 % (ref 36.0–46.0)
Hemoglobin: 14.5 g/dL (ref 12.0–15.0)
MCHC: 33.9 g/dL (ref 30.0–36.0)
MCV: 90.9 fl (ref 78.0–100.0)
Platelets: 243 10*3/uL (ref 150.0–400.0)
RBC: 4.69 Mil/uL (ref 3.87–5.11)
RDW: 13.6 % (ref 11.5–15.5)
WBC: 4.3 10*3/uL (ref 4.0–10.5)

## 2022-10-25 LAB — LIPID PANEL
Cholesterol: 218 mg/dL — ABNORMAL HIGH (ref 0–200)
HDL: 61.9 mg/dL (ref >39.00)
LDL Cholesterol: 138 mg/dL — ABNORMAL HIGH (ref 0–99)
NonHDL: 156.35
Total CHOL/HDL Ratio: 4
Triglycerides: 94 mg/dL (ref 0.0–149.0)
VLDL: 18.8 mg/dL (ref 0.0–40.0)

## 2022-10-25 LAB — HEMOGLOBIN A1C: Hgb A1c MFr Bld: 5.6 % (ref 4.6–6.5)

## 2022-10-25 MED ORDER — FLUTICASONE PROPIONATE 50 MCG/ACT NA SUSP: 2.0000 | Freq: Every day | NASAL | 11 refills | Status: DC

## 2022-10-25 MED ORDER — ALENDRONATE SODIUM 70 MG PO TABS: ORAL_TABLET | ORAL | 3 refills | Status: DC

## 2022-10-25 NOTE — Patient Instructions (Signed)
It was great to see you today- I will be in touch with your labs Recommend flu shot and covid booster this fall  If all is well please see me in about 6 months

## 2022-10-26 ENCOUNTER — Encounter: Payer: Self-pay | Admitting: Family Medicine

## 2022-10-26 LAB — TSH: TSH: 0.8 u[IU]/mL (ref 0.35–5.50)

## 2022-10-26 NOTE — Addendum Note (Signed)
Addended by: Abbe Amsterdam C on: 10/26/2022 02:41 PM   Modules accepted: Orders

## 2022-10-29 DIAGNOSIS — J301 Allergic rhinitis due to pollen: Secondary | ICD-10-CM | POA: Diagnosis not present

## 2022-11-07 DIAGNOSIS — J301 Allergic rhinitis due to pollen: Secondary | ICD-10-CM | POA: Diagnosis not present

## 2022-11-14 DIAGNOSIS — J301 Allergic rhinitis due to pollen: Secondary | ICD-10-CM | POA: Diagnosis not present

## 2022-11-19 DIAGNOSIS — J301 Allergic rhinitis due to pollen: Secondary | ICD-10-CM | POA: Diagnosis not present

## 2022-11-28 DIAGNOSIS — J301 Allergic rhinitis due to pollen: Secondary | ICD-10-CM | POA: Diagnosis not present

## 2022-11-29 DIAGNOSIS — J301 Allergic rhinitis due to pollen: Secondary | ICD-10-CM | POA: Diagnosis not present

## 2022-11-30 ENCOUNTER — Other Ambulatory Visit (HOSPITAL_BASED_OUTPATIENT_CLINIC_OR_DEPARTMENT_OTHER): Payer: Self-pay

## 2022-11-30 DIAGNOSIS — Z23 Encounter for immunization: Secondary | ICD-10-CM | POA: Diagnosis not present

## 2022-11-30 MED ORDER — FLUAD 0.5 ML IM SUSY
PREFILLED_SYRINGE | INTRAMUSCULAR | 0 refills | Status: DC
  Filled 2022-11-30: qty 0.5, 1d supply, fill #0

## 2022-11-30 MED ORDER — COMIRNATY 30 MCG/0.3ML IM SUSY
PREFILLED_SYRINGE | INTRAMUSCULAR | 0 refills | Status: DC
  Filled 2022-11-30: qty 0.3, 1d supply, fill #0

## 2022-12-03 DIAGNOSIS — J301 Allergic rhinitis due to pollen: Secondary | ICD-10-CM | POA: Diagnosis not present

## 2022-12-10 DIAGNOSIS — J301 Allergic rhinitis due to pollen: Secondary | ICD-10-CM | POA: Diagnosis not present

## 2022-12-10 DIAGNOSIS — J0101 Acute recurrent maxillary sinusitis: Secondary | ICD-10-CM | POA: Diagnosis not present

## 2022-12-17 DIAGNOSIS — J301 Allergic rhinitis due to pollen: Secondary | ICD-10-CM | POA: Diagnosis not present

## 2022-12-26 DIAGNOSIS — J301 Allergic rhinitis due to pollen: Secondary | ICD-10-CM | POA: Diagnosis not present

## 2023-01-02 DIAGNOSIS — J301 Allergic rhinitis due to pollen: Secondary | ICD-10-CM | POA: Diagnosis not present

## 2023-01-03 DIAGNOSIS — J301 Allergic rhinitis due to pollen: Secondary | ICD-10-CM | POA: Diagnosis not present

## 2023-01-09 DIAGNOSIS — J301 Allergic rhinitis due to pollen: Secondary | ICD-10-CM | POA: Diagnosis not present

## 2023-01-14 DIAGNOSIS — J301 Allergic rhinitis due to pollen: Secondary | ICD-10-CM | POA: Diagnosis not present

## 2023-01-21 DIAGNOSIS — J301 Allergic rhinitis due to pollen: Secondary | ICD-10-CM | POA: Diagnosis not present

## 2023-01-30 DIAGNOSIS — J301 Allergic rhinitis due to pollen: Secondary | ICD-10-CM | POA: Diagnosis not present

## 2023-02-04 DIAGNOSIS — J301 Allergic rhinitis due to pollen: Secondary | ICD-10-CM | POA: Diagnosis not present

## 2023-02-13 DIAGNOSIS — J301 Allergic rhinitis due to pollen: Secondary | ICD-10-CM | POA: Diagnosis not present

## 2023-02-18 DIAGNOSIS — J301 Allergic rhinitis due to pollen: Secondary | ICD-10-CM | POA: Diagnosis not present

## 2023-02-28 DIAGNOSIS — J301 Allergic rhinitis due to pollen: Secondary | ICD-10-CM | POA: Diagnosis not present

## 2023-03-13 DIAGNOSIS — J301 Allergic rhinitis due to pollen: Secondary | ICD-10-CM | POA: Diagnosis not present

## 2023-03-27 DIAGNOSIS — J301 Allergic rhinitis due to pollen: Secondary | ICD-10-CM | POA: Diagnosis not present

## 2023-03-28 DIAGNOSIS — J301 Allergic rhinitis due to pollen: Secondary | ICD-10-CM | POA: Diagnosis not present

## 2023-04-01 DIAGNOSIS — J301 Allergic rhinitis due to pollen: Secondary | ICD-10-CM | POA: Diagnosis not present

## 2023-04-08 DIAGNOSIS — J301 Allergic rhinitis due to pollen: Secondary | ICD-10-CM | POA: Diagnosis not present

## 2023-04-15 DIAGNOSIS — J301 Allergic rhinitis due to pollen: Secondary | ICD-10-CM | POA: Diagnosis not present

## 2023-04-19 NOTE — Patient Instructions (Incomplete)
It was great to see you today, I will be in touch with your lab work  Recommend a dose of RSV vaccine if not done already

## 2023-04-19 NOTE — Progress Notes (Deleted)
 [facility] [address], [address] [place], [place] [address] [phone]  Date:  04/24/2023   Name:  Tanya Harris   DOB:  01/30/1933   MRN:  [id number]  PCP:  Pearline Cables, MD    Chief Complaint: No chief complaint on file.   History of Present Illness:  Tanya Harris is a 89 y.o. very pleasant female patient who presents with the following:  Patient seen today for periodic follow-up Most recent visit with myself was in August Older lady with history of COPD, hearing loss, osteopenia, hyperlipidemia, spinal stenosis, peripheral neuropathy   She has fairly severe kyphosis which causes her some difficulty with function  Married to Harvey for over 65 years Flu shot, shingles complete, COVID up-to-date Recommend RSV Most recent labs from August  Fosamax-on previous discussion patient indicated she prefers to continue taking Fosamax and stopped doing DEXA scans. Gabapentin 300 at bedtime Meloxicam as needed Omega-3 Trazodone as needed Patient Active Problem List   Diagnosis Date Noted   Scoliosis, levoconvex 05/04/2018   Strangulated left femoral hernia s/p lap SB resction & hernia repair 05/04/2018 05/04/2018   SBO (small bowel obstruction) (HCC) 05/04/2018   Fibroid uterus 05/04/2018   Abdominal wall asymmetry 05/04/2018   Diverticulosis of left colon 05/04/2018   Hyperglycemia 05/04/2018   Spinal stenosis    Rotator cuff tendinitis, left 01/03/2018   History of excessive cerumen 12/04/2017   Chronic pain of left knee 10/01/2017   Fall at home 05/03/2017   Lesion of skin of breast 12/12/2015   HOH (hard of hearing) 12/08/2014   Hyperlipidemia 06/04/2014   Buckle type impacted fracture of the distal left radial metaphysis 05/04/2014   Closed nondisplaced fracture of styloid process of left ulna 05/04/2014   Chronic allergic rhinitis 04/15/2014   COPD with chronic bronchitis (HCC) 11/02/2013   Osteopenia  11/02/2013   Gait instability 11/02/2013    Past Medical History:  Diagnosis Date   Buckle type impacted fracture of the distal left radial metaphysis 05/04/2014   Closed nondisplaced fracture of styloid process of left ulna 05/04/2014   Coccygeal contusion 05/02/2015   COPD (chronic obstructive pulmonary disease) (HCC)    Mild   DJD (degenerative joint disease) of knee    Fall at home 05/04/2018   Rotator cuff tendinitis, left 01/03/2018   Scoliosis    Spinal stenosis     Past Surgical History:  Procedure Laterality Date   APPENDECTOMY  2005   CATARACT EXTRACTION     Bilateral   LAPAROSCOPY N/A 05/04/2018   Procedure: LAPAROSCOPY DIAGNOSTIC , FEMORAL  HERNIA REPAIR WITH SMALL BOWEL RESECTION WITH TAP BLOCK;  Surgeon: Karie Soda, MD;  Location: [facility];  Service: General;  Laterality: N/A;   NASAL SINUS SURGERY     TONSILLECTOMY     TOTAL KNEE ARTHROPLASTY  2001   Left    Social History   Tobacco Use   Smoking status: Never   Smokeless tobacco: Never  Vaping Use   Vaping status: Never Used  Substance Use Topics   Alcohol use: No   Drug use: Never    Family History  Problem Relation Age of Onset   Heart disease Mother 33       Deceased   Diabetes Mother    Heart disease Father 55       Deceased   Asthma Father    Heart disease Maternal Uncle    Colon cancer Brother  Diabetes Brother        #2   Heart disease Brother        x2   Healthy Sister    Healthy Brother        x1   Hyperlipidemia Son        x2    Allergies  Allergen Reactions   Ibuprofen Swelling    Mouth Swelling- however pt confirms that she is able to take mobic with no problems 11/2016    Amoxicillin Nausea And Vomiting    Did it involve swelling of the face/tongue/throat, SOB, or low BP? no Did it involve sudden or severe rash/hives, skin peeling, or any reaction on the inside of your mouth or nose? no Did you need to seek medical attention at a hospital or doctor's office? no  When did it  last happen?      2005 If all above answers are "NO", may proceed with cephalosporin use.     Medication list has been reviewed and updated.  Current Outpatient Medications on File Prior to Visit  Medication Sig Dispense Refill   acetaminophen-codeine (TYLENOL #3) 300-30 MG tablet Take 1 tablet by mouth every 6 (six) hours as needed for moderate pain. 30 tablet 0   alendronate (FOSAMAX) 70 MG tablet TAKE 1 TABLET(70 MG) BY MOUTH EVERY 7 DAYS WITH A FULL GLASS OF WATER AND ON AN EMPTY STOMACH 12 tablet 3   Calcium Carbonate-Vitamin D 600-400 MG-UNIT tablet Take 1 tablet by mouth daily. 800 units vitamin D     COVID-19 mRNA vaccine, Pfizer, (COMIRNATY) syringe Inject into the muscle. 0.3 mL 0   fluticasone (FLONASE) 50 MCG/ACT nasal spray Place 2 sprays into both nostrils daily. 9.9 g 11   fluticasone-salmeterol (WIXELA INHUB) 250-50 MCG/ACT AEPB INHALE 1 PUFF INTO THE LUNGS TWICE DAILY 60 each 5   gabapentin (NEURONTIN) 300 MG capsule Take 1 capsule (300 mg total) by mouth at bedtime. 90 capsule 3   influenza vaccine adjuvanted (FLUAD) 0.5 ML injection Inject into the muscle. 0.5 mL 0   meloxicam (MOBIC) 15 MG tablet TAKE 1 TABLET(15 MG) BY MOUTH DAILY AS NEEDED FOR PAIN 90 tablet 1   Multiple Vitamin (MULTIVITAMIN) tablet Take 1 tablet by mouth daily.     omega-3 fish oil (MAXEPA) 1000 MG CAPS capsule Take 1 capsule by mouth daily.      polyethylene glycol (MIRALAX / GLYCOLAX) packet Take 17 g by mouth daily. (Patient taking differently: Take 17 g by mouth daily as needed.) 14 each 0   polyvinyl alcohol (LIQUIFILM TEARS) 1.4 % ophthalmic solution Place 1 drop into both eyes as needed for dry eyes.     traZODone (DESYREL) 100 MG tablet Take 1 tablet (100 mg total) by mouth daily as needed. Use for insomnia 90 tablet 3   UNABLE TO FIND Allergy shots weekly, Dr. Christell Constant.     No current facility-administered medications on file prior to visit.    Review of Systems:  As per HPI- otherwise  negative.   Physical Examination: There were no vitals filed for this visit. There were no vitals filed for this visit. There is no height or weight on file to calculate BMI. Ideal Body Weight:    GEN: no acute distress. HEENT: Atraumatic, Normocephalic.  Ears and Nose: No external deformity. CV: RRR, No M/G/R. No JVD. No thrill. No extra heart sounds. PULM: CTA B, no wheezes, crackles, rhonchi. No retractions. No resp. distress. No accessory muscle use. ABD: S, NT, ND, +BS.  No rebound. No HSM. EXTR: No c/c/e PSYCH: Normally interactive. Conversant.    Assessment and Plan: ***  Signed Abbe Amsterdam, MD

## 2023-04-24 ENCOUNTER — Ambulatory Visit: Payer: Medicare Other | Admitting: Family Medicine

## 2023-04-24 DIAGNOSIS — E785 Hyperlipidemia, unspecified: Secondary | ICD-10-CM

## 2023-04-24 DIAGNOSIS — Z131 Encounter for screening for diabetes mellitus: Secondary | ICD-10-CM

## 2023-04-24 DIAGNOSIS — Z1329 Encounter for screening for other suspected endocrine disorder: Secondary | ICD-10-CM

## 2023-04-24 DIAGNOSIS — Z130 Encounter for screening for diseases of the blood and blood-forming organs and certain disorders involving the immune mechanism: Secondary | ICD-10-CM

## 2023-04-24 DIAGNOSIS — H9190 Unspecified hearing loss, unspecified ear: Secondary | ICD-10-CM

## 2023-04-24 DIAGNOSIS — E871 Hypo-osmolality and hyponatremia: Secondary | ICD-10-CM

## 2023-04-24 DIAGNOSIS — M8580 Other specified disorders of bone density and structure, unspecified site: Secondary | ICD-10-CM

## 2023-04-24 DIAGNOSIS — J4489 Other specified chronic obstructive pulmonary disease: Secondary | ICD-10-CM

## 2023-04-24 DIAGNOSIS — J301 Allergic rhinitis due to pollen: Secondary | ICD-10-CM | POA: Diagnosis not present

## 2023-05-01 DIAGNOSIS — J301 Allergic rhinitis due to pollen: Secondary | ICD-10-CM | POA: Diagnosis not present

## 2023-05-06 ENCOUNTER — Telehealth: Payer: Self-pay | Admitting: Adult Health

## 2023-05-06 DIAGNOSIS — J301 Allergic rhinitis due to pollen: Secondary | ICD-10-CM | POA: Diagnosis not present

## 2023-05-06 MED ORDER — GABAPENTIN 300 MG PO CAPS: 300.0000 mg | ORAL_CAPSULE | Freq: Every day | ORAL | 1 refills | Status: DC

## 2023-05-06 NOTE — Telephone Encounter (Signed)
 Pt request refill for gabapentin (NEURONTIN) 300 MG capsule send to  [facility] #[id number] -

## 2023-05-06 NOTE — Telephone Encounter (Signed)
Refill sent until appt.

## 2023-05-09 DIAGNOSIS — J301 Allergic rhinitis due to pollen: Secondary | ICD-10-CM | POA: Diagnosis not present

## 2023-05-13 DIAGNOSIS — J301 Allergic rhinitis due to pollen: Secondary | ICD-10-CM | POA: Diagnosis not present

## 2023-05-14 NOTE — Patient Instructions (Addendum)
 It was great to see you again today, recommend dose of RSV vaccine at your pharmacy I will be in touch with your labs Please see me in about 6 months assuming all is well!

## 2023-05-14 NOTE — Progress Notes (Signed)
 [facility] [address] [place], [place] [address] [phone]  Date:  05/20/2023   Name:  Tanya Harris   DOB:  Dec 25, 1932   MRN:  [id number]  PCP:  Tanya Cables, MD    Chief Complaint: 6 month follow up (Concerns/ questions: discuss Tanya [facility]/AWV- overdue)   History of Present Illness:  Tanya Harris is a 89 y.o. very pleasant female patient who presents with the following:  Patient seen today for periodic follow-up.  Most recent visit with myself was in August  Older lady with history of COPD, hearing loss, osteopenia, hyperlipidemia, spinal stenosis, peripheral neuropathy, severe kyphosis  She no longer wishes to do mammograms DEXA scan completed 2021- she does not wish to do this  She has been married to her husband Tanya Harris for nearly 70 years.  They live in their own home, Tanya Harris does the driving- he will be 89 in 2 weeks.  He is still driving with no issues  Their son lives 10 minutes away  Their home is one level- few steps to the garage  No falls - she uses her walker  She notes alternative diarrhea and constipation- she will use miralax about once a week and this seems to help   Shingrix, flu, COVID up-to-date Recommend dose of RSV Labs: Done in August-CMP, lipid, CBC, A1c, TSH  Tanya [facility]- sh would like to stop using this  Tylenol 3 as needed Calcium and vitamin D Gabapentin 300 at bedtime Meloxicam- she uses twice a week fish oil Trazodone for sleep She is using allergy shots   Pt notes she was told she had eczema on the back of her neck in the past- this seems to have come back.  It can itch  She has tried a moisturizer and has a steroid cream on hand- offered to give her some cream but she declines for now- she has enough   She wants to use her exercise bike again  She declines PT right now, [facility]   Wt Readings from Last 3 Encounters:  05/20/23 124 lb 9.6  oz (56.5 kg)  10/25/22 123 lb (55.8 kg)  05/16/22 127 lb 12.8 oz (58 kg)    Patient Active Problem List   Diagnosis Date Noted   Scoliosis, levoconvex 05/04/2018   Strangulated left femoral hernia s/p lap SB resction & hernia repair 05/04/2018 05/04/2018   SBO (small bowel obstruction) (HCC) 05/04/2018   Fibroid uterus 05/04/2018   Abdominal wall asymmetry 05/04/2018   Diverticulosis of left colon 05/04/2018   Hyperglycemia 05/04/2018   Spinal stenosis    Rotator cuff tendinitis, left 01/03/2018   History of excessive cerumen 12/04/2017   Chronic pain of left knee 10/01/2017   Fall at home 05/03/2017   Lesion of skin of breast 12/12/2015   HOH (hard of hearing) 12/08/2014   Hyperlipidemia 06/04/2014   Buckle type impacted fracture of the distal left radial metaphysis 05/04/2014   Closed nondisplaced fracture of styloid process of left ulna 05/04/2014   Chronic allergic rhinitis 04/15/2014   COPD with chronic bronchitis (HCC) 11/02/2013   Osteopenia 11/02/2013   Gait instability 11/02/2013    Past Medical History:  Diagnosis Date   Buckle type impacted fracture of the distal left radial metaphysis 05/04/2014   Closed nondisplaced fracture of styloid process of left ulna 05/04/2014   Coccygeal contusion 05/02/2015   COPD (chronic obstructive pulmonary disease) (HCC)  Mild   DJD (degenerative joint disease) of knee    Fall at home 05/04/2018   Rotator cuff tendinitis, left 01/03/2018   Scoliosis    Spinal stenosis     Past Surgical History:  Procedure Laterality Date   APPENDECTOMY  2005   CATARACT EXTRACTION     Bilateral   LAPAROSCOPY N/A 05/04/2018   Procedure: LAPAROSCOPY DIAGNOSTIC , FEMORAL  HERNIA REPAIR WITH SMALL BOWEL RESECTION WITH TAP BLOCK;  Surgeon: Karie Soda, MD;  Location: [facility];  Service: General;  Laterality: N/A;   NASAL SINUS SURGERY     TONSILLECTOMY     TOTAL KNEE ARTHROPLASTY  2001   Left    Social History   Tobacco Use   Smoking status:  Never   Smokeless tobacco: Never  Vaping Use   Vaping status: Never Used  Substance Use Topics   Alcohol use: No   Drug use: Never    Family History  Problem Relation Age of Onset   Heart disease Mother 64       Deceased   Diabetes Mother    Heart disease Father 21       Deceased   Asthma Father    Heart disease Maternal Uncle    Colon cancer Brother    Diabetes Brother        #2   Heart disease Brother        x2   Healthy Sister    Healthy Brother        x1   Hyperlipidemia Son        x2    Allergies  Allergen Reactions   Ibuprofen Swelling    Mouth Swelling- however pt confirms that she is able to take mobic with no problems 11/2016    Amoxicillin Nausea And Vomiting    Did it involve swelling of the face/tongue/throat, SOB, or low BP? no Did it involve sudden or severe rash/hives, skin peeling, or any reaction on the inside of your mouth or nose? no Did you need to seek medical attention at a hospital or doctor's office? no  When did it last happen?      2005 If all above answers are "NO", may proceed with cephalosporin use.     Medication list has been reviewed and updated.  Current Outpatient Medications on File Prior to Visit  Medication Sig Dispense Refill   acetaminophen-codeine (TYLENOL #3) 300-30 MG tablet Take 1 tablet by mouth every 6 (six) hours as needed for moderate pain. 30 tablet 0   alendronate ([facility]) 70 MG tablet TAKE 1 TABLET(70 MG) BY MOUTH EVERY 7 DAYS WITH A FULL GLASS OF WATER AND ON AN EMPTY STOMACH 12 tablet 3   Calcium Carbonate-Vitamin D 600-400 MG-UNIT tablet Take 1 tablet by mouth daily. 800 units vitamin D     fluticasone (FLONASE) 50 MCG/ACT nasal spray Place 2 sprays into both nostrils daily. 9.9 g 11   fluticasone-salmeterol (WIXELA INHUB) 250-50 MCG/ACT AEPB INHALE 1 PUFF INTO THE LUNGS TWICE DAILY 60 each 5   gabapentin (NEURONTIN) 300 MG capsule Take 1 capsule (300 mg total) by mouth at bedtime. 90 capsule 1   meloxicam  (MOBIC) 15 MG tablet TAKE 1 TABLET(15 MG) BY MOUTH DAILY AS NEEDED FOR PAIN 90 tablet 1   Multiple Vitamin (MULTIVITAMIN) tablet Take 1 tablet by mouth daily.     polyethylene glycol (MIRALAX / GLYCOLAX) packet Take 17 g by mouth daily. (Patient taking differently: Take 17 g by mouth daily as needed.)  14 each 0   polyvinyl alcohol (LIQUIFILM TEARS) 1.4 % ophthalmic solution Place 1 drop into both eyes as needed for dry eyes.     traZODone (DESYREL) 100 MG tablet Take 1 tablet (100 mg total) by mouth daily as needed. Use for insomnia 90 tablet 3   UNABLE TO FIND Allergy shots weekly, Dr. Christell Constant.     No current facility-administered medications on file prior to visit.    Review of Systems: As per HPI- otherwise negative.    Physical Examination: Vitals:   05/20/23 0823  BP: 120/72  Pulse: 63  Resp: 18  Temp: 97.7 F (36.5 C)   Vitals:   05/20/23 0823  Weight: 124 lb 9.6 oz (56.5 kg)  Height: 5\' 1"  (1.549 m)   Body mass index is 23.54 kg/m. Ideal Body Weight: Weight in (lb) to have BMI = 25: 132  GEN: no acute distress. Appears her normal self Severe kyphosis/ scoliosis is stable  Uses a rolling walker  HEENT: Atraumatic, Normocephalic.  Ears and Nose: No external deformity. CV: RRR, No M/G/R. No JVD. No thrill. No extra heart sounds. PULM: CTA B, no wheezes, crackles, rhonchi. No retractions. No resp. distress. No accessory muscle use. ABD: S, NT, ND EXTR: No c/c/e PSYCH: Normally interactive. Conversant.    Assessment and Plan: Hyperlipidemia, unspecified hyperlipidemia type  COPD with chronic bronchitis (HCC) - Plan: fluticasone-salmeterol (WIXELA INHUB) 250-50 MCG/ACT AEPB  Osteopenia, unspecified location  Chronic pain of left knee - Plan: acetaminophen-codeine (TYLENOL #3) 300-30 MG tablet  Primary insomnia - Plan: traZODone (DESYREL) 100 MG tablet  Hyponatremia - Plan: Basic metabolic panel  Screening for deficiency anemia - Plan: CBC  Medication  monitoring encounter - Plan: CBC  Following up today- all stable.  Living independently with nearby family back-up Not driving any longer Recommend RSV Refilled medications She uses tylenol #3 on occasion for MSK pain- refilled  Recheck in 6 months- Will plan further follow- up pending labs.  Signed Abbe Amsterdam, MD  Received labs as below, message to patient Results for orders placed or performed in visit on 05/20/23  Basic metabolic panel   Collection Time: 05/20/23  8:55 AM  Result Value Ref Range   Sodium 135 135 - 145 mEq/L   Potassium 4.2 3.5 - 5.1 mEq/L   Chloride 98 96 - 112 mEq/L   CO2 30 19 - 32 mEq/L   Glucose, Bld 93 70 - 99 mg/dL   BUN 22 6 - 23 mg/dL   Creatinine, Ser 0.45 0.40 - 1.20 mg/dL   GFR 40.98 >11.91 mL/min   Calcium 9.9 8.4 - 10.5 mg/dL  CBC   Collection Time: 05/20/23  8:55 AM  Result Value Ref Range   WBC 5.0 4.0 - 10.5 K/uL   RBC 4.64 3.87 - 5.11 Mil/uL   Platelets 203.0 150.0 - 400.0 K/uL   Hemoglobin 14.6 12.0 - 15.0 g/dL   HCT 47.8 29.5 - 62.1 %   MCV 92.4 78.0 - 100.0 fl   MCHC 34.1 30.0 - 36.0 g/dL   RDW 30.8 65.7 - 84.6 %

## 2023-05-20 ENCOUNTER — Encounter: Payer: Self-pay | Admitting: Family Medicine

## 2023-05-20 ENCOUNTER — Ambulatory Visit (INDEPENDENT_AMBULATORY_CARE_PROVIDER_SITE_OTHER): Payer: Medicare Other | Admitting: Family Medicine

## 2023-05-20 VITALS — BP 120/72 | HR 63 | Temp 97.7°F | Resp 18 | Ht 61.0 in | Wt 124.6 lb

## 2023-05-20 DIAGNOSIS — E871 Hypo-osmolality and hyponatremia: Secondary | ICD-10-CM | POA: Diagnosis not present

## 2023-05-20 DIAGNOSIS — M25562 Pain in left knee: Secondary | ICD-10-CM

## 2023-05-20 DIAGNOSIS — G8929 Other chronic pain: Secondary | ICD-10-CM | POA: Diagnosis not present

## 2023-05-20 DIAGNOSIS — Z130 Encounter for screening for diseases of the blood and blood-forming organs and certain disorders involving the immune mechanism: Secondary | ICD-10-CM | POA: Diagnosis not present

## 2023-05-20 DIAGNOSIS — Z5181 Encounter for therapeutic drug level monitoring: Secondary | ICD-10-CM

## 2023-05-20 DIAGNOSIS — J301 Allergic rhinitis due to pollen: Secondary | ICD-10-CM | POA: Diagnosis not present

## 2023-05-20 DIAGNOSIS — M8580 Other specified disorders of bone density and structure, unspecified site: Secondary | ICD-10-CM

## 2023-05-20 DIAGNOSIS — J4489 Other specified chronic obstructive pulmonary disease: Secondary | ICD-10-CM

## 2023-05-20 DIAGNOSIS — F5101 Primary insomnia: Secondary | ICD-10-CM | POA: Diagnosis not present

## 2023-05-20 DIAGNOSIS — E785 Hyperlipidemia, unspecified: Secondary | ICD-10-CM | POA: Diagnosis not present

## 2023-05-20 LAB — BASIC METABOLIC PANEL
BUN: 22 mg/dL (ref 6–23)
CO2: 30 meq/L (ref 19–32)
Calcium: 9.9 mg/dL (ref 8.4–10.5)
Chloride: 98 meq/L (ref 96–112)
Creatinine, Ser: 0.43 mg/dL (ref 0.40–1.20)
GFR: 86.71 mL/min (ref >60.00)
Glucose, Bld: 93 mg/dL (ref 70–99)
Potassium: 4.2 meq/L (ref 3.5–5.1)
Sodium: 135 meq/L (ref 135–145)

## 2023-05-20 LAB — CBC
HCT: 42.8 % (ref 36.0–46.0)
Hemoglobin: 14.6 g/dL (ref 12.0–15.0)
MCHC: 34.1 g/dL (ref 30.0–36.0)
MCV: 92.4 fl (ref 78.0–100.0)
Platelets: 203 10*3/uL (ref 150.0–400.0)
RBC: 4.64 Mil/uL (ref 3.87–5.11)
RDW: 14.2 % (ref 11.5–15.5)
WBC: 5 10*3/uL (ref 4.0–10.5)

## 2023-05-20 MED ORDER — ACETAMINOPHEN-CODEINE 300-30 MG PO TABS: 1.0000 | ORAL_TABLET | Freq: Four times a day (QID) | ORAL | 1 refills | Status: DC | PRN

## 2023-05-20 MED ORDER — TRAZODONE HCL 100 MG PO TABS: 100.0000 mg | ORAL_TABLET | Freq: Every day | ORAL | 3 refills | Status: AC | PRN

## 2023-05-20 MED ORDER — FLUTICASONE-SALMETEROL 250-50 MCG/ACT IN AEPB: INHALATION_SPRAY | RESPIRATORY_TRACT | 5 refills | Status: DC

## 2023-05-27 DIAGNOSIS — J301 Allergic rhinitis due to pollen: Secondary | ICD-10-CM | POA: Diagnosis not present

## 2023-06-05 DIAGNOSIS — J301 Allergic rhinitis due to pollen: Secondary | ICD-10-CM | POA: Diagnosis not present

## 2023-06-06 ENCOUNTER — Other Ambulatory Visit (HOSPITAL_BASED_OUTPATIENT_CLINIC_OR_DEPARTMENT_OTHER): Payer: Self-pay

## 2023-06-06 MED ORDER — RSVPREF3 VAC RECOMB ADJUVANTED 120 MCG/0.5ML IM SUSR
0.5000 mL | Freq: Once | INTRAMUSCULAR | 0 refills | Status: AC
  Filled 2023-06-06: qty 0.5, 1d supply, fill #0

## 2023-06-12 DIAGNOSIS — J301 Allergic rhinitis due to pollen: Secondary | ICD-10-CM | POA: Diagnosis not present

## 2023-06-19 DIAGNOSIS — J301 Allergic rhinitis due to pollen: Secondary | ICD-10-CM | POA: Diagnosis not present

## 2023-06-24 DIAGNOSIS — J301 Allergic rhinitis due to pollen: Secondary | ICD-10-CM | POA: Diagnosis not present

## 2023-07-10 DIAGNOSIS — J301 Allergic rhinitis due to pollen: Secondary | ICD-10-CM | POA: Diagnosis not present

## 2023-07-15 DIAGNOSIS — J301 Allergic rhinitis due to pollen: Secondary | ICD-10-CM | POA: Diagnosis not present

## 2023-07-22 DIAGNOSIS — J301 Allergic rhinitis due to pollen: Secondary | ICD-10-CM | POA: Diagnosis not present

## 2023-07-31 DIAGNOSIS — J301 Allergic rhinitis due to pollen: Secondary | ICD-10-CM | POA: Diagnosis not present

## 2023-08-01 DIAGNOSIS — J301 Allergic rhinitis due to pollen: Secondary | ICD-10-CM | POA: Diagnosis not present

## 2023-08-05 DIAGNOSIS — J301 Allergic rhinitis due to pollen: Secondary | ICD-10-CM | POA: Diagnosis not present

## 2023-08-14 DIAGNOSIS — J301 Allergic rhinitis due to pollen: Secondary | ICD-10-CM | POA: Diagnosis not present

## 2023-08-21 DIAGNOSIS — J301 Allergic rhinitis due to pollen: Secondary | ICD-10-CM | POA: Diagnosis not present

## 2023-09-04 DIAGNOSIS — R490 Dysphonia: Secondary | ICD-10-CM | POA: Diagnosis not present

## 2023-09-04 DIAGNOSIS — J301 Allergic rhinitis due to pollen: Secondary | ICD-10-CM | POA: Diagnosis not present

## 2023-09-09 DIAGNOSIS — J301 Allergic rhinitis due to pollen: Secondary | ICD-10-CM | POA: Diagnosis not present

## 2023-09-12 DIAGNOSIS — J301 Allergic rhinitis due to pollen: Secondary | ICD-10-CM | POA: Diagnosis not present

## 2023-09-18 DIAGNOSIS — J301 Allergic rhinitis due to pollen: Secondary | ICD-10-CM | POA: Diagnosis not present

## 2023-09-30 DIAGNOSIS — J301 Allergic rhinitis due to pollen: Secondary | ICD-10-CM | POA: Diagnosis not present

## 2023-10-02 ENCOUNTER — Telehealth: Payer: Self-pay | Admitting: Adult Health

## 2023-10-02 NOTE — Telephone Encounter (Signed)
 MYC conf

## 2023-10-02 NOTE — Progress Notes (Addendum)
 [facility] [address] [place]. [place] [address] [phone]       FOLLOW UP VISIT NOTE  Ms. Evette Moose Date of Birth:  November 16, 1932 Medical Record Number:  [id number]   Referring MD: Charlie Dolores [facility] provider: Dr. Rosemarie  Reason for Referral: Neuropathy    Virtual Visit via Video Note  I connected with Evette Moose on 10/03/2023 at  7:45 AM EDT by a video enabled telemedicine application and verified that I am speaking with the correct person using two identifiers.  Location: Patient: at home Provider: in office   I discussed the limitations of evaluation and management by telemedicine and the availability of in person appointments. The patient expressed understanding and agreed to proceed.      HPI:  Update 10/03/2023 JM: Patient returns for overdue follow-up via MyChart video visit.  Remains on gabapentin  300 mg nightly with continued benefit.  Denies side effects. Has been experiencing some symptoms during the day and she questions starting daytime dosage.  No further questions or concerns at this time.     History provided for reference purposes only  Update 05/09/2022 JM: returns for 1 year f/u via MyChart video visit. Reports neuropathy has been stable since prior visit without any progression. Continues on gabapentin  300mg  nightly, tolerating well. Can have some difficulty in the evening but not overly bothersome. No questions or concerns today.   Update 05/24/2021 JM: Patient returns for 1 year follow-up regarding neuropathy and medication management.  She has been stable since prior visit with continued benefit of gabapentin , denies side effects.  Denies any progression or changes in her neuropathy which has continued to be worse in left leg than right leg. No concerns at this time.  Update 05/23/2020 JM: Ms. Sachs returns for 74-month follow up via telephone for neuropathy and medication refill   She has been doing well since prior  visit On gabapentin  300 mg nightly -tolerating without side effects Continued benefit of painful neuropathy symptoms  No concerns or questions at this time  Update 10/21/2019 JM: Ms. Rumler is being seen via virtual visit for follow-up regarding neuropathy. Currently on gabapentin  300 mg nightly tolerating dosage well and reports great benefit of neuropathy pain as well as improvement of her sleep at night Occasionally experiences increased pain in the evening or that will awaken her at night. She will use topical OTC cream with benefit Denies daytime symptoms No concerns at this time  Update 05/28/2019 JM: Ms. Mathurin is a 89 year old female who is being seen today, 05/28/2019, via virtual visit for follow-up regarding neuropathy.  At prior visit, Dr. Rosemarie recommended increasing Topamax  dosage to eventual 100 mg daily but patient unable to tolerate due to reports of increased shakiness and headaches therefore currently on 50 mg nightly.  Recommend trialing 50 mg twice daily but patient declines need of daytime use as symptoms are only present at night.  She denies benefit at current dosage.  Discussion at prior visit regarding initiation of pregabalin but she reports contacting her insurance company and will be expensive for her to trial.  She has not previously tried gabapentin .  Her gait and balance have been stable.  No further concerns at this time.  Update 01/21/2019 Dr. Rosemarie : She returns for follow-up after last visit 200 months ago.  She states she is tolerating Topamax  50 mg at night quite well without side effects but is not so sure that it is helping at all.  She does take trazodone  100 mg  which helps her sleep.  She is not as much bothered during the day with the paresthesias and does not take any medicine for that.  She does apply Aspercreme locally which helps only temporarily for couple of hours.  She used to find some benefit with pool therapy but she is no longer able to do that  because of knee pain.  She feels her back pain has improved and Dr. Karoline feels she does not need vertebroplasty anymore.  Patient did undergo neuropathy panel labs at last visit which were all normal except elevated ANA but she does not have any clinical symptomatology to suggest lupus.  This is likely a lab false positive.  EMG nerve conduction study done on 12/11/2018 confirmed axonal peripheral polyneuropathy.  She continues to have paresthesias in the feet but feels her gait and balance are okay she has had no falls or injuries.  She does use a wheeled walker at all times.   Initial visit 10/27/2018 Dr. Adelaida. Chester is a 89 year old pleasant Caucasian lady seen today for initial office consultation visit for neuropathy.  History is obtained from the patient and her husband and review of referral notes.  Patient states that she has had bilateral tingling numbness in her feet for greater than 5 years while she was living in [place]  and then moved to [place] .  Primary care physician told her that she had a peripheral neuropathy but she did not see a neurologist and did not have EMG nerve conduction studies done.  Over the years she feels the paresthesias seem to be getting worse they have more severe now and constant.  They seem to be more with.  Is having activity and at night.  Previously they used to be at the bottom of her feet but now they seem to have progressed to involve up to her cough.  She is also noticed that her balance is off she stumbles a lot and her too often catches when she is trying to walk fast.  She has had a few minor falls but none in the last 1 year.  She has been using a walker for the last 4 years but mostly due to degenerative arthritis in her back as well as still need left knee surgery.  She denies any history of bowel bladder dysfunction, severe back injury or fall.  She saw Dr. Bonner for pain in the low back radiating down the left leg.  X-ray of the lumbar spine were  obtained on 09/29/2018 which I personally reviewed which showed just osteopenia without any compression fracture.  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  She has not had any recent EMG nerve conduction study or MRI scan of the lumbar spine.  She has not had any lab work for reversible causes of neuropathy.  She denies history of diabetes, alcohol intake or exposure to toxic medications which can cause neuropathy.       PMH:  Past Medical History:  Diagnosis Date   Buckle type impacted fracture of the distal left radial metaphysis 05/04/2014   Closed nondisplaced fracture of styloid process of left ulna 05/04/2014   Coccygeal contusion 05/02/2015   COPD (chronic obstructive pulmonary disease) (HCC)    Mild   DJD (degenerative joint disease) of knee    Fall at home 05/04/2018   Rotator cuff tendinitis, left 01/03/2018   Scoliosis    Spinal stenosis     Social History:  Social History   Socioeconomic History  Marital status: Married    Spouse name: Not on file   Number of children: Not on file   Years of education: Not on file   Highest education level: Not on file  Occupational History   Not on file  Tobacco Use   Smoking status: Never   Smokeless tobacco: Never  Vaping Use   Vaping status: Never Used  Substance and Sexual Activity   Alcohol use: No   Drug use: Never   Sexual activity: Not on file  Other Topics Concern   Not on file  Social History Narrative   Moved from [place]  to [place] in 2015   Social Drivers of Health   Financial Resource Strain: Low Risk  (10/20/2022)   Overall Financial Resource Strain (CARDIA)    Difficulty of Paying Living Expenses: Not hard at all  Food Insecurity: No Food Insecurity (04/07/2021)   Hunger Vital Sign    Worried About Running Out of Food in the Last Year: Never true    Ran Out of Food in the Last Year: Never true  Transportation Needs: No Transportation Needs (04/07/2021)   PRAPARE -  Administrator, Civil Service (Medical): No    Lack of Transportation (Non-Medical): No  Physical Activity: Unknown (10/20/2022)   Exercise Vital Sign    Days of Exercise per Week: Patient declined    Minutes of Exercise per Session: Not on file  Stress: No Stress Concern Present (10/20/2022)   [employer] of Occupational Health - Occupational Stress Questionnaire    Feeling of Stress : Not at all  Social Connections: Unknown (10/20/2022)   Social Connection and Isolation Panel    Frequency of Communication with Friends and Family: Twice a week    Frequency of Social Gatherings with Friends and Family: Once a week    Attends Religious Services: Patient declined    Database administrator or Organizations: Yes    Attends Engineer, structural: More than 4 times per year    Marital Status: Married  Catering manager Violence: Not At Risk (04/07/2021)   Humiliation, Afraid, Rape, and Kick questionnaire    Fear of Current or Ex-Partner: No    Emotionally Abused: No    Physically Abused: No    Sexually Abused: No    Medications:   Current Outpatient Medications on File Prior to Visit  Medication Sig Dispense Refill   acetaminophen -codeine  (TYLENOL  #3) 300-30 MG tablet Take 1 tablet by mouth every 6 (six) hours as needed for moderate pain (pain score 4-6). 30 tablet 1   alendronate  (FOSAMAX ) 70 MG tablet TAKE 1 TABLET(70 MG) BY MOUTH EVERY 7 DAYS WITH A FULL GLASS OF WATER AND ON AN EMPTY STOMACH 12 tablet 3   Calcium  Carbonate-Vitamin D  600-400 MG-UNIT tablet Take 1 tablet by mouth daily. 800 units vitamin D      fluticasone  (FLONASE ) 50 MCG/ACT nasal spray Place 2 sprays into both nostrils daily. 9.9 g 11   fluticasone -salmeterol (WIXELA INHUB) 250-50 MCG/ACT AEPB INHALE 1 PUFF INTO THE LUNGS TWICE DAILY 60 each 5   gabapentin  (NEURONTIN ) 300 MG capsule Take 1 capsule (300 mg total) by mouth at bedtime. 90 capsule 1   meloxicam  (MOBIC ) 15 MG tablet TAKE 1 TABLET(15 MG)  BY MOUTH DAILY AS NEEDED FOR PAIN 90 tablet 1   Multiple Vitamin (MULTIVITAMIN) tablet Take 1 tablet by mouth daily.     polyethylene glycol (MIRALAX  / GLYCOLAX ) packet Take 17 g by mouth daily. (Patient taking differently: Take 17  g by mouth daily as needed.) 14 each 0   polyvinyl alcohol (LIQUIFILM TEARS) 1.4 % ophthalmic solution Place 1 drop into both eyes as needed for dry eyes.     traZODone  (DESYREL ) 100 MG tablet Take 1 tablet (100 mg total) by mouth daily as needed. Use for insomnia 90 tablet 3   UNABLE TO FIND Allergy shots weekly, Dr. Georgina.     No current facility-administered medications on file prior to visit.    Allergies:   Allergies  Allergen Reactions   Ibuprofen Swelling    Mouth Swelling- however pt confirms that she is able to take mobic  with no problems 11/2016    Amoxicillin Nausea And Vomiting    Did it involve swelling of the face/tongue/throat, SOB, or low BP? no Did it involve sudden or severe rash/hives, skin peeling, or any reaction on the inside of your mouth or nose? no Did you need to seek medical attention at a hospital or doctor's office? no  When did it last happen?      2005 If all above answers are "NO", may proceed with cephalosporin use.     Physical Exam N/A d/t visit type      ASSESSMENT/PLAN: Very pleasant 89 year old Caucasian lady with a longstanding history of bilateral lower extremity paresthesias and gait imbalance likely from longstanding peripheral neuropathy of undetermined etiology.  No benefit with topiramate  but has been stable and nonprogressive on gabapentin . Does have some bothersome symptoms during the daytime.     AXONAL NEUROPATHY -start gabapentin  100 mg morning and afternoon as needed - advised to call after 2-3 weeks if no benefit for dosage increase or sooner if difficulty tolerating -Continue gabapentin  300 mg nightly - 1 yr refill provided     Follow-up in 1 year via MyChart video visit or call earlier if  needed   CC:  Copland, Harlene BROCKS, MD    I personally spent a total of 20 minutes in the care of the patient today including preparing to see the patient, counseling and educating, placing orders, and documenting clinical information in the EHR.   CC:  Copland, Harlene BROCKS, MD   Harlene Bogaert, [facility]  [facility] [address] [place], [place] [address]  Phone [phone] Fax [phone] Note: This document was prepared with digital dictation and possible smart phrase technology. Any transcriptional errors that result from this process are unintentional.

## 2023-10-03 ENCOUNTER — Telehealth (INDEPENDENT_AMBULATORY_CARE_PROVIDER_SITE_OTHER): Admitting: Adult Health

## 2023-10-03 ENCOUNTER — Encounter: Payer: Self-pay | Admitting: Adult Health

## 2023-10-03 DIAGNOSIS — G6289 Other specified polyneuropathies: Secondary | ICD-10-CM | POA: Diagnosis not present

## 2023-10-03 MED ORDER — GABAPENTIN 100 MG PO CAPS: 100.0000 mg | ORAL_CAPSULE | Freq: Two times a day (BID) | ORAL | 11 refills | Status: AC

## 2023-10-03 MED ORDER — GABAPENTIN 300 MG PO CAPS: 300.0000 mg | ORAL_CAPSULE | Freq: Every day | ORAL | 4 refills | Status: AC

## 2023-10-03 NOTE — Patient Instructions (Signed)
 It was great to see you again today and happy belated birthday!   Recommend you start gabapentin  100 mg morning and afternoon as needed for daytime symptoms If you do not see much benefit in the next 2 to 3 weeks please let me know and we can discuss dosage increase  Continue gabapentin  300 mg nightly    Follow-up in 1 year via MyChart video visit or call earlier if needed

## 2023-10-09 DIAGNOSIS — J301 Allergic rhinitis due to pollen: Secondary | ICD-10-CM | POA: Diagnosis not present

## 2023-10-11 ENCOUNTER — Telehealth: Payer: Self-pay | Admitting: Family Medicine

## 2023-10-11 NOTE — Telephone Encounter (Signed)
 Forms have been placed in PCP folder for review

## 2023-10-11 NOTE — Telephone Encounter (Signed)
 Pts husband came in to get paper sign by pcp for pt. Please call pt when paper is ready to be picked up. Placed paper in pcps box.

## 2023-10-14 DIAGNOSIS — J301 Allergic rhinitis due to pollen: Secondary | ICD-10-CM | POA: Diagnosis not present

## 2023-10-23 DIAGNOSIS — J301 Allergic rhinitis due to pollen: Secondary | ICD-10-CM | POA: Diagnosis not present

## 2023-10-28 DIAGNOSIS — H02055 Trichiasis without entropian left lower eyelid: Secondary | ICD-10-CM | POA: Diagnosis not present

## 2023-10-28 DIAGNOSIS — H02825 Cysts of left lower eyelid: Secondary | ICD-10-CM | POA: Diagnosis not present

## 2023-10-28 DIAGNOSIS — H5712 Ocular pain, left eye: Secondary | ICD-10-CM | POA: Diagnosis not present

## 2023-10-28 DIAGNOSIS — H16142 Punctate keratitis, left eye: Secondary | ICD-10-CM | POA: Diagnosis not present

## 2023-11-06 DIAGNOSIS — J301 Allergic rhinitis due to pollen: Secondary | ICD-10-CM | POA: Diagnosis not present

## 2023-11-11 DIAGNOSIS — J301 Allergic rhinitis due to pollen: Secondary | ICD-10-CM | POA: Diagnosis not present

## 2023-11-13 DIAGNOSIS — H524 Presbyopia: Secondary | ICD-10-CM | POA: Diagnosis not present

## 2023-11-13 DIAGNOSIS — H02055 Trichiasis without entropian left lower eyelid: Secondary | ICD-10-CM | POA: Diagnosis not present

## 2023-11-13 DIAGNOSIS — H52223 Regular astigmatism, bilateral: Secondary | ICD-10-CM | POA: Diagnosis not present

## 2023-11-13 DIAGNOSIS — H02825 Cysts of left lower eyelid: Secondary | ICD-10-CM | POA: Diagnosis not present

## 2023-11-13 DIAGNOSIS — H5213 Myopia, bilateral: Secondary | ICD-10-CM | POA: Diagnosis not present

## 2023-11-13 DIAGNOSIS — H35363 Drusen (degenerative) of macula, bilateral: Secondary | ICD-10-CM | POA: Diagnosis not present

## 2023-11-13 DIAGNOSIS — H16142 Punctate keratitis, left eye: Secondary | ICD-10-CM | POA: Diagnosis not present

## 2023-11-15 NOTE — Progress Notes (Signed)
 [facility] [address], [address] [place], [place] [address] [phone]  Date:  11/20/2023   Name:  Tanya Harris   DOB:  Jul 09, 1932   MRN:  [id number]  PCP:  Watt Harlene BROCKS, MD    Chief Complaint: Follow-up (Pt states she has a problem with her speech onset several months /Trouble sleeping pts husband states pt did not sleep at all last night )   History of Present Illness:  Tanya Harris is a 89 y.o. very pleasant female patient who presents with the following:  Pt seen today for follow-up I last saw her in March  Older lady with history of COPD, hearing loss, osteopenia, hyperlipidemia, spinal stenosis, peripheral neuropathy, severe kyphosis   She has been married to her husband Deward for nearly 70 years.  They live in their own home, Deward does the driving with no issues. Their son lives 10 minutes away   Shingrix, RSV, pneumonia all UTD Flu- give today  Covid- recommended at pharmacy  Discussed the use of AI scribe software for clinical note transcription with the patient, who gave verbal consent to proceed.  History of Present Illness Tanya Harris is an 89 year old female who presents with worsening hoarseness and postnasal drainage. She is accompanied by Deward.  She has been experiencing  hoarseness over the past several months, occurring on most days, which she attributes to a sensation of postnasal drainage. No sore throat is present. She has been using Flonase  and Advair regularly, and a previous pulmonary doctor recommended Mucinex , which has provided some relief but has not resolved the issue.  She denies any history of smoking or alcohol use. She has an allergy to ibuprofen but tolerates meloxicam , which she takes twice a week for inflammation. No falls or ankle swelling.    Patient Active Problem List   Diagnosis Date Noted   Scoliosis, levoconvex 05/04/2018   Strangulated left femoral hernia s/p  lap SB resction & hernia repair 05/04/2018 05/04/2018   SBO (small bowel obstruction) (HCC) 05/04/2018   Fibroid uterus 05/04/2018   Abdominal wall asymmetry 05/04/2018   Diverticulosis of left colon 05/04/2018   Hyperglycemia 05/04/2018   Spinal stenosis    Rotator cuff tendinitis, left 01/03/2018   History of excessive cerumen 12/04/2017   Chronic pain of left knee 10/01/2017   Fall at home 05/03/2017   Lesion of skin of breast 12/12/2015   HOH (hard of hearing) 12/08/2014   Hyperlipidemia 06/04/2014   Buckle type impacted fracture of the distal left radial metaphysis 05/04/2014   Closed nondisplaced fracture of styloid process of left ulna 05/04/2014   Chronic allergic rhinitis 04/15/2014   COPD with chronic bronchitis (HCC) 11/02/2013   Osteopenia 11/02/2013   Gait instability 11/02/2013    Past Medical History:  Diagnosis Date   Buckle type impacted fracture of the distal left radial metaphysis 05/04/2014   Closed nondisplaced fracture of styloid process of left ulna 05/04/2014   Coccygeal contusion 05/02/2015   COPD (chronic obstructive pulmonary disease) (HCC)    Mild   DJD (degenerative joint disease) of knee    Fall at home 05/04/2018   Rotator cuff tendinitis, left 01/03/2018   Scoliosis    Spinal stenosis     Past Surgical History:  Procedure Laterality Date   APPENDECTOMY  2005   CATARACT EXTRACTION     Bilateral   LAPAROSCOPY N/A 05/04/2018   Procedure: LAPAROSCOPY DIAGNOSTIC , FEMORAL  HERNIA REPAIR  WITH SMALL BOWEL RESECTION WITH TAP BLOCK;  Surgeon: Sheldon Standing, MD;  Location: [facility];  Service: General;  Laterality: N/A;   NASAL SINUS SURGERY     TONSILLECTOMY     TOTAL KNEE ARTHROPLASTY  2001   Left    Social History   Tobacco Use   Smoking status: Never   Smokeless tobacco: Never  Vaping Use   Vaping status: Never Used  Substance Use Topics   Alcohol use: No   Drug use: Never    Family History  Problem Relation Age of Onset   Heart disease Mother  59       Deceased   Diabetes Mother    Heart disease Father 37       Deceased   Asthma Father    Heart disease Maternal Uncle    Colon cancer Brother    Diabetes Brother        #2   Heart disease Brother        x2   Healthy Sister    Healthy Brother        x1   Hyperlipidemia Son        x2    Allergies  Allergen Reactions   Ibuprofen Swelling    Mouth Swelling- however pt confirms that she is able to take mobic  with no problems 11/2016    Amoxicillin Nausea And Vomiting    Did it involve swelling of the face/tongue/throat, SOB, or low BP? no Did it involve sudden or severe rash/hives, skin peeling, or any reaction on the inside of your mouth or nose? no Did you need to seek medical attention at a hospital or doctor's office? no  When did it last happen?      2005 If all above answers are "NO", may proceed with cephalosporin use.     Medication list has been reviewed and updated.  Current Outpatient Medications on File Prior to Visit  Medication Sig Dispense Refill   Calcium  Carbonate-Vitamin D  600-400 MG-UNIT tablet Take 1 tablet by mouth daily. 800 units vitamin D      fluticasone -salmeterol (WIXELA INHUB) 250-50 MCG/ACT AEPB INHALE 1 PUFF INTO THE LUNGS TWICE DAILY 60 each 5   gabapentin  (NEURONTIN ) 100 MG capsule Take 1 capsule (100 mg total) by mouth 2 (two) times daily. 60 capsule 11   gabapentin  (NEURONTIN ) 300 MG capsule Take 1 capsule (300 mg total) by mouth at bedtime. 90 capsule 4   Multiple Vitamin (MULTIVITAMIN) tablet Take 1 tablet by mouth daily.     polyethylene glycol (MIRALAX  / GLYCOLAX ) packet Take 17 g by mouth daily. (Patient not taking: Reported on 11/20/2023) 14 each 0   polyvinyl alcohol (LIQUIFILM TEARS) 1.4 % ophthalmic solution Place 1 drop into both eyes as needed for dry eyes.     traZODone  (DESYREL ) 100 MG tablet Take 1 tablet (100 mg total) by mouth daily as needed. Use for insomnia 90 tablet 3   UNABLE TO FIND Allergy shots weekly, Dr. Georgina.      No current facility-administered medications on file prior to visit.    Review of Systems:  As per HPI- otherwise negative.   Physical Examination: Vitals:   11/20/23 0852  BP: 138/82  Pulse: 72  SpO2: 96%   Vitals:   11/20/23 0852  Weight: 122 lb (55.3 kg)  Height: 5' 1 (1.549 m)   Body mass index is 23.05 kg/m. Ideal Body Weight: Weight in (lb) to have BMI = 25: 132  GEN: no acute distress.  Petite elderly lady, looks well  HEENT: Atraumatic, Normocephalic.  Wearing hearing aids.  Oropharynx visually normal  Ears and Nose: No external deformity. CV: RRR, No M/G/R. No JVD. No thrill. No extra heart sounds. PULM: CTA B, no wheezes, crackles, rhonchi. No retractions. No resp. distress. No accessory muscle use. ABD: S, NT, ND, +BS. No rebound. No HSM. EXTR: No c/c/e PSYCH: Normally interactive. Conversant Using a rollator    Assessment and Plan: Hyperlipidemia, unspecified hyperlipidemia type - Plan: Lipid panel  COPD with chronic bronchitis (HCC)  Screening for deficiency anemia - Plan: CBC  Thyroid  disorder screening - Plan: CBC, TSH  Screening for diabetes mellitus - Plan: Comprehensive metabolic panel with GFR, Hemoglobin A1c  Chronic pain of left knee - Plan: acetaminophen -codeine  (TYLENOL  #3) 300-30 MG tablet  Osteopenia of forearm, unspecified laterality - Plan: alendronate  (FOSAMAX ) 70 MG tablet  Seasonal allergies - Plan: fluticasone  (FLONASE ) 50 MCG/ACT nasal spray  Primary osteoarthritis, unspecified site - Plan: meloxicam  (MOBIC ) 15 MG tablet  PND (post-nasal drip) - Plan: ipratropium (ATROVENT ) 0.03 % nasal spray  Medication monitoring encounter - Plan: CBC  Need for influenza vaccination - Plan: Flu vaccine HIGH DOSE PF(Fluzone Trivalent)  Assessment & Plan Chronic postnasal drip with hoarseness Chronic postnasal drip with hoarseness, possibly allergic. Partial relief with current treatment. - Prescribe Atrovent  nasal spray, two sprays  each side, up to three or four times a day as needed. - Continue Flonase  if needed. - Consider ENT referral if hoarseness persists by early October.  Chronic obstructive pulmonary disease (COPD) COPD on Wixela  Chronic left knee pain Chronic left knee pain managed with meloxicam . - Refill meloxicam  prescription. She uses just a couple of times a week. She also will use tylenol  3 if needed   General Health Maintenance Immunizations up to date except flu shot and COVID booster. - Administer flu shot today. - Recommend COVID booster this fall.  Signed Harlene Schroeder, MD

## 2023-11-15 NOTE — Patient Instructions (Addendum)
 Good to see you again today!  I will be in touch with your labs Assuming lal is well please see me in about 6 months  Recommend a covid booster this fall at your pharmacy , flu shot today!  Please try the Atrovent  nasal spray as needed for post- nasal drainage and hoarse voice.  You can use this 3-4x a day as needed, ok to continue the fluticasone  spray as well if you like  Please let me know if this is not resolving your hoarse voice in the next few weeks

## 2023-11-20 ENCOUNTER — Encounter: Payer: Self-pay | Admitting: Family Medicine

## 2023-11-20 ENCOUNTER — Ambulatory Visit: Admitting: Family Medicine

## 2023-11-20 ENCOUNTER — Other Ambulatory Visit (HOSPITAL_BASED_OUTPATIENT_CLINIC_OR_DEPARTMENT_OTHER): Payer: Self-pay

## 2023-11-20 VITALS — BP 138/82 | HR 72 | Ht 61.0 in | Wt 122.0 lb

## 2023-11-20 DIAGNOSIS — Z23 Encounter for immunization: Secondary | ICD-10-CM

## 2023-11-20 DIAGNOSIS — R0982 Postnasal drip: Secondary | ICD-10-CM | POA: Diagnosis not present

## 2023-11-20 DIAGNOSIS — M25562 Pain in left knee: Secondary | ICD-10-CM

## 2023-11-20 DIAGNOSIS — G8929 Other chronic pain: Secondary | ICD-10-CM

## 2023-11-20 DIAGNOSIS — Z1329 Encounter for screening for other suspected endocrine disorder: Secondary | ICD-10-CM | POA: Diagnosis not present

## 2023-11-20 DIAGNOSIS — Z5181 Encounter for therapeutic drug level monitoring: Secondary | ICD-10-CM | POA: Diagnosis not present

## 2023-11-20 DIAGNOSIS — Z131 Encounter for screening for diabetes mellitus: Secondary | ICD-10-CM

## 2023-11-20 DIAGNOSIS — E785 Hyperlipidemia, unspecified: Secondary | ICD-10-CM | POA: Diagnosis not present

## 2023-11-20 DIAGNOSIS — M1991 Primary osteoarthritis, unspecified site: Secondary | ICD-10-CM

## 2023-11-20 DIAGNOSIS — M85839 Other specified disorders of bone density and structure, unspecified forearm: Secondary | ICD-10-CM | POA: Diagnosis not present

## 2023-11-20 DIAGNOSIS — Z130 Encounter for screening for diseases of the blood and blood-forming organs and certain disorders involving the immune mechanism: Secondary | ICD-10-CM

## 2023-11-20 DIAGNOSIS — J302 Other seasonal allergic rhinitis: Secondary | ICD-10-CM | POA: Diagnosis not present

## 2023-11-20 DIAGNOSIS — J4489 Other specified chronic obstructive pulmonary disease: Secondary | ICD-10-CM

## 2023-11-20 MED ORDER — ALENDRONATE SODIUM 70 MG PO TABS: ORAL_TABLET | ORAL | 3 refills | Status: AC

## 2023-11-20 MED ORDER — FLUTICASONE PROPIONATE 50 MCG/ACT NA SUSP: 2.0000 | Freq: Every day | NASAL | 11 refills | Status: AC

## 2023-11-20 MED ORDER — IPRATROPIUM BROMIDE 0.03 % NA SOLN: 2.0000 | Freq: Three times a day (TID) | NASAL | 12 refills | Status: AC

## 2023-11-20 MED ORDER — MELOXICAM 15 MG PO TABS: ORAL_TABLET | ORAL | 1 refills | Status: AC

## 2023-11-20 MED ORDER — ACETAMINOPHEN-CODEINE 300-30 MG PO TABS: 1.0000 | ORAL_TABLET | Freq: Four times a day (QID) | ORAL | 1 refills | Status: AC | PRN

## 2023-11-20 MED ORDER — COMIRNATY 30 MCG/0.3ML IM SUSY
0.3000 mL | PREFILLED_SYRINGE | Freq: Once | INTRAMUSCULAR | 0 refills | Status: AC
  Filled 2023-11-20: qty 0.3, 1d supply, fill #0

## 2023-11-27 DIAGNOSIS — J301 Allergic rhinitis due to pollen: Secondary | ICD-10-CM | POA: Diagnosis not present

## 2023-12-02 DIAGNOSIS — J301 Allergic rhinitis due to pollen: Secondary | ICD-10-CM | POA: Diagnosis not present

## 2023-12-11 DIAGNOSIS — J301 Allergic rhinitis due to pollen: Secondary | ICD-10-CM | POA: Diagnosis not present

## 2023-12-16 DIAGNOSIS — J301 Allergic rhinitis due to pollen: Secondary | ICD-10-CM | POA: Diagnosis not present

## 2023-12-23 DIAGNOSIS — J301 Allergic rhinitis due to pollen: Secondary | ICD-10-CM | POA: Diagnosis not present

## 2023-12-26 DIAGNOSIS — J301 Allergic rhinitis due to pollen: Secondary | ICD-10-CM | POA: Diagnosis not present

## 2023-12-30 DIAGNOSIS — J301 Allergic rhinitis due to pollen: Secondary | ICD-10-CM | POA: Diagnosis not present

## 2024-01-06 DIAGNOSIS — J301 Allergic rhinitis due to pollen: Secondary | ICD-10-CM | POA: Diagnosis not present

## 2024-01-20 DIAGNOSIS — J301 Allergic rhinitis due to pollen: Secondary | ICD-10-CM | POA: Diagnosis not present

## 2024-03-18 ENCOUNTER — Other Ambulatory Visit: Payer: Self-pay | Admitting: Family Medicine

## 2024-03-18 DIAGNOSIS — J4489 Other specified chronic obstructive pulmonary disease: Secondary | ICD-10-CM

## 2024-05-20 ENCOUNTER — Ambulatory Visit: Admitting: Family Medicine
